# Patient Record
Sex: Male | Born: 1950
Health system: Southern US, Community
[De-identification: ages and names within clinical notes are randomized; demographics above are authoritative.]

## PROBLEM LIST (undated history)

## (undated) DIAGNOSIS — I482 Chronic atrial fibrillation, unspecified: Secondary | ICD-10-CM

## (undated) DIAGNOSIS — E663 Overweight: Secondary | ICD-10-CM

## (undated) DIAGNOSIS — M479 Spondylosis, unspecified: Secondary | ICD-10-CM

## (undated) DIAGNOSIS — M109 Gout, unspecified: Secondary | ICD-10-CM

## (undated) DIAGNOSIS — Z7901 Long term (current) use of anticoagulants: Secondary | ICD-10-CM

## (undated) DIAGNOSIS — E785 Hyperlipidemia, unspecified: Secondary | ICD-10-CM

## (undated) DIAGNOSIS — M1120 Other chondrocalcinosis, unspecified site: Secondary | ICD-10-CM

## (undated) HISTORY — DX: Overweight: E66.3

## (undated) HISTORY — DX: Spondylosis, unspecified: M47.9

## (undated) HISTORY — DX: Chronic atrial fibrillation, unspecified: I48.20

## (undated) HISTORY — DX: Other chondrocalcinosis, unspecified site: M11.20

## (undated) HISTORY — DX: Hyperlipidemia, unspecified: E78.5

## (undated) HISTORY — DX: Long term (current) use of anticoagulants: Z79.01

---

## 1999-12-11 DIAGNOSIS — I482 Chronic atrial fibrillation, unspecified: Secondary | ICD-10-CM

## 1999-12-11 HISTORY — DX: Chronic atrial fibrillation, unspecified: I48.20

## 2004-12-10 HISTORY — PX: COLONOSCOPY W/ POLYPECTOMY: SHX1380

## 2006-07-01 ENCOUNTER — Emergency Department (HOSPITAL_COMMUNITY): Admission: EM | Admit: 2006-07-01 | Discharge: 2006-07-01 | Payer: Self-pay | Admitting: Emergency Medicine

## 2006-07-05 ENCOUNTER — Ambulatory Visit (HOSPITAL_COMMUNITY): Admission: RE | Admit: 2006-07-05 | Discharge: 2006-07-05 | Payer: Self-pay | Admitting: Family Medicine

## 2007-07-15 ENCOUNTER — Ambulatory Visit: Payer: Self-pay | Admitting: Cardiology

## 2007-08-01 ENCOUNTER — Ambulatory Visit: Payer: Self-pay | Admitting: Cardiology

## 2007-08-01 ENCOUNTER — Ambulatory Visit (HOSPITAL_COMMUNITY): Admission: RE | Admit: 2007-08-01 | Discharge: 2007-08-01 | Payer: Self-pay | Admitting: Cardiology

## 2007-08-22 ENCOUNTER — Ambulatory Visit: Payer: Self-pay | Admitting: Cardiology

## 2008-04-21 ENCOUNTER — Ambulatory Visit: Payer: Self-pay | Admitting: Cardiology

## 2008-04-21 ENCOUNTER — Ambulatory Visit (HOSPITAL_COMMUNITY): Admission: RE | Admit: 2008-04-21 | Discharge: 2008-04-21 | Payer: Self-pay | Admitting: Cardiology

## 2008-04-26 ENCOUNTER — Ambulatory Visit: Payer: Self-pay | Admitting: Internal Medicine

## 2009-04-14 ENCOUNTER — Ambulatory Visit: Payer: Self-pay | Admitting: Cardiology

## 2009-04-18 ENCOUNTER — Ambulatory Visit (HOSPITAL_COMMUNITY): Admission: RE | Admit: 2009-04-18 | Discharge: 2009-04-18 | Payer: Self-pay | Admitting: Cardiology

## 2009-04-22 ENCOUNTER — Ambulatory Visit: Payer: Self-pay | Admitting: Cardiology

## 2009-05-05 ENCOUNTER — Ambulatory Visit: Payer: Self-pay | Admitting: Cardiology

## 2009-06-09 ENCOUNTER — Encounter: Payer: Self-pay | Admitting: Cardiology

## 2009-07-04 ENCOUNTER — Encounter: Payer: Self-pay | Admitting: Cardiology

## 2009-07-04 LAB — CONVERTED CEMR LAB
INR: 2.2 — ABNORMAL HIGH (ref 0.0–1.5)
Prothrombin Time: 25.5 s — ABNORMAL HIGH (ref 11.6–15.2)

## 2009-07-25 ENCOUNTER — Encounter: Payer: Self-pay | Admitting: *Deleted

## 2009-08-01 ENCOUNTER — Encounter (INDEPENDENT_AMBULATORY_CARE_PROVIDER_SITE_OTHER): Payer: Self-pay | Admitting: *Deleted

## 2009-08-01 LAB — CONVERTED CEMR LAB
OCCULT 1: NEGATIVE
OCCULT 2: NEGATIVE
OCCULT 3: NEGATIVE

## 2009-08-22 ENCOUNTER — Encounter: Payer: Self-pay | Admitting: Cardiology

## 2009-08-22 LAB — CONVERTED CEMR LAB: INR: 2.9 — ABNORMAL HIGH (ref 0.0–1.5)

## 2009-08-23 ENCOUNTER — Encounter: Payer: Self-pay | Admitting: Cardiology

## 2009-08-23 LAB — CONVERTED CEMR LAB: POC INR: 2.9

## 2009-08-24 ENCOUNTER — Encounter: Payer: Self-pay | Admitting: Cardiology

## 2009-09-06 ENCOUNTER — Encounter: Payer: Self-pay | Admitting: Cardiology

## 2009-09-12 ENCOUNTER — Encounter: Payer: Self-pay | Admitting: Cardiology

## 2009-09-14 ENCOUNTER — Encounter (INDEPENDENT_AMBULATORY_CARE_PROVIDER_SITE_OTHER): Payer: Self-pay | Admitting: *Deleted

## 2009-09-14 ENCOUNTER — Encounter: Payer: Self-pay | Admitting: Cardiology

## 2009-09-14 ENCOUNTER — Telehealth: Payer: Self-pay | Admitting: Cardiology

## 2009-09-14 LAB — CONVERTED CEMR LAB
ALT: 19 units/L
AST: 25 units/L
Albumin: 4.4 g/dL (ref 3.5–5.2)
Alkaline Phosphatase: 56 units/L
Alkaline Phosphatase: 56 units/L (ref 39–117)
Bilirubin, Direct: 0.2 mg/dL
Bilirubin, Direct: 0.2 mg/dL (ref 0.0–0.3)
Cholesterol: 175 mg/dL (ref 0–200)
HDL: 44 mg/dL
HDL: 44 mg/dL (ref >39)
INR: 2.7 — ABNORMAL HIGH (ref 0.0–1.5)
INR: 2.7
LDL Cholesterol: 109 mg/dL
LDL Cholesterol: 109 mg/dL — ABNORMAL HIGH (ref 0–99)
Prothrombin Time: 28.9 s
Total CHOL/HDL Ratio: 4
Triglycerides: 112 mg/dL (ref <150)
VLDL: 22 mg/dL (ref 0–40)

## 2009-10-25 ENCOUNTER — Encounter (INDEPENDENT_AMBULATORY_CARE_PROVIDER_SITE_OTHER): Payer: Self-pay | Admitting: *Deleted

## 2009-10-25 ENCOUNTER — Encounter: Payer: Self-pay | Admitting: Cardiology

## 2009-10-25 ENCOUNTER — Telehealth: Payer: Self-pay | Admitting: Cardiology

## 2009-10-25 LAB — CONVERTED CEMR LAB: Prothrombin Time: 27.8 s — ABNORMAL HIGH (ref 11.6–15.2)

## 2009-12-16 ENCOUNTER — Encounter: Payer: Self-pay | Admitting: Cardiology

## 2009-12-16 LAB — CONVERTED CEMR LAB: INR: 2.67 — ABNORMAL HIGH (ref <1.50)

## 2009-12-19 ENCOUNTER — Telehealth: Payer: Self-pay | Admitting: Cardiology

## 2009-12-19 ENCOUNTER — Encounter: Payer: Self-pay | Admitting: Cardiology

## 2010-01-19 LAB — CONVERTED CEMR LAB: INR: 2.68 — ABNORMAL HIGH (ref <1.50)

## 2010-01-20 ENCOUNTER — Encounter: Payer: Self-pay | Admitting: Cardiology

## 2010-01-20 ENCOUNTER — Telehealth: Payer: Self-pay | Admitting: Cardiology

## 2010-02-05 ENCOUNTER — Emergency Department (HOSPITAL_COMMUNITY): Admission: EM | Admit: 2010-02-05 | Discharge: 2010-02-05 | Payer: Self-pay | Admitting: Emergency Medicine

## 2010-02-07 ENCOUNTER — Ambulatory Visit (HOSPITAL_COMMUNITY)
Admission: RE | Admit: 2010-02-07 | Discharge: 2010-02-07 | Payer: Self-pay | Source: Home / Self Care | Admitting: Internal Medicine

## 2010-02-16 ENCOUNTER — Encounter (INDEPENDENT_AMBULATORY_CARE_PROVIDER_SITE_OTHER): Payer: Self-pay | Admitting: *Deleted

## 2010-02-17 ENCOUNTER — Encounter: Payer: Self-pay | Admitting: Adult Health

## 2010-02-17 ENCOUNTER — Ambulatory Visit: Payer: Self-pay | Admitting: Cardiology

## 2010-02-17 DIAGNOSIS — F411 Generalized anxiety disorder: Secondary | ICD-10-CM | POA: Insufficient documentation

## 2010-04-06 ENCOUNTER — Encounter (INDEPENDENT_AMBULATORY_CARE_PROVIDER_SITE_OTHER): Payer: Self-pay | Admitting: Pharmacist

## 2010-04-21 ENCOUNTER — Encounter: Payer: Self-pay | Admitting: *Deleted

## 2010-04-21 ENCOUNTER — Encounter: Payer: Self-pay | Admitting: Cardiology

## 2010-04-21 ENCOUNTER — Telehealth (INDEPENDENT_AMBULATORY_CARE_PROVIDER_SITE_OTHER): Payer: Self-pay | Admitting: *Deleted

## 2010-04-21 LAB — CONVERTED CEMR LAB
INR: 3.42 — ABNORMAL HIGH (ref <1.50)
INR: 3.42

## 2010-06-08 ENCOUNTER — Encounter (INDEPENDENT_AMBULATORY_CARE_PROVIDER_SITE_OTHER): Payer: Self-pay | Admitting: Pharmacist

## 2010-06-19 ENCOUNTER — Encounter: Payer: Self-pay | Admitting: Cardiology

## 2010-06-19 ENCOUNTER — Telehealth: Payer: Self-pay | Admitting: Cardiology

## 2010-06-19 LAB — CONVERTED CEMR LAB
INR: 2.87 — ABNORMAL HIGH (ref <1.50)
INR: 2.87
Prothrombin Time: 30.5 s — ABNORMAL HIGH (ref 11.6–15.2)

## 2010-08-17 ENCOUNTER — Encounter: Payer: Self-pay | Admitting: Cardiology

## 2010-08-17 ENCOUNTER — Telehealth: Payer: Self-pay | Admitting: Cardiology

## 2010-08-17 LAB — CONVERTED CEMR LAB: Prothrombin Time: 23.2 s

## 2010-08-31 ENCOUNTER — Encounter: Payer: Self-pay | Admitting: Adult Health

## 2010-08-31 ENCOUNTER — Ambulatory Visit: Payer: Self-pay | Admitting: Cardiology

## 2010-09-18 ENCOUNTER — Telehealth (INDEPENDENT_AMBULATORY_CARE_PROVIDER_SITE_OTHER): Payer: Self-pay

## 2010-10-31 LAB — CONVERTED CEMR LAB: INR: 3.05 — ABNORMAL HIGH (ref <1.50)

## 2010-11-03 ENCOUNTER — Encounter: Payer: Self-pay | Admitting: Cardiology

## 2010-11-03 LAB — CONVERTED CEMR LAB: POC INR: 3.05

## 2010-12-28 LAB — CONVERTED CEMR LAB
INR: 2.59 — ABNORMAL HIGH (ref <1.50)
Prothrombin Time: 27.9 s — ABNORMAL HIGH (ref 11.6–15.2)

## 2010-12-29 ENCOUNTER — Encounter: Payer: Self-pay | Admitting: Cardiology

## 2010-12-29 ENCOUNTER — Telehealth: Payer: Self-pay | Admitting: Cardiology

## 2011-01-01 ENCOUNTER — Ambulatory Visit (HOSPITAL_COMMUNITY)
Admission: RE | Admit: 2011-01-01 | Discharge: 2011-01-01 | Payer: Self-pay | Source: Home / Self Care | Attending: Cardiology | Admitting: Cardiology

## 2011-01-01 ENCOUNTER — Ambulatory Visit
Admission: RE | Admit: 2011-01-01 | Discharge: 2011-01-01 | Payer: Self-pay | Source: Home / Self Care | Attending: Adult Health | Admitting: Adult Health

## 2011-01-02 ENCOUNTER — Encounter: Payer: Self-pay | Admitting: Adult Health

## 2011-01-05 ENCOUNTER — Encounter (INDEPENDENT_AMBULATORY_CARE_PROVIDER_SITE_OTHER): Payer: Self-pay | Admitting: *Deleted

## 2011-01-05 LAB — CONVERTED CEMR LAB
ALT: 19 units/L
Albumin: 4.3 g/dL
Calcium: 9.3 mg/dL
Chloride: 105 meq/L
Cholesterol: 163 mg/dL
Potassium: 4.3 meq/L
Triglycerides: 141 mg/dL

## 2011-01-08 LAB — CONVERTED CEMR LAB
AST: 20 units/L (ref 0–37)
Albumin: 4.3 g/dL (ref 3.5–5.2)
Alkaline Phosphatase: 67 units/L (ref 39–117)
CO2: 26 meq/L (ref 19–32)
Chloride: 105 meq/L (ref 96–112)
Creatinine, Ser: 0.87 mg/dL (ref 0.40–1.50)
Glucose, Bld: 99 mg/dL (ref 70–99)
LDL Cholesterol: 102 mg/dL — ABNORMAL HIGH (ref 0–99)
Magnesium: 1.8 mg/dL (ref 1.5–2.5)
Potassium: 4.3 meq/L (ref 3.5–5.3)
Total Bilirubin: 0.9 mg/dL (ref 0.3–1.2)
Total CHOL/HDL Ratio: 4.9
Total Protein: 7 g/dL (ref 6.0–8.3)

## 2011-01-09 NOTE — Progress Notes (Signed)
Summary: coumadin management  Phone Note Outgoing Call   Call placed by: Vashti Hey RN Call placed to: Patient Reason for Call: Discuss lab or test results Summary of Call: Received fax from [facility] with results of PT/INR obtained on pt today.  PT 34.2  INR 3.42  Pt states he was on prednisone x 7 days for gout and percocet x 7 days after that for his back.  Is off both meds now.  Pt has already taken coumadin today.  Pt will hold coumadin tomorrow then resume 2.5mg  once daily except 5mg  on M,W,F.  He will recheck INR at [facility] week of 05/15/10. Initial call taken by: Vashti Hey RN,  Apr 21, 2010 3:47 PM     Anticoagulant Therapy  Managed by: Vashti Hey, RN PCP: Osborne Casco Supervising MD: Myrtis Ser MD, Tinnie Gens Indication 1: Atrial Fibrillation (ICD-427.31) Lab Used: [facility] Site: [place] PT 34.2  Dietary changes: no    Health status changes: no    Bleeding/hemorrhagic complications: no    Recent/future hospitalizations: no    Any changes in medication regimen? yes       Details: been on prednisone x 7 days for gout and percocet x 7 days for back  Recent/future dental: no  Any missed doses?: no       Is patient compliant with meds? yes         Anticoagulation Management History:      The patient is taking warfarin and comes in today for a routine follow up visit.  Negative risk factors for bleeding include an age less than 39 years old.  The bleeding index is 'low risk'.  Negative CHADS2 values include Age > 75 years old.  The start date was 12/11/1999.  His last INR was 2.68 and today's INR is 3.42.  Prothrombin time is 34.2.  Anticoagulation responsible provider: Myrtis Ser MD, Tinnie Gens.  Cuvette Lot#: [id number].    Anticoagulation Management Assessment/Plan:      The patient's current anticoagulation dose is Warfarin sodium 5 mg tabs: Use as directed by Anticoagulation Clinic.1 tablet by mouth daily.  The target INR is 2 - 3.  The next INR is due 05/15/2010.   Anticoagulation instructions were given to patient.  Results were reviewed/authorized by Vashti Hey, RN.  He was notified by Vashti Hey RN.         Prior Anticoagulation Instructions: Received fax from [facility] with results of PT/INR obtained on pt 01/19/10.  PT 28.3  INR 2.68  Pt told to continue coumadin 2.5mg  once daily except 5mg  on M,W,F and have INR rechecked at lab week of 02/20/10.  Pt verbalized understanding.  Current Anticoagulation Instructions: Received fax from [facility] with results of PT/INR obtained on pt today.  PT 34.2  INR 3.42  Pt states he was on prednisone x 7 days for gout and percocet x 7 days after that for his back.  Is off both meds now.  Pt has already taken coumadin today.  Pt will hold coumadin tomorrow then resume 2.5mg  once daily except 5mg  on M,W,F.  He will recheck INR at [facility] week of 05/15/10.

## 2011-01-09 NOTE — Progress Notes (Signed)
Summary: coumadin management  Phone Note Outgoing Call   Call placed by: Vashti Hey RN,  August 17, 2010 4:33 PM Call placed to: Patient Reason for Call: Discuss lab or test results Summary of Call: Received fax from [facility] with results of PT/INR obtained on pt today.  PT 23.2  INR 2.04  Order gievn for pt to take coumadin 5mg  tonight then resume 2.5mg  once daily except 5mg  on M,W,F.  Recheck INR week of 09/18/10 at [facility].  Pt verbalized understanding.     Anticoagulant Therapy  Managed by: Vashti Hey, RN PCP: Osborne Casco Supervising MD: Dietrich Pates MD, Molly Maduro Indication 1: Atrial Fibrillation (ICD-427.31) Lab Used: [facility] Site: [place] PT 23.2  Dietary changes: no    Health status changes: no    Bleeding/hemorrhagic complications: no    Recent/future hospitalizations: no    Any changes in medication regimen? no    Recent/future dental: no  Any missed doses?: no       Is patient compliant with meds? yes         Anticoagulation Management History:      His anticoagulation is being managed by telephone today.  Negative risk factors for bleeding include an age less than 21 years old.  The bleeding index is 'low risk'.  Negative CHADS2 values include Age > 71 years old.  The start date was 12/11/1999.  His last INR was 2.87 and today's INR is 2.04.  Prothrombin time is 23.2.  Anticoagulation responsible provider: Dietrich Pates MD, Molly Maduro.  Cuvette Lot#: [id number].    Anticoagulation Management Assessment/Plan:      The patient's current anticoagulation dose is Warfarin sodium 5 mg tabs: Use as directed by Anticoagulation Clinic.1 tablet by mouth daily.  The target INR is 2 - 3.  The next INR is due 09/18/2010.  Anticoagulation instructions were given to patient.  Results were reviewed/authorized by Vashti Hey, RN.  He was notified by Vashti Hey RN.         Prior Anticoagulation Instructions: Received fax from [place] with results of PT/INR obtained on pt today.  PT  30.5  INR 2.87  Pt told to continue coumadin 2.5mg  once daily except 5mg  on M,W,F and recheck INR week of 07/17/10 at [facility].  Current Anticoagulation Instructions: Received fax from [facility] with results of PT/INR obtained on pt today.  PT 23.2  INR 2.04  Order gievn for pt to take coumadin 5mg  tonight then resume 2.5mg  once daily except 5mg  on M,W,F.  Recheck INR week of 09/18/10 at [facility].  Pt verbalized understanding.

## 2011-01-09 NOTE — Miscellaneous (Signed)
Summary: labs lipids,liver,09/14/2009  Clinical Lists Changes  Observations: Added new observation of ALBUMIN: 4.4 g/dL (84/69/6295 28:41) Added new observation of PROTEIN, TOT: 7.2 g/dL (32/44/0102 72:53) Added new observation of SGPT (ALT): 19 units/L (09/14/2009 16:00) Added new observation of SGOT (AST): 25 units/L (09/14/2009 16:00) Added new observation of ALK PHOS: 56 units/L (09/14/2009 16:00) Added new observation of BILI DIRECT: 0.2 mg/dL (66/44/0347 42:59) Added new observation of LDL: 109 mg/dL (56/38/7564 33:29) Added new observation of HDL: 44 mg/dL (51/88/4166 06:30) Added new observation of TRIGLYC TOT: 112 mg/dL (16/12/930 35:57) Added new observation of CHOLESTEROL: 175 mg/dL (32/20/2542 70:62)

## 2011-01-09 NOTE — Medication Information (Signed)
Summary: Coumadin Clinic   Anticoagulant Therapy  Managed by: Weston Brass, PharmD PCP: Osborne Casco Supervising MD: Myrtis Ser MD, Tinnie Gens Indication 1: Atrial Fibrillation (ICD-427.31) Lab Used: [facility] [place] Site: [place] INR POC 3.05 INR RANGE 2.0-3.0  Dietary changes: yes       Details: had extra wine night before INR drawn.  Health status changes: no    Bleeding/hemorrhagic complications: no    Recent/future hospitalizations: no    Any changes in medication regimen? yes       Details: taking extra IBU this week  Recent/future dental: no  Any missed doses?: no       Is patient compliant with meds? yes      Comments: INR drawn on 11/22.  Results received in CC on 11/25.   Allergies: No Known Drug Allergies  Anticoagulation Management History:      His anticoagulation is being managed by telephone today.  Negative risk factors for bleeding include an age less than 62 years old.  The bleeding index is 'low risk'.  Negative CHADS2 values include Age > 66 years old.  The start date was 12/11/1999.  His last INR was 3.05.  Anticoagulation responsible provider: Myrtis Ser MD, Tinnie Gens.  INR POC: 3.05.    Anticoagulation Management Assessment/Plan:      The patient's current anticoagulation dose is Warfarin sodium 5 mg tabs: Use as directed by Anticoagulation Clinic.1 tablet by mouth daily.  The target INR is 2 - 3.  The next INR is due 11/27/2010.  Anticoagulation instructions were given to patient.  Results were reviewed/authorized by Weston Brass, PharmD.  He was notified by Weston Brass PharmD.         Prior Anticoagulation Instructions: Received fax from [place] with results of PT/INR obtained on pt today.  PT 23.2  INR 2.04  Order gievn for pt to take coumadin 5mg  tonight then resume 2.5mg  once daily except 5mg  on M,W,F.  Recheck INR week of 09/18/10 at [facility].  Pt verbalized understanding.  Current Anticoagulation Instructions: INR 3.05  Spoke with pt.  Continue same dose of  alternating 2.5mg  and 5mg  every other day.  Recheck INR week of 11/27/10.

## 2011-01-09 NOTE — Assessment & Plan Note (Signed)
Summary: PER PT REQUEST/STATES DR.FAGAN WANTS HIM SEEN/TG  Medications Added PERCOCET 5-325 MG TABS (OXYCODONE-ACETAMINOPHEN) take as needed for pain ALPRAZOLAM 0.25 MG TABS (ALPRAZOLAM) take one tablet by mouth twice daily as needed for anxiety      Allergies Added: NKDA  Visit Type:  Follow-up Primary Provider:  Osborne Casco  CC:  no complaints today.  History of Present Illness: [name] is a friendly, talkative CM we are following for longstanding history of Atrial fibrillaion.  He was being seen on a yearly basis for CBL assessment and clearance, but has now retired.  He continues to see [place] yearly secondary to his arrythmia.  Takes comadin and medications as directed and has [facility] [place].  He states that he recently injured his back by over exertion and had MRI showing bulging discs not requiring surgery at this time.  He continues some soreness and pain down his L leg on occasion.  He has been less active as a result of this.  He cares for an invalid wife and is often very stressed by this as she is full-time care for progressive Parkasins disease.  He states he is not sleeping well at night and request a low dose of antianxiety.  He tried a "friend's" xanax .50mg  and he states that it was helpful to him. He is requesting this for short term. He denies bleeding, SOB or dizziness.    Preventive Screening-Counseling & Management  Alcohol-Tobacco     Alcohol drinks/day: 0  Current Medications (verified): 1)  Warfarin Sodium 5 Mg Tabs (Warfarin Sodium) .... Use As Directed By Anticoagulation Clinic.1 Tablet By Mouth Daily 2)  Simvastatin 40 Mg Tabs (Simvastatin) .... Take One Tablet By Mouth Daily At Bedtime 3)  Atenolol 50 Mg Tabs (Atenolol) .... Take One Tablet By Mouth Daily 4)  Percocet 5-325 Mg Tabs (Oxycodone-Acetaminophen) .... Take As Needed For Pain 5)  Alprazolam 0.25 Mg Tabs (Alprazolam) .... Take One Tablet By Mouth Twice Daily  As Needed For Anxiety  Allergies (verified): No Known Drug Allergies PMH-FH-SH reviewed-no changes except otherwise noted  Social History: Alcohol drinks/day:  0  Review of Systems       Sleepleness and anxiety. Chronic back pain. All other systems have been reviewed and are negative unless stated above.   Vital Signs:  Patient profile:   60 year old male Height:      66 inches Weight:      244 pounds BMI:     39.52 Pulse rate:   93 / minute BP sitting:   110 / 77  (right arm)  Vitals Entered By: Dreama Saa, CNA (February 17, 2010 2:03 PM)  Physical Exam  General:  normal appearance and obese.   Head:  normocephalic and atraumatic Eyes:  Wears glasses Ears:  TM's intact and clear with normal canals and hearing Mouth:  Teeth, gums and palate normal. Oral mucosa normal. Lungs:  CTA Heart:  Irregular without MRG Abdomen:  Obese, NT 2+ BS Msk:  Back normal, normal gait. Muscle strength and tone normal. Extremities:  Some radiculopathy with over exertion from back pain. Neurologic:  Alert and oriented x 3. Psych:  anxious and hyperactive.     EKG  Procedure date:  02/17/2010  Findings:      Atrial fibrillation with a controlled ventricular response rate of: 91bpm  Impression & Recommendations:  Problem # 1:  ATRIAL FIBRILLATION (ICD-427.31) He continues on atenolol as directed.  His HR is a little  fast today, but he denies palpatations or racing heart.  He says he is out of shape and wants to exercise more, as his back heals.  He continues on coumadin with monthly checks of INR.  He will continue same.  I will see him in 6 months for follow-up His updated medication list for this problem includes:    Warfarin Sodium 5 Mg Tabs (Warfarin sodium) ..... Use as directed by anticoagulation clinic.1 tablet by mouth daily    Atenolol 50 Mg Tabs (Atenolol) .Marland Kitchen... Take one tablet by mouth daily  Problem # 2:  DYSLIPIDEMIA (ICD-272.4) Fasting lipids and LFT's will be completed  with next blood draw.  His updated medication list for this problem includes:    Simvastatin 40 Mg Tabs (Simvastatin) .Marland Kitchen... Take one tablet by mouth daily at bedtime  Orders: T-Lipid Profile [phone]) T-Renal Function Panel [phone])  Problem # 3:  ANXIETY (ICD-300.00) I have given him a low dose of xanax 0.25mg  two times a day and is advised to follow-up with primary care provider for refills.  TSH will be checked with next blood draw.  He is encouraged to exercise daily which will assist with relaxation and decrease anxiety.  Patient Instructions: 1)  Your physician recommends that you schedule a follow-up appointment in: 6 months 2)  Your physician recommends that you return for lab work in: with next scheduled lab draw for pt/inr 3)  Your physician has recommended you make the following change in your medication: xanax Take 1 tablet by mouth two times a day 4)  as needed for anxiety Prescriptions: ALPRAZOLAM 0.25 MG TABS (ALPRAZOLAM) take one tablet by mouth twice daily as needed for anxiety  #45 x 2   Entered by:   Teressa Lower RN   Authorized by:   Joni Reining, NP   Signed by:   Teressa Lower RN on 02/17/2010   Method used:   Print then Give to Patient   RxID:   [id number]

## 2011-01-09 NOTE — Assessment & Plan Note (Signed)
Summary: 6 mth f/u per checkout on 02/17/10/tg  Medications Added RA FISH OIL 1000 MG CAPS (OMEGA-3 FATTY ACIDS) take 1 tab daily VITAMIN B-12 500 MCG TABS (CYANOCOBALAMIN) take 1 tab daily CHROMIUM 200 MCG TABS (CHROMIUM) take 1 tab daily      Allergies Added: NKDA  Visit Type:  Follow-up Primary Dillon Strickland:  Dillon Strickland  CC:  no cardiology complaints.  History of Present Illness: [name] is a very talkative 60 y/o obese CM with known history of Afib, Hypercholesterolemia, and chronic back pain.  He is without complaint today from a cardiac standpoint.  He talks mostly about caring for his wife with Parkinsons.  He denies shortness of breath or palapations. He does get tired out at night from caring for her but otherwise is concerned about his weight gain. He admits to not eating well and not exercising.  He plans to join the [facility]. He is followed by our coumadin clinic for PT-INR.  Current Medications (verified): 1)  Warfarin Sodium 5 Mg Tabs (Warfarin Sodium) .... Use As Directed By Anticoagulation Clinic.1 Tablet By Mouth Daily 2)  Simvastatin 40 Mg Tabs (Simvastatin) .... Take One Tablet By Mouth Daily At Bedtime 3)  Atenolol 50 Mg Tabs (Atenolol) .... Take One Tablet By Mouth Daily 4)  Alprazolam 0.25 Mg Tabs (Alprazolam) .... Take One Tablet By Mouth Twice Daily As Needed For Anxiety 5)  Ra Fish Oil 1000 Mg Caps (Omega-3 Fatty Acids) .... Take 1 Tab Daily 6)  Vitamin B-12 500 Mcg Tabs (Cyanocobalamin) .... Take 1 Tab Daily 7)  Chromium 200 Mcg Tabs (Chromium) .... Take 1 Tab Daily  Allergies (verified): No Known Drug Allergies  Review of Systems       All other systems have been reviewed and are negative unless stated above.   Vital Signs:  Patient profile:   60 year old male Weight:      250 pounds Pulse rate:   75 / minute BP sitting:   117 / 75  (right arm)  Vitals Entered By: Dreama Saa, CNA (August 31, 2010 2:15 PM)  Physical Exam  General:  Well  developed, well nourished, in no acute distress.obese.   Lungs:  Clear bilaterally to auscultation and percussion. Heart:  Irregular without MRG Abdomen:  Obese, nontender 2+ BS Msk:  Back normal, normal gait. Muscle strength and tone normal. Extremities:  No clubbing or cyanosis. Neurologic:  Alert and oriented x 3. Psych:  anxious and hyperactive.     Impression & Recommendations:  Problem # 1:  ANXIETY (ICD-300.00) He requests more Ativan at a higher dose to assist him in sleeping better and anxiety concerning his caregiver status in taking care of his wife with Parkinsons.  I have advised him to see his primary care physician for this as he may want to prescribe something different instead of ativan.  He verbalizes understanding.  Problem # 2:  ATRIAL FIBRILLATION (ICD-427.31) Heart rate is well controlled.  He will continue meds as directed.  Coumadin per clinic's evaluation of INR.  Follow-up in 6 months His updated medication list for this problem includes:    Warfarin Sodium 5 Mg Tabs (Warfarin sodium) ..... Use as directed by anticoagulation clinic.1 tablet by mouth daily    Atenolol 50 Mg Tabs (Atenolol) .Marland Kitchen... Take one tablet by mouth daily  Patient Instructions: 1)  Your physician recommends that you schedule a follow-up appointment in: 6 MONTHS  Appended Document: 6 mth f/u per checkout on 02/17/10/tg Patient of Dr. Dietrich Pates,  seen independently by Ms. Lawrence today.

## 2011-01-09 NOTE — Progress Notes (Signed)
Summary: Refills   Phone Note Call from Patient   Caller: Patient Reason for Call: Refill Medication Summary of Call: patient states that he needs refills on Simvastatin, Atenolol, and Coumadin called to [facility] / states that he had Atenolol filled but it had 0 refills on it /tg Initial call taken by: Raechel Ache [facility],  September 18, 2010 8:33 AM    Prescriptions: ATENOLOL 50 MG TABS (ATENOLOL) Take one tablet by mouth daily  #30 x 5   Entered by:   Larita Fife Via LPN   Authorized by:   Kathlen Brunswick, MD, [facility]   Signed by:   Larita Fife Via LPN on 16/09/9603   Method used:   Electronically to        [facility] (retail)       [address] [place], [place]  [address]       Ph: [phone]       Fax: [phone]   RxID:   [id number] SIMVASTATIN 40 MG TABS (SIMVASTATIN) Take one tablet by mouth daily at bedtime  #30 x 5   Entered by:   Larita Fife Via LPN   Authorized by:   Kathlen Brunswick, MD, [facility]   Signed by:   Larita Fife Via LPN on 10/06/2535   Method used:   Electronically to        [facility]* (retail)       [address] [place], [place]  [address]       Ph: [phone]       Fax: [phone]   RxID:   [id number] WARFARIN SODIUM 5 MG TABS (WARFARIN SODIUM) Use as directed by Anticoagulation Clinic.1 tablet by mouth daily  #60 x 2   Entered by:   Larita Fife Via LPN   Authorized by:   Kathlen Brunswick, MD, [facility]   Signed by:   Larita Fife Via LPN on 32/35/5732   Method used:   Electronically to        [facility]* (retail)       [address]       [place], [place]  [address]       Ph: [phone]       Fax: [phone]   RxID:   [id number]

## 2011-01-09 NOTE — Letter (Signed)
Summary: Custom - Delinquent Coumadin 1  [facility]  [address], [place] [address]   Phone: [phone]  Fax: [phone]     June 08, 2010 MRN: [id number]   Dillon Strickland [address] [place], [place]  [address]   Dear [name],  This letter is being sent to you as a reminder that it is necessary for you to get your INR/PT checked regularly so that we can optimize your care.  Our records indicate that you were scheduled to have a test done recently.  As of today, we have not received the results of this test.  It is very important that you have your INR checked.  Please call our office at the number listed above to schedule an appointment at your earliest convenience.    If you have recently had your protime checked or have discontinued this medication, please contact our office at the above phone number to clarify this issue.  Thank you for this prompt attention to this important health care matter.  Sincerely, Vashti Hey RN  [facility] Cardiovascular Risk Reduction [facility] Team

## 2011-01-09 NOTE — Progress Notes (Signed)
Summary: coumadin management  Phone Note Outgoing Call   Call placed by: Vashti Hey RN Call placed to: Patient Reason for Call: Discuss lab or test results Summary of Call: Received fax from [facility] with results of PT/INR obtained on pt 01/19/10.  PT 28.3  INR 2.68  Pt told to continue coumadin 2.5mg  once daily except 5mg  on M,W,F and have INR rechecked at lab week of 02/20/10.  Pt verbalized understanding.      Anticoagulation Management History:      His anticoagulation is being managed by telephone today.  Negative risk factors for bleeding include an age less than 65 years old.  The bleeding index is 'low risk'.  Negative CHADS2 values include Age > 70 years old.  The start date was 12/11/1999.  His last INR was 2.67 and today's INR is 2.68.  Prothrombin time is 28.3.  Anticoagulation responsible provider: Wall MD,Thomas.    Anticoagulation Management Assessment/Plan:      The patient's current anticoagulation dose is Warfarin sodium 5 mg tabs: Use as directed by Anticoagulation Clinic.1 tablet by mouth daily.  The target INR is 2 - 3.  The next INR is due 02/20/2010.  Anticoagulation instructions were given to patient.  Results were reviewed/authorized by Vashti Hey, RN.  He was notified by Vashti Hey RN.         Prior Anticoagulation Instructions: Received results of lab work drawn at [facility] 12/16/09.  PT 28.2  INR 2.67  Order given for pt to continue coumadin 2.5mg  once daily except 5mg  on M,W,F and repeat lab work at [facility] in 1 month.  Current Anticoagulation Instructions: Received fax from [facility] with results of PT/INR obtained on pt 01/19/10.  PT 28.3  INR 2.68  Pt told to continue coumadin 2.5mg  once daily except 5mg  on M,W,F and have INR rechecked at lab week of 02/20/10.  Pt verbalized understanding.

## 2011-01-09 NOTE — Progress Notes (Signed)
Summary: coumadin management  Phone Note Outgoing Call   Call placed by: Vashti Hey RN Call placed to: Patient Reason for Call: Discuss lab or test results Summary of Call: Received fax from [facility] with results of PT/INR obtained on pt today.  PT 30.5  INR 2.87  Pt told to continue coumadin 2.5mg  once daily except 5mg  on M,W,F and recheck INR week of 07/17/10 at [facility]. Initial call taken by: Vashti Hey RN,  June 19, 2010 4:16 PM      Anticoagulation Management History:      His anticoagulation is being managed by telephone today.  Negative risk factors for bleeding include an age less than 30 years old.  The bleeding index is 'low risk'.  Negative CHADS2 values include Age > 34 years old.  The start date was 12/11/1999.  His last INR was 3.42 and today's INR is 2.87.  Prothrombin time is 30.5.  Anticoagulation responsible provider: Dietrich Pates MD, Molly Maduro.    Anticoagulation Management Assessment/Plan:      The patient's current anticoagulation dose is Warfarin sodium 5 mg tabs: Use as directed by Anticoagulation Clinic.1 tablet by mouth daily.  The target INR is 2 - 3.  The next INR is due 07/17/2010.  Anticoagulation instructions were given to patient.  Results were reviewed/authorized by Vashti Hey, RN.  He was notified by Vashti Hey RN.         Prior Anticoagulation Instructions: Received fax from [place] with results of PT/INR obtained on pt today.  PT 34.2  INR 3.42  Pt states he was on prednisone x 7 days for gout and percocet x 7 days after that for his back.  Is off both meds now.  Pt has already taken coumadin today.  Pt will hold coumadin tomorrow then resume 2.5mg  once daily except 5mg  on M,W,F.  He will recheck INR at [facility] week of 05/15/10.  Current Anticoagulation Instructions: Received fax from [facility] with results of PT/INR obtained on pt today.  PT 30.5  INR 2.87  Pt told to continue coumadin 2.5mg  once daily except 5mg  on M,W,F and recheck INR week of 07/17/10 at  [facility].   Anticoagulant Therapy  Managed by: Vashti Hey, RN PCP: Osborne Casco Supervising MD: Dietrich Pates MD, Molly Maduro Indication 1: Atrial Fibrillation (ICD-427.31) Lab Used: [facility] Site: [place] PT 30.5  Dietary changes: no    Health status changes: no    Bleeding/hemorrhagic complications: no    Recent/future hospitalizations: no    Any changes in medication regimen? no    Recent/future dental: no  Any missed doses?: no       Is patient compliant with meds? yes

## 2011-01-09 NOTE — Letter (Signed)
Summary: Custom - Delinquent Coumadin 1  [facility]  [address] [place] [address]   Phone: [phone]  Fax: [phone]     April 06, 2010 MRN: [id number]   QUSAI KEM [address] [place], [place]  [address]   Dear Mr. Ellerbe,  This letter is being sent to you as a reminder that it is necessary for you to get your INR/PT checked regularly so that we can optimize your care.  Our records indicate that you were scheduled to have a test done recently.  As of today, we have not received the results of this test.  It is very important that you have your INR checked.  Please call our office at the number listed above to schedule an appointment at your earliest convenience.    If you have recently had your protime checked or have discontinued this medication, please contact our office at the above phone number to clarify this issue.  Thank you for this prompt attention to this important health care matter.  Sincerely, Vashti Hey, RN   [facility]

## 2011-01-09 NOTE — Progress Notes (Signed)
Summary: coumadin management  Phone Note Outgoing Call   Call placed by: Vashti Hey RN Call placed to: Patient Reason for Call: Discuss lab or test results Summary of Call: Received results of lab work drawn at [facility] 12/16/09.  PT 28.2  INR 2.67  Order given for pt to continue coumadin 2.5mg  once daily except 5mg  on M,W,F and repeat lab work at [facility] in 1 month. Initial call taken by: Vashti Hey RN,  December 19, 2009 2:37 PM      Anticoagulation Management History:      The patient is taking warfarin and comes in today for a routine follow up visit.  Negative risk factors for bleeding include an age less than 9 years old.  The bleeding index is 'low risk'.  Negative CHADS2 values include Age > 19 years old.  The start date was 12/11/1999.  His last INR was 2.54.  Prothrombin time is 28.2.  Anticoagulation responsible provider: Sofie Schendel MD,Yechiel Erny.  INR POC: 2.67.  Cuvette Lot#: [id number].    Anticoagulation Management Assessment/Plan:      The patient's current anticoagulation dose is Warfarin sodium 5 mg tabs: Use as directed by Anticoagulation Clinic.1 tablet by mouth daily.  The target INR is 2 - 3.  The next INR is due 01/23/2010.  Anticoagulation instructions were given to patient.  Results were reviewed/authorized by Vashti Hey, RN.  He was notified by Vashti Hey RN.         Prior Anticoagulation Instructions: INR 2.54 Continue coumadin 2.5mg  once daily except 5mg  on M,W,F  Current Anticoagulation Instructions: Received results of lab work drawn at [facility] 12/16/09.  PT 28.2  INR 2.67  Order given for pt to continue coumadin 2.5mg  once daily except 5mg  on M,W,F and repeat lab work at [facility] in 1 month.

## 2011-01-11 NOTE — Assessment & Plan Note (Signed)
Summary: ROV THINK HE MAY BE IN A FIB  Medications Added VITAMIN C 500 MG CHEW (ASCORBIC ACID) take 1 tab daily VITAMIN D 400 UNIT TABS (CHOLECALCIFEROL) take 1 tab daily      Allergies Added: NKDA  Visit Type:  Follow-up Primary Provider:  Osborne Casco   History of Present Illness: Dillon Strickland is a talkative 60 y/o obese CM who is here today with complaints of tingling in his lower extremities and arms with restlessness and mild discomfort in the legs and arms. He denies chest pain, or shortness of breath. We are treating him for atrial fib and he is followed by [place] with outside lab draws for PT and INR.  He complains of lack of sleep and anxiety for which he takes alprazolam as needed that is provided by Dr. Ouida Sills.  He recently was able to get some sleep when he took aprazolam and nyquil.  Once he slept he no longer had complaints of the arm and leg tingling.  His wife insisted that he be seen for cardiac evaluation to ascertain if this was related to his atrial fib.  Current Medications (verified): 1)  Warfarin Sodium 5 Mg Tabs (Warfarin Sodium) .... Use As Directed By Anticoagulation Clinic.1 Tablet By Mouth Daily 2)  Simvastatin 40 Mg Tabs (Simvastatin) .... Take One Tablet By Mouth Daily At Bedtime 3)  Atenolol 50 Mg Tabs (Atenolol) .... Take One Tablet By Mouth Daily 4)  Alprazolam 0.25 Mg Tabs (Alprazolam) .... Take One Tablet By Mouth Twice Daily As Needed For Anxiety 5)  Ra Fish Oil 1000 Mg Caps (Omega-3 Fatty Acids) .... Take 1 Tab Daily 6)  Vitamin B-12 500 Mcg Tabs (Cyanocobalamin) .... Take 1 Tab Daily 7)  Chromium 200 Mcg Tabs (Chromium) .... Take 1 Tab Daily 8)  Vitamin C 500 Mg Chew (Ascorbic Acid) .... Take 1 Tab Daily 9)  Vitamin D 400 Unit Tabs (Cholecalciferol) .... Take 1 Tab Daily  Allergies (verified): No Known Drug Allergies  Comments:  Nurse/Medical Assistant: patient brought meds he uses [place] [facility]   Review of Systems       All other systems have  been reviewed and are negative unless stated above.   Vital Signs:  Patient profile:   60 year old male Weight:      248 pounds BMI:     40.17 Pulse rate:   109 / minute BP sitting:   125 / 80  (left arm)  Vitals Entered By: Dreama Saa, CNA (January 01, 2011 1:59 PM)  Physical Exam  General:  normal appearance and obese.   Head:  normocephalic and atraumatic Eyes:  PERRLA/EOM intact; conjunctiva and lids normal. Lungs:  Clear bilaterally to auscultation and percussion. Heart:  Irregular without MRG. Pulses are palpable, no carotid bruits.   Abdomen:  Obese with 2+BS Msk:  I have checked resistance of legs while lying flat and on his side for weakness. He appears to have some weakness on the right, compared to the left, but not significant. Pulses:  pulses normal in all 4 extremities Extremities:  No clubbing or cyanosis. Neurologic:  Alert and oriented x 3. Psych:  anxious and hyperactive.     EKG  Procedure date:  01/01/2011  Findings:      Atrial fibrillation with a controlled ventricular response rate of: 87 bpm  Impression & Recommendations:  Problem # 1:  MUSCLE PAIN (ICD-729.1) I have ordered spinal series as this may be related to DJD or arthritis.  Will defer further treatment  and follow-up to Dr. Ouida Sills for more testing or diagnostic testing at his discretion. Will check labs for electrolyte imbalance in the setting of tingling and restlessness in his extremities. Orders: T-Magnesium ([id number]) T-Cervical Spine Comp 4 Views (72050TC) T-Lumbar Spine Complete, 5 Views (71110TC)  Problem # 2:  ATRIAL FIBRILLATION (ICD-427.31) Assessment: Unchanged  His updated medication list for this problem includes:    Warfarin Sodium 5 Mg Tabs (Warfarin sodium) ..... Use as directed by anticoagulation clinic.1 tablet by mouth daily    Atenolol 50 Mg Tabs (Atenolol) .Marland Kitchen... Take one tablet by mouth daily  Problem # 3:  DYSLIPIDEMIA (ICD-272.4) Follow-up labs for  continued evaluation. His updated medication list for this problem includes:    Simvastatin 40 Mg Tabs (Simvastatin) .Marland Kitchen... Take one tablet by mouth daily at bedtime  Orders: T-Comprehensive Metabolic Panel [id number]) T-Lipid Profile ([id number])  Problem # 4:  ANXIETY (ICD-300.00) This to to be followed and treated by Dr. Ouida Sills with medication adjustments and addtions at his discretion.  Other Orders: T-TSH [id number])  Patient Instructions: 1)  Your physician recommends that you schedule a follow-up appointment in: 1 month 2)  Your physician recommends that you return for lab work in: THIS WEEK 3)  Your physician recommends that you continue on your current medications as directed. Please refer to the Current Medication list given to you today. 4)  Your physician recommenmds that you have a Cervicle and Lumbar X-ray due to muscle pain

## 2011-01-11 NOTE — Progress Notes (Signed)
Summary: coumadin management  Phone Note Outgoing Call   Call placed by: Vashti Hey RN,  December 29, 2010 8:46 AM Call placed to: Patient Summary of Call: Received fax from [facility] with resullts of PT/INR obtained on pt 12/28/10.  PT 27.9  INR 2.59  Pt told to continue coumadin 2.5mg  alternating 5mg  and recheck INR wk of 01/29/11. Initial call taken by: Vashti Hey RN,  December 29, 2010 8:47 AM     Anticoagulant Therapy  Managed by: Vashti Hey RN PCP: Osborne Casco Supervising MD: Andee Lineman MD, Michelle Piper Indication 1: Atrial Fibrillation (ICD-427.31) Lab Used: [facility] [place] Site: [place] PT 27.9 INR RANGE 2.0-3.0  Dietary changes: no    Health status changes: no    Bleeding/hemorrhagic complications: no    Recent/future hospitalizations: no    Any changes in medication regimen? no    Recent/future dental: no  Any missed doses?: no       Is patient compliant with meds? yes         Anticoagulation Management History:      His anticoagulation is being managed by telephone today.  Negative risk factors for bleeding include an age less than 33 years old.  The bleeding index is 'low risk'.  Negative CHADS2 values include Age > 4 years old.  The start date was 12/11/1999.  His last INR was 3.05 and today's INR is 2.59.  Prothrombin time is 27.9.  Anticoagulation responsible provider: Andee Lineman MD, Michelle Piper.    Anticoagulation Management Assessment/Plan:      The patient's current anticoagulation dose is Warfarin sodium 5 mg tabs: Use as directed by Anticoagulation Clinic.1 tablet by mouth daily.  The target INR is 2 - 3.  The next INR is due 01/29/2011.  Anticoagulation instructions were given to patient.  Results were reviewed/authorized by Vashti Hey RN.  He was notified by Vashti Hey RN.         Prior Anticoagulation Instructions: INR 3.05  Spoke with pt.  Continue same dose of alternating 2.5mg  and 5mg  every other day.  Recheck INR week of 11/27/10.   Current Anticoagulation  Instructions: INR 2.59 Continue coumadin 2.5mg  alternating with 5mg  once daily  Recheck INR wk of 01/29/11

## 2011-01-12 ENCOUNTER — Encounter: Payer: Self-pay | Admitting: Cardiology

## 2011-01-12 ENCOUNTER — Telehealth (INDEPENDENT_AMBULATORY_CARE_PROVIDER_SITE_OTHER): Payer: Self-pay | Admitting: *Deleted

## 2011-01-17 NOTE — Letter (Signed)
Summary: STANDING ORDER  STANDING ORDER   Imported By: Faythe Ghee 01/12/2011 11:00:18  _____________________________________________________________________  External Attachment:    Type:   Image     Comment:   External Document

## 2011-01-17 NOTE — Progress Notes (Signed)
Summary: labs results   Phone Note Call from Patient Call back at Home Phone [phone]   Caller: pt Reason for Call: Lab or Test Results Summary of Call: please call fter noon today Initial call taken by: Faythe Ghee,  January 12, 2011 9:58 AM  Follow-up for Phone Call        pt will calll after he get out of a bath Follow-up by: Teressa Lower RN,  January 12, 2011 1:55 PM  Additional Follow-up for Phone Call Additional follow up Details #1::        lab results given, with instructions to call Dr.Fagan for an appt Additional Follow-up by: Teressa Lower RN,  January 12, 2011 2:01 PM

## 2011-01-30 ENCOUNTER — Encounter (INDEPENDENT_AMBULATORY_CARE_PROVIDER_SITE_OTHER): Payer: Self-pay | Admitting: *Deleted

## 2011-02-01 ENCOUNTER — Ambulatory Visit (INDEPENDENT_AMBULATORY_CARE_PROVIDER_SITE_OTHER): Payer: PRIVATE HEALTH INSURANCE | Admitting: Cardiology

## 2011-02-01 ENCOUNTER — Encounter: Payer: Self-pay | Admitting: Cardiology

## 2011-02-01 DIAGNOSIS — I4891 Unspecified atrial fibrillation: Secondary | ICD-10-CM

## 2011-02-06 NOTE — Miscellaneous (Signed)
Summary: labs cmp,lipids,magnesium,tsh,01/05/2011  Clinical Lists Changes  Observations: Added new observation of MAGNESIUM: 1.8 mg/dL (16/09/9603 54:09) Added new observation of CALCIUM: 9.3 mg/dL (81/19/1478 29:56) Added new observation of ALBUMIN: 4.3 g/dL (21/30/8657 84:69) Added new observation of PROTEIN, TOT: 7.0 g/dL (62/95/2841 32:44) Added new observation of SGPT (ALT): 19 units/L (01/05/2011 15:46) Added new observation of SGOT (AST): 20 units/L (01/05/2011 15:46) Added new observation of ALK PHOS: 67 units/L (01/05/2011 15:46) Added new observation of CREATININE: 0.87 mg/dL (12/12/7251 66:44) Added new observation of BUN: 13 mg/dL (03/47/4259 56:38) Added new observation of BG RANDOM: 99 mg/dL (75/64/3329 51:88) Added new observation of CO2 PLSM/SER: 26 meq/L (01/05/2011 15:46) Added new observation of CL SERUM: 105 meq/L (01/05/2011 15:46) Added new observation of K SERUM: 4.3 meq/L (01/05/2011 15:46) Added new observation of NA: 140 meq/L (01/05/2011 15:46) Added new observation of LDL: 102 mg/dL (41/66/0630 16:01) Added new observation of HDL: 33 mg/dL (09/32/3557 32:20) Added new observation of TRIGLYC TOT: 141 mg/dL (25/42/7062 37:62) Added new observation of CHOLESTEROL: 163 mg/dL (83/15/1761 60:73) Added new observation of TSH: 7.211 microintl units/mL (01/05/2011 15:46)

## 2011-02-15 NOTE — Assessment & Plan Note (Signed)
Summary:  Cardiology  Medications Added PREDNISONE 10 MG TABS (PREDNISONE) titrating down 2 days each today is last day at 4 tablets daily      Allergies Added: NKDA  Visit Type:  1 month follow up Primary Provider:  Osborne Strickland   History of Present Illness: [name] returns to the office as scheduled for continued assessment and treatment of atrial fibrillation and paresthesias of the upper extremities.  Since his last visit, his symptoms have resolved.  He continues to diet and lose weight.  He notes no palpitations, lightheadedness or syncope, but does experience intermittent feelings of warmth without frank diaphoresis.  Current Medications (verified): 1)  Warfarin Sodium 5 Mg Tabs (Warfarin Sodium) .... Use As Directed By Anticoagulation Clinic.1 Tablet By Mouth Daily 2)  Simvastatin 40 Mg Tabs (Simvastatin) .... Take One Tablet By Mouth Daily At Bedtime 3)  Atenolol 50 Mg Tabs (Atenolol) .... Take One Tablet By Mouth Daily 4)  Alprazolam 0.25 Mg Tabs (Alprazolam) .... Take One Tablet By Mouth Twice Daily As Needed For Anxiety 5)  Ra Fish Oil 1000 Mg Caps (Omega-3 Fatty Acids) .... Take 1 Tab Daily 6)  Vitamin B-12 500 Mcg Tabs (Cyanocobalamin) .... Take 1 Tab Daily 7)  Vitamin D 400 Unit Tabs (Cholecalciferol) .... Take 1 Tab Daily 8)  Prednisone 10 Mg Tabs (Prednisone) .... Titrating Down 2 Days Each Today Is Last Day At 4 Tablets Daily  Allergies (verified): No Known Drug Allergies  Past History:  PMH, FH, and Social History reviewed and updated.  Past Medical History: Atrial fibrillation-chronic: 2001 onset; spontaneous contrast on TEE Anticoagulation Hyperlipidemia Pseudogout  Past Surgical History: Colonoscopy-snare polypectomy approximately 2006.  Holter Monitor  Procedure date:  04/26/2008  Findings:       TEST INDICATION:  The patient is a 60 year old with atrial fibrillation.      HOLTER MONITOR:  The patient wore the monitor for 24  hours.  The rhythm   was atrial fibrillation, rates ranged from 56-140 beats per minute with   the average heart rate of 91 beats per minute.  There was a rare PVC   noted, one ventricular couplet, no pauses.  No symptoms were recorded.               Dillon Riffle, MD, [facility]    Review of Systems       See history of present illness.  Vital Signs:  Patient profile:   60 year old male Height:      66 inches Weight:      239 pounds O2 Sat:      95 % on Room air Pulse rate:   103 / minute BP sitting:   120 / 79  (left arm)  Vitals Entered By: Dillon Lower RN (February 01, 2011 2:55 PM)  O2 Flow:  Room air  Physical Exam  General:  Moderately overweight; well developed; no acute distress:   Neck-No JVD; no carotid bruits: Lungs-No tachypnea, no rales; no rhonchi; no wheezes: Cardiovascular-normal PMI; normal S1 and S2; irregular rhythm with apical rate of 80 bpm Abdomen-BS normal; soft and non-tender without masses or organomegaly:  Musculoskeletal-No deformities, no cyanosis or clubbing: Neurologic-Normal cranial nerves; symmetric strength and tone:  Skin-Warm, no significant lesions: Extremities-Nl distal pulses; no edema:     Impression & Recommendations:  Problem # 1:  ANTICOAGULATION (ICD-V58.61) Stable and therapeutic; CBC and stool for Hemoccult testing will be obtained.  Patient underwent colonoscopy with polypectomy approximately 5 years ago and  will contact his gastroenterologist for a repeat screening study.  Problem # 2:  ATRIAL FIBRILLATION (ICD-427.31) Heart rate is adequately controlled.  Symptoms evaluated at his last visit were unrelated to atrial fibrillation and of uncertain etiology.  C-Spine and lumbosacral spine films showed only mild degenerative disease.  Other Orders: Future Orders: T-CBC w/Diff ([id number]) ... 02/05/2011  Patient Instructions: 1)  Your physician recommends that you schedule a follow-up appointment in: 1 year 2)  Your  physician recommends that you return for lab work GN:FAOZ week 3)  Your physician has asked that you test your stool for blood. It is necessary to test 3 different stool specimens for accuracy. You will be given 3 hemoccult cards for specimen collection. For each stool specimen, place a small portion of stool sample (from 2 different areas of the stool) into the 2 squares on the card. Close card. Repeat with 2 more stool specimens. Bring the cards back to the office for testing.

## 2011-02-20 ENCOUNTER — Encounter: Payer: Self-pay | Admitting: Cardiology

## 2011-02-20 LAB — CONVERTED CEMR LAB: INR: 3.56 — ABNORMAL HIGH (ref <1.50)

## 2011-02-21 ENCOUNTER — Encounter: Payer: Self-pay | Admitting: Cardiology

## 2011-02-21 ENCOUNTER — Telehealth: Payer: Self-pay | Admitting: Cardiology

## 2011-02-21 LAB — CONVERTED CEMR LAB
INR: 3.56
Prothrombin Time: 35.6 s

## 2011-02-27 NOTE — Progress Notes (Signed)
Summary: coumadin management  Phone Note Outgoing Call   Call placed by: Vashti Hey RN,  February 21, 2011 1:22 PM Call placed to: Patient Reason for Call: Discuss lab or test results Summary of Call: Received fax from [facility] with results of PT/INR obtained on pt 02/20/11.  PT 35.6  INR 3.56  Pt is on prednisone taper and Tramadol fro gout.  Pt told to hold coumadin tonight.  He will decrease coumadin dose to 2.5mg  once daily except 5mg  on M,W,F until finished with prednisone then resume 5mg  once daily except 2.5mg  on M,W,F.  Recheck INR week on 03/26/11.  Pt verbalized understanding.     Anticoagulant Therapy  Managed by: Vashti Hey RN PCP: Osborne Casco Supervising MD: Daleen Squibb MD, Maisie Fus Indication 1: Atrial Fibrillation (ICD-427.31) Lab Used: [facility] [place] Site: [place] PT 35.6 INR RANGE 2.0-3.0  Dietary changes: no    Health status changes: yes       Details: gout  Bleeding/hemorrhagic complications: no    Recent/future hospitalizations: no    Any changes in medication regimen? yes       Details: on prednisone taper and tramadol  Recent/future dental: no  Any missed doses?: no       Is patient compliant with meds? yes         Anticoagulation Management History:      His anticoagulation is being managed by telephone today.  Negative risk factors for bleeding include an age less than 78 years old.  The bleeding index is 'low risk'.  Negative CHADS2 values include Age > 64 years old.  The start date was 12/11/1999.  His last INR was 3.56 and today's INR is 3.56.  Prothrombin time is 35.6.  Anticoagulation responsible provider: Daleen Squibb MD, Maisie Fus.    Anticoagulation Management Assessment/Plan:      The patient's current anticoagulation dose is Warfarin sodium 5 mg tabs: Use as directed by Anticoagulation Clinic.1 tablet by mouth daily.  The target INR is 2 - 3.  The next INR is due 03/26/2011.  Anticoagulation instructions were given to patient.  Results were reviewed/authorized  by Vashti Hey RN.  He was notified by Vashti Hey RN.         Prior Anticoagulation Instructions: INR 2.59 Continue coumadin 2.5mg  alternating with 5mg  once daily  Recheck INR wk of 01/29/11  Current Anticoagulation Instructions: INR 3.56 Received fax from [place] with results of PT/INR obtained on pt 02/20/11.  PT 35.6  INR 3.56  Pt is on prednisone taper and Tramadol fro gout.  Pt told to hold coumadin tonight.  He will decrease coumadin dose to 2.5mg  once daily except 5mg  on M,W,F until finished with prednisone then resume 5mg  once daily except 2.5mg  on M,W,F.  Recheck INR week on 03/26/11.  Pt verbalized understanding.

## 2011-02-28 LAB — URINALYSIS, ROUTINE W REFLEX MICROSCOPIC
Bilirubin Urine: NEGATIVE
Nitrite: NEGATIVE
Protein, ur: NEGATIVE mg/dL
Specific Gravity, Urine: 1.025 (ref 1.005–1.030)
Urobilinogen, UA: 0.2 mg/dL (ref 0.0–1.0)

## 2011-02-28 LAB — PROTIME-INR
INR: 2.72 — ABNORMAL HIGH (ref 0.00–1.49)
Prothrombin Time: 28.6 seconds — ABNORMAL HIGH (ref 11.6–15.2)

## 2011-02-28 LAB — CBC
Platelets: 232 10*3/uL (ref 150–400)
RDW: 13.8 % (ref 11.5–15.5)
WBC: 10.9 10*3/uL — ABNORMAL HIGH (ref 4.0–10.5)

## 2011-02-28 LAB — DIFFERENTIAL
Basophils Absolute: 0.2 10*3/uL — ABNORMAL HIGH (ref 0.0–0.1)
Lymphocytes Relative: 15 % (ref 12–46)
Lymphs Abs: 1.6 10*3/uL (ref 0.7–4.0)
Neutrophils Relative %: 74 % (ref 43–77)

## 2011-02-28 LAB — BASIC METABOLIC PANEL
BUN: 15 mg/dL (ref 6–23)
Calcium: 8.8 mg/dL (ref 8.4–10.5)
Creatinine, Ser: 0.91 mg/dL (ref 0.4–1.5)
GFR calc non Af Amer: >60 mL/min (ref >60)
Glucose, Bld: 113 mg/dL — ABNORMAL HIGH (ref 70–99)

## 2011-02-28 LAB — URINE CULTURE: Colony Count: NO GROWTH

## 2011-02-28 LAB — POCT CARDIAC MARKERS
CKMB, poc: 1.6 ng/mL (ref 1.0–8.0)
Myoglobin, poc: 123 ng/mL (ref 12–200)
Troponin i, poc: <0.05 ng/mL (ref 0.00–0.09)

## 2011-03-07 ENCOUNTER — Other Ambulatory Visit: Payer: Self-pay | Admitting: Cardiology

## 2011-03-07 DIAGNOSIS — O169 Unspecified maternal hypertension, unspecified trimester: Secondary | ICD-10-CM

## 2011-03-08 NOTE — Telephone Encounter (Signed)
[place] °

## 2011-03-17 ENCOUNTER — Other Ambulatory Visit: Payer: Self-pay | Admitting: Cardiology

## 2011-03-20 ENCOUNTER — Other Ambulatory Visit: Payer: Self-pay

## 2011-03-20 MED ORDER — SIMVASTATIN 40 MG PO TABS: 40.0000 mg | ORAL_TABLET | Freq: Every day | ORAL | Status: DC

## 2011-03-22 ENCOUNTER — Encounter: Payer: Self-pay | Admitting: Cardiology

## 2011-03-22 DIAGNOSIS — Z7901 Long term (current) use of anticoagulants: Secondary | ICD-10-CM

## 2011-03-22 DIAGNOSIS — I4891 Unspecified atrial fibrillation: Secondary | ICD-10-CM

## 2011-04-24 NOTE — Assessment & Plan Note (Signed)
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  He requires clearance from his cardiologist, as well  as the results of a 24-hour continuous EKG.  He has done well  symptomatically with no dyspnea, chest discomfort, lightheadedness nor  syncope.  He has not returned for a lipid profile since his lipid  lowering therapy was changed to simvastatin 40 mg daily.  His only other  medications are atenolol 50 mg daily and warfarin as directed.  Anticoagulation has been stable and therapeutic.  He does require  Celebrex intermittently for pseudogout.   PHYSICAL EXAMINATION:  GENERAL:  A pleasant gentleman in no acute  distress.  VITAL SIGNS:  The weight is to 229, 4 pounds more than last year.  Blood  pressure 110/80, heart rate 80 and irregular, respirations 16.  NECK:  No jugulovenous distention; normal carotid upstrokes without  bruits.  LUNGS:  Clear.  CARDIAC:  Normal first and second heart sounds.  ABDOMEN:  Soft and nontender; no masses; no organomegaly.  EXTREMITIES:  No edema; normal distal pulses.   IMPRESSION:  Mr. Radde is doing fine with respect to atrial  fibrillation.  We will check a CBC and stool for hemoccult in  conjunction with chronic anticoagulation.  A lipid profile will be  obtained with respect to hyperlipidemia.  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  I will see this nice gentleman again in 1 year.     Gerrit Friends. Dietrich Pates, MD, [facility]  Electronically Signed    RMR/MedQ  DD: 04/21/2008  DT: 04/21/2008  Job #: [id number]

## 2011-04-24 NOTE — Procedures (Signed)
NAMEJONATHA, GAGEN             ACCOUNT NO.:  [id number]   MEDICAL RECORD NO.:  [id number]          PATIENT TYPE:  AMB   LOCATION:  DAY                           FACILITY:  [facility]   PHYSICIAN:  Gerrit Friends. Dietrich Pates, MD, FACCDATE OF BIRTH:  [birth year]   DATE OF PROCEDURE:  DATE OF DISCHARGE:  08/01/2007                                ECHOCARDIOGRAM   CLINICAL DATA:  60 year old gentleman with atrial fibrillation.   1. Technically adequate transesophageal study.  2. Moderate-to-marked left atrial enlargement with moderate      spontaneous echocardiographic contrast in the body of the left      atrium.  Mild-to-moderate right atrial enlargement with micro      bubbles attributable to the presence of an IV catheter.  The left      atrial appendage is at the upper limit of normal to mildly      enlarged; there is moderately dense echocardiographic contrast and      extremely low-flow velocity in the appendage.  3. Normal right ventricular size and function; no RVH.  4. Normal mitral valve; very mild regurgitation.  5. Normal tricuspid valve; normal pulmonic valve and proximal      pulmonary artery.  6. Very mild thickening of a trileaflet aortic valve; very mild      insufficiency.  7. Normal proximal ascending aorta as well as descending aorta.  8. Normal internal dimension, wall thickness and regional and global      function of the left ventricle.      Gerrit Friends. Dietrich Pates, MD, [facility]  Electronically Signed     RMR/MEDQ  D:  08/01/2007  T:  08/03/2007  Job:  [id number]

## 2011-04-24 NOTE — Assessment & Plan Note (Signed)
[facility]                       [place] CARDIOLOGY OFFICE NOTE   Dillon Strickland                    MRN:          [id number]  DATE:07/15/2007                            DOB:          01/19/1951    Dillon Strickland was seen in the office at his request for chronic atrial  fibrillation.  He previously resided in the [place] area and has now  moved to [facility].  He has received care from [facility] (Dr. Tania Ade), since 2001, when he was found to have atrial  fibrillation after an episode of weakness.  DC cardioversion was  attempted, but did not produce durable sinus rhythm.  He has been  anticoagulated and treated with rate control medication.  He  subsequently was started on lipid-lowering agents, but the indication is  not entirely clear.  Otherwise he has been exceedingly healthy.  He has  never been hospitalized.  He underwent a screening colonoscopy 1-1/2  years ago.   ALLERGIES:  No known drug allergies.   CURRENT MEDICATIONS:  1. Warfarin as directed.  2. Atenolol 50 mg daily.  3. Ezetimibe 10 mg daily.  4. Lovastatin 20 mg daily.  5. Celebrex on a p.r.n. basis, on the average of once every three      days.   SOCIAL HISTORY:  Works as a Naval architect.  Walks and is quite active  around the house.  Married, with two children.  No use of tobacco  products or alcohol.   FAMILY HISTORY:  Father died at age 62, due to a myocardial infarction.  Mother is alive, but has suffered a CVA.  He has five siblings, one of  whom has atrial fibrillation.   REVIEW OF SYSTEMS/PAST MEDICAL HISTORY:  Is notable for need for  corrective lenses.  Occasional palpitations.  Gastroesophageal reflux  disease symptoms.  Arthritic pain in his knees, attributed to pseudo-  gout.  He has suffered a fracture of the tibia in the past.   PHYSICAL EXAMINATION:  GENERAL:  A pleasant, somewhat overweight  gentleman, in no acute distress.  HEENT:  Hair tied back in a pony tail.  EOMs full.  Mild bilateral  arcus.  Normal oral mucosa.  NECK:  No jugular venous distention.  Normal carotid upstrokes without  bruits.  ENDOCRINE:  No thyromegaly.  HEMATOPOIETIC:  No adenopathy.  LUNGS:  Clear.  HEART:  Irregular rhythm, normal first and second heart sounds.  ABDOMEN:  Soft, nontender.  No organomegaly.  EXTREMITIES:  No edema, normal distal pulses.  SKIN:  No significant lesions.  PSYCHIATRIC:  Alert and oriented.  Normal affect.   Electrocardiogram:  Atrial fibrillation  with controlled ventricular  response.  Low voltage in the limb leads.   Prior records were reviewed  An echocardiogram performed eight months  ago showed left ventricular dysfunction with normal left ventricular  systolic function and no significant valvular abnormalities.   A Holter monitor performed in May 2008, showed atrial fibrillation with  rates ranging between 46 and 147 beats per minute.  The mean rate was  83.  There were no significant other arrhythmias.  The  patient has never  undergone a cardiac catheterization.   IMPRESSION/PLAN:  1. [name] has longstanding atrial fibrillation which is well-      tolerated.  He has been anticoagulated since the onset of his      arrhythmia, but it is not clear that this is required.  We will      proceed with a transesophageal echocardiogram, to investigate      whether he in fact has structural heart disease, and to assess      atrial appendage flow as well as possible spontaneous      echocardiographic contrast.  If his TEE is essentially normal, we      will substitute aspirin for Warfarin.  2. Treatment of hyperlipidemia:  Has provided uncertain benefit.  We      will stop Zetia and assess a lipid profile on Lovastatin alone.      Decisions will be made about further treatment at that point.  A      chemistry profile, CBC, TSH and stool for Hemoccult testing are      also pending.  3.  Celebrex:  The potential issues with Celebrex were discussed.  We      will think about substituting a different medication after other      issues are resolved.   FOLLOWUP:  I will see this nice gentleman again in one month.     Dillon Strickland. Dietrich Pates, MD, [facility]  Electronically Signed    RMR/MedQ  DD: 07/15/2007  DT: 07/15/2007  Job #: [id number]

## 2011-04-24 NOTE — Assessment & Plan Note (Signed)
[facility]                       [place] CARDIOLOGY OFFICE NOTE   Dillon Strickland, Dillon Strickland                    MRN:          [id number]  DATE:08/22/2007                            DOB:          1951-09-08    [name] returns to the office for continued assessment and  treatment of atrial fibrillation and hyperlipidemia.  Since his last  visit he has been fine.  A transesophageal echocardiogram revealed very  low flow in the atrial appendage with substantial spontaneous contrast,  both there and in the left atrium.  There was no aortic atherosclerosis.   PHYSICAL EXAMINATION:  A pleasant gentleman in no acute distress.  The weight is 225, 3 pounds less than 1 month ago.  Blood pressure  110/80, heart rate is 70 and irregular.  Respirations 16.  NECK:  No jugular venous distension.  LUNGS:  Clear.  CARDIAC:  Irregular rhythm.  Normal 1st and 2nd heart sounds.  EXTREMITIES:  No edema, 1+ pulses.   Lipid profile is suboptimal with total cholesterol of 200, triglycerides  of 106, HDL of 46 and LDL of 133.   IMPRESSION:  Dillon Strickland has adverse transesophageal echocardiographic  findings with respect to the likelihood of thromboembolism.  Accordingly, I believe he should continue full anticoagulation  indefinitely.  His hypertension is adequately controlled.  His  hyperlipidemia is suboptimally controlled, but he does not have clear  indications for pharmacologic therapy.  Nonetheless, if he is going to  take a drug, effective drug is warranted.  We will change lovastatin to  simvastatin 40 mg daily.  I will reassess this nice gentleman in 1 year.     Gerrit Friends. Dietrich Pates, MD, [facility]  Electronically Signed    RMR/MedQ  DD: 08/22/2007  DT: 08/23/2007  Job #: [id number]

## 2011-04-24 NOTE — Assessment & Plan Note (Signed)
[facility]ALTHC[facility]                       [place] CARDIOLOGY OFFICE NOTE   Dillon, Strickland                    MRN:          [id number]  DATE:04/14/2009                            DOB:          16-Jan-1951    CARDIOLOGIST:  Gerrit Friends. Dietrich Pates, MD, [facility]   REASON FOR VISIT:  Annual followup.   HISTORY OF PRESENT ILLNESS:  [name] is a 60 year old male patient  with history of permanent atrial fibrillation on chronic Coumadin  therapy, who returns to the office today for followup.  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  He has been doing well since we last saw him  1 year ago.  He denies any chest pain, shortness of breath, significant  dyspnea with exertion, orthopnea, PND, pedal edema, syncope, or near-  syncope.  He is taking his medications well.  Unfortunately, he has been  lost to follow up in our Coumadin Clinic.  He tells me that he has been  coming to [facility] and having his blood drawn, but has not heard  back from our clinic in quite some time.  He has not had any bleeding  complications.   CURRENT MEDICATIONS:  1. Coumadin as directed.  2. Atenolol 50 mg daily.  3. Simvastatin 40 mg daily.  4. Voltaren topical p.r.n.   PHYSICAL EXAMINATION:  GENERAL:  He is a well-nourished, well-devoloped  male in no acute distress.  VITAL SIGNS:  Blood pressure is 120/90, pulse 84, and weight 238 pounds.  HEENT:  Normal.  NECK:  Without JVD.  CARDIAC:  Normal S1 and S2.  Irregularly irregular rhythm.  No  significant murmur.  LUNGS:  Clear to auscultation bilaterally.  No rales.  No wheezing.  ABDOMEN:  Soft, nontender.  No organomegaly.  EXTREMITIES:  Without clubbing, cyanosis, or edema.  SKIN:  Warm and dry.  NEUROLOGIC:  He is alert and oriented x3.  Cranial nerves II-XII are  grossly intact.  VASCULAR:  No carotid bruits noted bilaterally.   Electrocardiogram; atrial fibrillation with heart rate of 83, normal  axis, no  significant change since previous tracing.   ASSESSMENT AND PLAN:  1. Permanent atrial fibrillation.  The patient had an transesophageal      echocardiogram in August 2008 with moderate spontaneous      echocardiographic contrast in the body of left atrium.  Secondary      to this, he was maintained on chronic warfarin therapy.  His rate      seems to be well controlled on electrocardiogram today.  He is      essentially asymptomatic.  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  In      order to do this, he has to have a 24-hour Holter monitor.  We will      make sure that is arranged.  He will have CBC and stool cards      checked as part of his routine followup with his Coumadin therapy.      We will make sure that he is reestablished with our Coumadin Clinic      for routine followup.  He will have his INR checked by fingerstick      in the office today prior to leaving.  2. Dyslipidemia.  The patient will have a CMET and lipids checked to      follow up on his lipid control.   DISPOSITION:  The patient will be brought back in followup in 1 year or  sooner.      Tereso Newcomer, PA-C  Electronically Signed      Gerrit Friends. Dietrich Pates, MD, [facility]  Electronically Signed   SW/MedQ  DD: 04/14/2009  DT: 04/15/2009  Job #: [id number]

## 2011-05-21 ENCOUNTER — Other Ambulatory Visit: Payer: Self-pay | Admitting: Cardiology

## 2011-05-21 LAB — PROTIME-INR: INR: 4.14 — ABNORMAL HIGH (ref <1.50)

## 2011-05-25 ENCOUNTER — Telehealth: Payer: Self-pay | Admitting: Cardiology

## 2011-05-25 ENCOUNTER — Ambulatory Visit (INDEPENDENT_AMBULATORY_CARE_PROVIDER_SITE_OTHER): Payer: Self-pay | Admitting: *Deleted

## 2011-05-25 DIAGNOSIS — R0989 Other specified symptoms and signs involving the circulatory and respiratory systems: Secondary | ICD-10-CM

## 2011-05-25 DIAGNOSIS — I4891 Unspecified atrial fibrillation: Secondary | ICD-10-CM

## 2011-05-25 DIAGNOSIS — Z7901 Long term (current) use of anticoagulants: Secondary | ICD-10-CM

## 2011-05-25 LAB — POCT INR: INR: 4.1

## 2011-05-25 NOTE — Telephone Encounter (Signed)
Pt called for results of last lab draw completed. Pt can be reached at home phone.

## 2011-06-18 ENCOUNTER — Other Ambulatory Visit: Payer: Self-pay | Admitting: Cardiology

## 2011-06-19 ENCOUNTER — Ambulatory Visit (INDEPENDENT_AMBULATORY_CARE_PROVIDER_SITE_OTHER): Payer: Self-pay | Admitting: *Deleted

## 2011-06-19 DIAGNOSIS — R0989 Other specified symptoms and signs involving the circulatory and respiratory systems: Secondary | ICD-10-CM

## 2011-07-13 ENCOUNTER — Other Ambulatory Visit: Payer: Self-pay | Admitting: Cardiology

## 2011-10-11 ENCOUNTER — Other Ambulatory Visit: Payer: Self-pay | Admitting: Cardiology

## 2011-10-18 ENCOUNTER — Ambulatory Visit (INDEPENDENT_AMBULATORY_CARE_PROVIDER_SITE_OTHER): Payer: Self-pay | Admitting: *Deleted

## 2011-10-18 DIAGNOSIS — I4891 Unspecified atrial fibrillation: Secondary | ICD-10-CM

## 2011-10-18 DIAGNOSIS — Z7901 Long term (current) use of anticoagulants: Secondary | ICD-10-CM

## 2011-10-18 DIAGNOSIS — R0989 Other specified symptoms and signs involving the circulatory and respiratory systems: Secondary | ICD-10-CM

## 2011-11-06 ENCOUNTER — Encounter: Payer: Self-pay | Admitting: *Deleted

## 2011-11-06 DIAGNOSIS — Z7901 Long term (current) use of anticoagulants: Secondary | ICD-10-CM

## 2011-11-06 DIAGNOSIS — I4891 Unspecified atrial fibrillation: Secondary | ICD-10-CM

## 2011-11-06 NOTE — Progress Notes (Signed)
This encounter was created in error - please disregard.

## 2011-11-09 ENCOUNTER — Other Ambulatory Visit: Payer: Self-pay | Admitting: Cardiology

## 2011-11-09 LAB — PROTIME-INR
INR: 3.4 — AB (ref <=1.1)
Prothrombin Time: 21.3 seconds — ABNORMAL HIGH (ref 11.6–15.2)

## 2011-11-12 ENCOUNTER — Ambulatory Visit (INDEPENDENT_AMBULATORY_CARE_PROVIDER_SITE_OTHER): Payer: PRIVATE HEALTH INSURANCE | Admitting: *Deleted

## 2011-11-12 ENCOUNTER — Telehealth: Payer: Self-pay | Admitting: Cardiology

## 2011-11-12 DIAGNOSIS — I4891 Unspecified atrial fibrillation: Secondary | ICD-10-CM

## 2011-11-12 DIAGNOSIS — Z7901 Long term (current) use of anticoagulants: Secondary | ICD-10-CM

## 2011-11-12 NOTE — Telephone Encounter (Signed)
PT JUST CALLING TO LET YOU KNOW HE HAD INR CHECKED ON Friday AT LAB AND HAS NOT HEARD BACK YET/TMJ

## 2011-11-12 NOTE — Telephone Encounter (Signed)
Pt called.  See coumadin note. 

## 2011-11-22 ENCOUNTER — Encounter: Payer: Self-pay | Admitting: Cardiology

## 2012-01-25 ENCOUNTER — Other Ambulatory Visit: Payer: Self-pay | Admitting: Cardiology

## 2012-01-25 LAB — PROTIME-INR
INR: 2.23 — ABNORMAL HIGH (ref <1.50)
Prothrombin Time: 25.1 seconds — ABNORMAL HIGH (ref 11.6–15.2)

## 2012-01-28 ENCOUNTER — Ambulatory Visit: Payer: Self-pay | Admitting: *Deleted

## 2012-01-28 DIAGNOSIS — Z7901 Long term (current) use of anticoagulants: Secondary | ICD-10-CM

## 2012-01-28 DIAGNOSIS — I4891 Unspecified atrial fibrillation: Secondary | ICD-10-CM

## 2012-02-05 ENCOUNTER — Ambulatory Visit (INDEPENDENT_AMBULATORY_CARE_PROVIDER_SITE_OTHER): Payer: PRIVATE HEALTH INSURANCE | Admitting: Cardiology

## 2012-02-05 ENCOUNTER — Encounter: Payer: Self-pay | Admitting: Cardiology

## 2012-02-05 VITALS — BP 125/80 | HR 108 | Resp 18 | Ht 68.0 in | Wt 244.0 lb

## 2012-02-05 DIAGNOSIS — E785 Hyperlipidemia, unspecified: Secondary | ICD-10-CM | POA: Insufficient documentation

## 2012-02-05 DIAGNOSIS — I4891 Unspecified atrial fibrillation: Secondary | ICD-10-CM

## 2012-02-05 DIAGNOSIS — Z7901 Long term (current) use of anticoagulants: Secondary | ICD-10-CM | POA: Insufficient documentation

## 2012-02-05 DIAGNOSIS — Z0189 Encounter for other specified special examinations: Secondary | ICD-10-CM | POA: Insufficient documentation

## 2012-02-05 DIAGNOSIS — I482 Chronic atrial fibrillation, unspecified: Secondary | ICD-10-CM | POA: Insufficient documentation

## 2012-02-05 DIAGNOSIS — M109 Gout, unspecified: Secondary | ICD-10-CM | POA: Insufficient documentation

## 2012-02-05 NOTE — Assessment & Plan Note (Signed)
Patient has done well with stable and therapeutic anticoagulation and no notable complications.  He is overdue for repeat colonoscopy and will discuss that issue with Dr. Ouida Sills.  We will continue to monitor CBC and stool Hemoccults to exclude occult GI blood loss.

## 2012-02-05 NOTE — Assessment & Plan Note (Signed)
Lipid profile probably one year ago was good, especially in the absence of known vascular disease.  A repeat study will be obtained.

## 2012-02-05 NOTE — Patient Instructions (Signed)
Your physician recommends that you schedule a follow-up appointment in: 12 months  Your physician recommends that you return for lab work in: Today  Stools x 3 and return to office

## 2012-02-05 NOTE — Progress Notes (Signed)
Patient ID: Dillon Strickland, male   DOB: Dec 08, 1951, 61 y.o.   MRN: [id number] HPI: Scheduled return visit for this very nice gentleman with chronic atrial fibrillation.  Although his risk score for thromboembolism is 0, fairly dense spontaneous contrast was seen on transesophageal echocardiography, and I thought it prudent to maintain full anticoagulation.  He was doing well symptomatically until the past few days when he experienced substantial stress at home.  He noted left upper chest soreness without muscle tenderness and without associated symptoms.  That has now resolved.  In recent months, he has suffered numerous episodes of gout, generally in the right lower extremity, and promptly relieved with a short course of steroids.  Prior to Admission medications   Medication Sig Start Date End Date Taking? Authorizing Provider  ALPRAZolam (XANAX) 0.25 MG tablet Take 0.25 mg by mouth 2 (two) times daily as needed.     Yes Historical Provider, MD  COUMADIN 5 MG tablet TAKE ONE TABLET BY MOUTH EVERY DAY OR AS DIRECTED 07/13/11  Yes Gerrit Friends. Orell Hurtado, MD  Nutritional Supplements (MELATONIN PO) Take by mouth.   Yes Historical Provider, MD  predniSONE (DELTASONE) 10 MG tablet Take 10 mg by mouth as directed.     Yes Historical Provider, MD  simvastatin (ZOCOR) 40 MG tablet Take 1 tablet (40 mg total) by mouth at bedtime. 03/20/11  Yes Gerrit Friends. Dietrich Pates, MD  TENORMIN 50 MG tablet TAKE ONE TABLET BY MOUTH EVERY DAY 10/11/11  Yes Gerrit Friends. Jalena Vanderlinden, MD  traMADol (ULTRAM) 50 MG tablet Take 50 mg by mouth every 6 (six) hours as needed.   Yes Historical Provider, MD  No Known Allergies    Past medical history, social history, and family history reviewed and updated.  ROS: See history of present illness.  PHYSICAL EXAM: BP 125/80  Pulse 108  Resp 18  Ht 5\' 8"  (1.727 m)  Wt 110.678 kg (244 lb)  BMI 37.10 kg/m2.  Weight increased 5 pounds since last visit   General-Well developed; no acute distress Body  habitus-proportionate weight and height Neck-No JVD; no carotid bruits Lungs-clear lung fields; resonant to percussion Cardiovascular-normal PMI; normal S1 and S2; irregular rhythm Abdomen-normal bowel sounds; soft and non-tender without masses or organomegaly Musculoskeletal-No deformities, no cyanosis or clubbing Neurologic-Normal cranial nerves; symmetric strength and tone Skin-Warm, no significant lesions Extremities-distal pulses intact; 1/2+ edema  EKG: Atrial fibrillation with a controlled ventricular response; ventricular rate of 75 bpm; otherwise normal.  ASSESSMENT AND PLAN:  Cresco Bing, MD 02/05/2012 2:09 PM

## 2012-02-05 NOTE — Assessment & Plan Note (Signed)
Patient has done well with no symptoms referable to atrial fibrillation.  Our strategy of control of heart rate and chronic anticoagulation will be continued.

## 2012-02-06 ENCOUNTER — Encounter: Payer: Self-pay | Admitting: *Deleted

## 2012-02-06 LAB — COMPREHENSIVE METABOLIC PANEL
Alkaline Phosphatase: 60 U/L (ref 39–117)
Creat: 0.92 mg/dL (ref 0.50–1.35)
Glucose, Bld: 90 mg/dL (ref 70–99)
Sodium: 139 mEq/L (ref 135–145)
Total Bilirubin: 0.9 mg/dL (ref 0.3–1.2)
Total Protein: 7.2 g/dL (ref 6.0–8.3)

## 2012-02-06 LAB — CBC
MCH: 29.1 pg (ref 26.0–34.0)
MCHC: 32.6 g/dL (ref 30.0–36.0)
RDW: 14 % (ref 11.5–15.5)

## 2012-02-06 LAB — URIC ACID: Uric Acid, Serum: 6.9 mg/dL (ref 4.0–7.8)

## 2012-02-13 ENCOUNTER — Other Ambulatory Visit: Payer: Self-pay | Admitting: Cardiology

## 2012-03-13 ENCOUNTER — Other Ambulatory Visit: Payer: Self-pay | Admitting: Cardiology

## 2012-05-12 ENCOUNTER — Other Ambulatory Visit: Payer: Self-pay | Admitting: Cardiology

## 2012-05-26 ENCOUNTER — Ambulatory Visit (INDEPENDENT_AMBULATORY_CARE_PROVIDER_SITE_OTHER): Payer: PRIVATE HEALTH INSURANCE | Admitting: *Deleted

## 2012-05-26 ENCOUNTER — Other Ambulatory Visit: Payer: Self-pay | Admitting: Cardiology

## 2012-05-26 DIAGNOSIS — I482 Chronic atrial fibrillation, unspecified: Secondary | ICD-10-CM

## 2012-05-26 DIAGNOSIS — I4891 Unspecified atrial fibrillation: Secondary | ICD-10-CM

## 2012-05-26 DIAGNOSIS — Z7901 Long term (current) use of anticoagulants: Secondary | ICD-10-CM

## 2012-05-26 LAB — PROTIME-INR: INR: 2.11 — ABNORMAL HIGH (ref <1.50)

## 2012-07-16 ENCOUNTER — Ambulatory Visit (INDEPENDENT_AMBULATORY_CARE_PROVIDER_SITE_OTHER): Payer: PRIVATE HEALTH INSURANCE | Admitting: *Deleted

## 2012-07-16 ENCOUNTER — Other Ambulatory Visit: Payer: Self-pay | Admitting: Cardiology

## 2012-07-16 DIAGNOSIS — I4891 Unspecified atrial fibrillation: Secondary | ICD-10-CM

## 2012-07-16 DIAGNOSIS — Z7901 Long term (current) use of anticoagulants: Secondary | ICD-10-CM

## 2012-07-16 DIAGNOSIS — I482 Chronic atrial fibrillation, unspecified: Secondary | ICD-10-CM

## 2012-07-16 LAB — PROTIME-INR: INR: 2.87 — ABNORMAL HIGH (ref <1.50)

## 2012-09-04 ENCOUNTER — Other Ambulatory Visit: Payer: Self-pay | Admitting: Cardiology

## 2012-09-09 ENCOUNTER — Other Ambulatory Visit: Payer: Self-pay | Admitting: Cardiology

## 2012-09-09 ENCOUNTER — Ambulatory Visit (INDEPENDENT_AMBULATORY_CARE_PROVIDER_SITE_OTHER): Payer: PRIVATE HEALTH INSURANCE | Admitting: *Deleted

## 2012-09-09 DIAGNOSIS — Z7901 Long term (current) use of anticoagulants: Secondary | ICD-10-CM

## 2012-09-09 DIAGNOSIS — I4891 Unspecified atrial fibrillation: Secondary | ICD-10-CM

## 2012-09-09 DIAGNOSIS — I482 Chronic atrial fibrillation, unspecified: Secondary | ICD-10-CM

## 2012-09-09 LAB — PROTIME-INR
INR: 2.72 — ABNORMAL HIGH (ref <1.50)
Prothrombin Time: 27.5 seconds — ABNORMAL HIGH (ref 11.6–15.2)

## 2012-10-01 ENCOUNTER — Other Ambulatory Visit: Payer: Self-pay | Admitting: Cardiology

## 2012-10-23 ENCOUNTER — Other Ambulatory Visit: Payer: Self-pay | Admitting: Cardiology

## 2012-12-01 ENCOUNTER — Other Ambulatory Visit: Payer: Self-pay | Admitting: Cardiology

## 2012-12-01 NOTE — Telephone Encounter (Signed)
rx sent to pharmacy by e-script Per pt recall noted in chart for upcoming apt for 01-2013

## 2013-01-01 ENCOUNTER — Other Ambulatory Visit: Payer: Self-pay | Admitting: Cardiology

## 2013-01-14 ENCOUNTER — Other Ambulatory Visit: Payer: Self-pay | Admitting: Cardiology

## 2013-01-16 ENCOUNTER — Ambulatory Visit (INDEPENDENT_AMBULATORY_CARE_PROVIDER_SITE_OTHER): Payer: PRIVATE HEALTH INSURANCE | Admitting: *Deleted

## 2013-01-16 ENCOUNTER — Telehealth: Payer: Self-pay | Admitting: Cardiology

## 2013-01-16 DIAGNOSIS — I482 Chronic atrial fibrillation, unspecified: Secondary | ICD-10-CM

## 2013-01-16 DIAGNOSIS — Z7901 Long term (current) use of anticoagulants: Secondary | ICD-10-CM

## 2013-01-16 DIAGNOSIS — I4891 Unspecified atrial fibrillation: Secondary | ICD-10-CM

## 2013-01-16 NOTE — Telephone Encounter (Signed)
Patient had coumadin draw at [facility], asking about results.  Please call patient.

## 2013-01-16 NOTE — Telephone Encounter (Signed)
Has been addressed by Vashti Hey and confirmed with family that they did understand instructions.

## 2013-02-05 ENCOUNTER — Encounter: Payer: Self-pay | Admitting: Cardiology

## 2013-02-05 ENCOUNTER — Ambulatory Visit (INDEPENDENT_AMBULATORY_CARE_PROVIDER_SITE_OTHER): Payer: PRIVATE HEALTH INSURANCE | Admitting: Cardiology

## 2013-02-05 VITALS — BP 120/70 | HR 99 | Ht 68.0 in | Wt 248.0 lb

## 2013-02-05 DIAGNOSIS — M109 Gout, unspecified: Secondary | ICD-10-CM

## 2013-02-05 DIAGNOSIS — Z7901 Long term (current) use of anticoagulants: Secondary | ICD-10-CM

## 2013-02-05 DIAGNOSIS — E785 Hyperlipidemia, unspecified: Secondary | ICD-10-CM

## 2013-02-05 DIAGNOSIS — I4891 Unspecified atrial fibrillation: Secondary | ICD-10-CM

## 2013-02-05 NOTE — Patient Instructions (Addendum)
Your physician recommends that you schedule a follow-up appointment in: 1 year  Stools x 3

## 2013-02-05 NOTE — Assessment & Plan Note (Addendum)
Patient is asymptomatic with respect to his arrhythmia. Stool specimens were requested last year but never returned. We have once again provided him with FOBT cards with instructions to return him here for development or to his PCP.  He agrees to do so. He also plans to undergo screening colonoscopy, for which he is overdue by 2-3 years. Recent CBC was normal. No evidence for occult GI blood loss. Benefits and drawbacks of use of a novel oral anticoagulant discussed. With stable warfarin treatment over the past decade, young age and a low bleeding risk, I do not believe that switch to a new agent would represent a significant therapeutic gain.

## 2013-02-05 NOTE — Progress Notes (Signed)
Patient ID: Dillon Strickland, male   DOB: 1951/01/28, 62 y.o.   MRN: [id number]  HPI: Scheduled return visit for this loquacious and pleasant gentleman with atrial fibrillation requiring chronic anticoagulation. Since his last visit, he reports that his wife has been evaluated and treated by a neurologist for Parkinson's disease with marked improvement in her symptomatology. This has decreased patient's stress and increase his ability to pursue independent activities including exercise.  Current Outpatient Prescriptions  Medication Sig Dispense Refill  . allopurinol (ZYLOPRIM) 100 MG tablet Take 100 mg by mouth daily.      Marland Kitchen ALPRAZolam (XANAX) 0.25 MG tablet Take 0.25 mg by mouth 2 (two) times daily as needed.        Marland Kitchen COUMADIN 5 MG tablet TAKE 1 TABLET BY MOUTH DAILY OR AS DIRECTED.  60 tablet  0  . Nutritional Supplements (MELATONIN PO) Take by mouth.      . predniSONE (DELTASONE) 10 MG tablet Take 10 mg by mouth as directed.        . simvastatin (ZOCOR) 40 MG tablet TAKE ONE TABLET DAILY AT BEDTIME FOR CHOLESTEROL.  30 tablet  11  . TENORMIN 50 MG tablet TAKE ONE TABLET BY MOUTH EVERY DAY  30 tablet  2  . traMADol (ULTRAM) 50 MG tablet Take 50 mg by mouth every 6 (six) hours as needed.       No current facility-administered medications for this visit.   No Known Allergies   Past medical history, social history, and family history reviewed and updated.  ROS: Denies chest pain, palpitations, lightheadedness, syncope, dyspnea, orthopnea or PND. No significant ankle edema. He has what sounds like mild intermittent insomnia for which he takes tramadol.  All other systems reviewed and are negative.  PHYSICAL EXAM: BP 120/70  Pulse 99  Ht 5\' 8"  (1.727 m)  Wt 112.492 kg (248 lb)  BMI 37.72 kg/m2;  Body mass index is 37.72 kg/(m^2). General-Well developed; no acute distress Body habitus-moderately overweight Neck-No JVD; no carotid bruits Lungs-clear lung fields; resonant to  percussion Cardiovascular-normal PMI; normal S1 and S2; irregular rhythm Abdomen-normal bowel sounds; soft and non-tender without masses or organomegaly Musculoskeletal-No deformities, no cyanosis or clubbing Neurologic-Normal cranial nerves; symmetric strength and tone Skin-Warm, no significant lesions Extremities-distal pulses intact; no edema  EKG: Atrial fibrillation with controlled ventricular response; ventricular rate = 88 bpm; rightward axis; prior inferior myocardial infarction; low-voltage; nonspecific anterolateral T wave abnormality.  Cartwright Bing, MD 02/05/2013  3:43 PM  ASSESSMENT AND PLAN

## 2013-02-05 NOTE — Progress Notes (Deleted)
Name: Dillon Strickland    DOB: Nov 10, 1951  Age: 62 y.o.  MR#: [id number]       PCP:  Carylon Perches, MD      Insurance: Payor: GENERIC COMMERCIAL  Plan: GENERIC COMMERCIAL  Product Type: *No Product type*    CC:   No chief complaint on file.  MEDICATION LIST STOOLS NOT RETURNED   VS Filed Vitals:   02/05/13 1430  BP: 120/70  Pulse: 99  Height: 5\' 8"  (1.727 m)  Weight: 248 lb (112.492 kg)    Weights Current Weight  02/05/13 248 lb (112.492 kg)  02/05/12 244 lb (110.678 kg)  02/01/11 239 lb (108.41 kg)    Blood Pressure  BP Readings from Last 3 Encounters:  02/05/13 120/70  02/05/12 125/80  02/01/11 120/79     Admit date:  (Not on file) Last encounter with RMR:  01/16/2013   Allergy Review of patient's allergies indicates no known allergies.  Current Outpatient Prescriptions  Medication Sig Dispense Refill  . allopurinol (ZYLOPRIM) 100 MG tablet Take 100 mg by mouth daily.      Marland Kitchen ALPRAZolam (XANAX) 0.25 MG tablet Take 0.25 mg by mouth 2 (two) times daily as needed.        Marland Kitchen COUMADIN 5 MG tablet TAKE 1 TABLET BY MOUTH DAILY OR AS DIRECTED.  60 tablet  0  . Nutritional Supplements (MELATONIN PO) Take by mouth.      . predniSONE (DELTASONE) 10 MG tablet Take 10 mg by mouth as directed.        . simvastatin (ZOCOR) 40 MG tablet TAKE ONE TABLET DAILY AT BEDTIME FOR CHOLESTEROL.  30 tablet  11  . TENORMIN 50 MG tablet TAKE ONE TABLET BY MOUTH EVERY DAY  30 tablet  2  . traMADol (ULTRAM) 50 MG tablet Take 50 mg by mouth every 6 (six) hours as needed.       No current facility-administered medications for this visit.    Discontinued Meds:   There are no discontinued medications.  Patient Active Problem List  Diagnosis  . Chronic atrial fibrillation  . Chronic anticoagulation  . Hyperlipidemia  . Laboratory test  . Gout    LABS    Component Value Date/Time   NA 139 02/05/2012 1419   NA 140 01/05/2011 1942   NA 140 01/05/2011   K 4.2 02/05/2012 1419   K 4.3 01/05/2011  1942   K 4.3 01/05/2011   CL 102 02/05/2012 1419   CL 105 01/05/2011 1942   CL 105 01/05/2011   CO2 26 02/05/2012 1419   CO2 26 01/05/2011 1942   CO2 26 01/05/2011   GLUCOSE 90 02/05/2012 1419   GLUCOSE 99 01/05/2011 1942   GLUCOSE 99 01/05/2011   BUN 18 02/05/2012 1419   BUN 13 01/05/2011 1942   BUN 13 01/05/2011   CREATININE 0.92 02/05/2012 1419   CREATININE 0.87 01/05/2011 1942   CREATININE 0.87 01/05/2011   CREATININE 0.91 02/05/2010 1145   CALCIUM 9.3 02/05/2012 1419   CALCIUM 9.3 01/05/2011 1942   CALCIUM 9.3 01/05/2011   GFRNONAA >60 02/05/2010 1145   GFRAA  Value: >60        The eGFR has been calculated using the MDRD equation. This calculation has not been validated in all clinical situations. eGFR's persistently <60 mL/min signify possible Chronic Kidney Disease. 02/05/2010 1145   CMP     Component Value Date/Time   NA 139 02/05/2012 1419   K 4.2 02/05/2012 1419   CL 102 02/05/2012  1419   CO2 26 02/05/2012 1419   GLUCOSE 90 02/05/2012 1419   BUN 18 02/05/2012 1419   CREATININE 0.92 02/05/2012 1419   CREATININE 0.87 01/05/2011 1942   CALCIUM 9.3 02/05/2012 1419   PROT 7.2 02/05/2012 1419   ALBUMIN 4.3 02/05/2012 1419   AST 25 02/05/2012 1419   ALT 20 02/05/2012 1419   ALKPHOS 60 02/05/2012 1419   BILITOT 0.9 02/05/2012 1419   GFRNONAA >60 02/05/2010 1145   GFRAA  Value: >60        The eGFR has been calculated using the MDRD equation. This calculation has not been validated in all clinical situations. eGFR's persistently <60 mL/min signify possible Chronic Kidney Disease. 02/05/2010 1145       Component Value Date/Time   WBC 10.1 02/05/2012 1419   WBC 10.9* 02/05/2010 1145   HGB 15.0 02/05/2012 1419   HGB 14.4 02/05/2010 1145   HCT 46.0 02/05/2012 1419   HCT 42.8 02/05/2010 1145   MCV 89.3 02/05/2012 1419   MCV 88.7 02/05/2010 1145    Lipid Panel     Component Value Date/Time   CHOL 163 01/05/2011 1942   TRIG 141 01/05/2011 1942   HDL 33* 01/05/2011 1942   CHOLHDL 4.9 Ratio 01/05/2011 1942    VLDL 28 01/05/2011 1942   LDLCALC 102* 01/05/2011 1942    ABG No results found for this basename: phart, pco2, pco2art, po2, po2art, hco3, tco2, acidbasedef, o2sat     Lab Results  Component Value Date   TSH 7.211* 01/05/2011   BNP (last 3 results) No results found for this basename: PROBNP,  in the last 8760 hours Cardiac Panel (last 3 results) No results found for this basename: CKTOTAL, CKMB, TROPONINI, RELINDX,  in the last 72 hours  Iron/TIBC/Ferritin No results found for this basename: iron, tibc, ferritin     EKG Orders placed in visit on 02/05/13  . EKG 12-LEAD     Prior Assessment and Plan Problem List as of 02/05/2013     ICD-9-CM     Cardiology Problems   Chronic atrial fibrillation   Last Assessment & Plan   02/05/2012 Office Visit Written 02/05/2012  5:19 PM by Kathlen Brunswick, MD     Patient has done well with no symptoms referable to atrial fibrillation.  Our strategy of control of heart rate and chronic anticoagulation will be continued.    Hyperlipidemia   Last Assessment & Plan   02/05/2012 Office Visit Written 02/05/2012  5:19 PM by Kathlen Brunswick, MD     Lipid profile probably one year ago was good, especially in the absence of known vascular disease.  A repeat study will be obtained.      Other   Chronic anticoagulation   Last Assessment & Plan   02/05/2012 Office Visit Written 02/05/2012  5:18 PM by Kathlen Brunswick, MD     Patient has done well with stable and therapeutic anticoagulation and no notable complications.  He is overdue for repeat colonoscopy and will discuss that issue with Dr. Ouida Sills.  We will continue to monitor CBC and stool Hemoccults to exclude occult GI blood loss.    Laboratory test   Gout       Imaging: No results found.

## 2013-02-07 NOTE — Assessment & Plan Note (Signed)
Lipid profile indicates adequate total and LDL cholesterol levels as of 12/2010: A repeat assessment will be obtained.

## 2013-02-12 ENCOUNTER — Encounter (INDEPENDENT_AMBULATORY_CARE_PROVIDER_SITE_OTHER): Payer: Self-pay | Admitting: *Deleted

## 2013-02-26 ENCOUNTER — Telehealth (INDEPENDENT_AMBULATORY_CARE_PROVIDER_SITE_OTHER): Payer: Self-pay | Admitting: *Deleted

## 2013-02-26 ENCOUNTER — Encounter (INDEPENDENT_AMBULATORY_CARE_PROVIDER_SITE_OTHER): Payer: Self-pay | Admitting: *Deleted

## 2013-02-26 ENCOUNTER — Telehealth (INDEPENDENT_AMBULATORY_CARE_PROVIDER_SITE_OTHER): Payer: Self-pay | Admitting: Internal Medicine

## 2013-02-26 ENCOUNTER — Other Ambulatory Visit (INDEPENDENT_AMBULATORY_CARE_PROVIDER_SITE_OTHER): Payer: Self-pay | Admitting: *Deleted

## 2013-02-26 ENCOUNTER — Telehealth: Payer: Self-pay | Admitting: Cardiology

## 2013-02-26 DIAGNOSIS — Z1211 Encounter for screening for malignant neoplasm of colon: Secondary | ICD-10-CM

## 2013-02-26 MED ORDER — PEG-KCL-NACL-NASULF-NA ASC-C 100 G PO SOLR: 1.0000 | Freq: Once | ORAL | Status: DC

## 2013-02-26 NOTE — Telephone Encounter (Signed)
Patient scheduled for colonoscopy on 04/09/13 (mod anesthisia).  They are requesting the patient hold coumadin for 5 days prior.  Please contact Ann at Dr. Karilyn Cota office x [phone].

## 2013-02-26 NOTE — Telephone Encounter (Signed)
Patient needs movi prep 

## 2013-02-26 NOTE — Telephone Encounter (Signed)
Opened in error

## 2013-02-27 MED ORDER — PEG-KCL-NACL-NASULF-NA ASC-C 100 G PO SOLR: 1.0000 | Freq: Once | ORAL | Status: DC

## 2013-02-27 NOTE — Telephone Encounter (Signed)
Please advise 

## 2013-02-28 NOTE — Telephone Encounter (Signed)
OK to hold warfarin for 5 days prior to procedures.

## 2013-03-02 NOTE — Telephone Encounter (Signed)
Patient aware to hold coumadin 5 days

## 2013-03-02 NOTE — Telephone Encounter (Signed)
Please see recommendations below.

## 2013-03-02 NOTE — Telephone Encounter (Signed)
Dr. Dietrich Pates recommendations regarding warfarin noted.

## 2013-03-04 ENCOUNTER — Encounter (INDEPENDENT_AMBULATORY_CARE_PROVIDER_SITE_OTHER): Payer: Self-pay | Admitting: *Deleted

## 2013-03-04 ENCOUNTER — Other Ambulatory Visit: Payer: Self-pay | Admitting: Cardiology

## 2013-03-10 ENCOUNTER — Ambulatory Visit (INDEPENDENT_AMBULATORY_CARE_PROVIDER_SITE_OTHER): Payer: PRIVATE HEALTH INSURANCE | Admitting: *Deleted

## 2013-03-10 DIAGNOSIS — Z1211 Encounter for screening for malignant neoplasm of colon: Secondary | ICD-10-CM

## 2013-03-10 DIAGNOSIS — Z7901 Long term (current) use of anticoagulants: Secondary | ICD-10-CM

## 2013-03-10 LAB — POC HEMOCCULT BLD/STL (HOME/3-CARD/SCREEN)
Card #3 Fecal Occult Blood, POC: NEGATIVE
Fecal Occult Blood, POC: NEGATIVE

## 2013-03-23 ENCOUNTER — Other Ambulatory Visit: Payer: Self-pay | Admitting: Cardiology

## 2013-03-24 ENCOUNTER — Ambulatory Visit (INDEPENDENT_AMBULATORY_CARE_PROVIDER_SITE_OTHER): Payer: PRIVATE HEALTH INSURANCE | Admitting: *Deleted

## 2013-03-24 DIAGNOSIS — I482 Chronic atrial fibrillation, unspecified: Secondary | ICD-10-CM

## 2013-03-24 DIAGNOSIS — Z7901 Long term (current) use of anticoagulants: Secondary | ICD-10-CM

## 2013-03-24 DIAGNOSIS — I4891 Unspecified atrial fibrillation: Secondary | ICD-10-CM

## 2013-03-24 LAB — PROTIME-INR
INR: 2.69 — ABNORMAL HIGH (ref <1.50)
Prothrombin Time: 27.5 seconds — ABNORMAL HIGH (ref 11.6–15.2)

## 2013-03-25 ENCOUNTER — Telehealth (INDEPENDENT_AMBULATORY_CARE_PROVIDER_SITE_OTHER): Payer: Self-pay | Admitting: *Deleted

## 2013-03-25 NOTE — Telephone Encounter (Signed)
  Procedure: tcs  Reason/Indication:  screening  Has patient had this procedure before?  Yes, 2005 ([place])  If so, when, by whom and where?    Is there a family history of colon cancer?  no  Who?  What age when diagnosed?    Is patient diabetic?   no      Does patient have prosthetic heart valve?  no  Do you have a pacemaker?  no  Has patient ever had endocarditis? no  Has patient had joint replacement within last 12 months?  no  Is patient on Coumadin, Plavix and/or Aspirin? yes  Medications: coumadin 5 mg twice a week & 2.5 mg 5 times a week, simvastatin 40 mg daily, allopurinol 300 mg daily, atenolol 50 mg daily  Allergies: nkda  Medication Adjustment: coumadin 5 days  Procedure date & time: 04/23/13 at 830

## 2013-03-25 NOTE — Telephone Encounter (Signed)
agree

## 2013-04-08 ENCOUNTER — Encounter (HOSPITAL_COMMUNITY): Payer: Self-pay | Admitting: Pharmacy Technician

## 2013-04-10 ENCOUNTER — Other Ambulatory Visit: Payer: Self-pay | Admitting: Cardiology

## 2013-04-23 ENCOUNTER — Ambulatory Visit (HOSPITAL_COMMUNITY)
Admission: RE | Admit: 2013-04-23 | Discharge: 2013-04-23 | Disposition: A | Discharge status: Home / Self Care | Payer: PRIVATE HEALTH INSURANCE | Source: Ambulatory Visit | Attending: Internal Medicine | Admitting: Internal Medicine

## 2013-04-23 ENCOUNTER — Encounter (HOSPITAL_COMMUNITY)
Admission: RE | Disposition: A | Discharge status: Home / Self Care | Payer: Self-pay | Source: Ambulatory Visit | Attending: Internal Medicine

## 2013-04-23 ENCOUNTER — Encounter (HOSPITAL_COMMUNITY): Payer: Self-pay | Admitting: *Deleted

## 2013-04-23 DIAGNOSIS — E663 Overweight: Secondary | ICD-10-CM | POA: Insufficient documentation

## 2013-04-23 DIAGNOSIS — Z1211 Encounter for screening for malignant neoplasm of colon: Secondary | ICD-10-CM | POA: Insufficient documentation

## 2013-04-23 DIAGNOSIS — Z860100 Personal history of colon polyps, unspecified: Secondary | ICD-10-CM | POA: Insufficient documentation

## 2013-04-23 DIAGNOSIS — K644 Residual hemorrhoidal skin tags: Secondary | ICD-10-CM | POA: Insufficient documentation

## 2013-04-23 DIAGNOSIS — D126 Benign neoplasm of colon, unspecified: Secondary | ICD-10-CM

## 2013-04-23 DIAGNOSIS — Z7901 Long term (current) use of anticoagulants: Secondary | ICD-10-CM | POA: Insufficient documentation

## 2013-04-23 DIAGNOSIS — M47812 Spondylosis without myelopathy or radiculopathy, cervical region: Secondary | ICD-10-CM | POA: Insufficient documentation

## 2013-04-23 DIAGNOSIS — E785 Hyperlipidemia, unspecified: Secondary | ICD-10-CM | POA: Insufficient documentation

## 2013-04-23 DIAGNOSIS — IMO0002 Reserved for concepts with insufficient information to code with codable children: Secondary | ICD-10-CM | POA: Insufficient documentation

## 2013-04-23 DIAGNOSIS — Z79899 Other long term (current) drug therapy: Secondary | ICD-10-CM | POA: Insufficient documentation

## 2013-04-23 DIAGNOSIS — M47817 Spondylosis without myelopathy or radiculopathy, lumbosacral region: Secondary | ICD-10-CM | POA: Insufficient documentation

## 2013-04-23 DIAGNOSIS — I4891 Unspecified atrial fibrillation: Secondary | ICD-10-CM | POA: Insufficient documentation

## 2013-04-23 HISTORY — PX: COLONOSCOPY: SHX5424

## 2013-04-23 SURGERY — COLONOSCOPY
Anesthesia: Moderate Sedation

## 2013-04-23 MED ORDER — STERILE WATER FOR IRRIGATION IR SOLN
Status: DC | PRN
  Administered 2013-04-23: 08:00:00

## 2013-04-23 MED ORDER — MEPERIDINE HCL 50 MG/ML IJ SOLN
INTRAMUSCULAR | Status: AC
  Filled 2013-04-23: qty 1

## 2013-04-23 MED ORDER — MIDAZOLAM HCL 5 MG/5ML IJ SOLN
INTRAMUSCULAR | Status: DC | PRN
  Administered 2013-04-23 (×4): 2 mg via INTRAVENOUS

## 2013-04-23 MED ORDER — MIDAZOLAM HCL 5 MG/5ML IJ SOLN
INTRAMUSCULAR | Status: AC
  Filled 2013-04-23: qty 10

## 2013-04-23 MED ORDER — MEPERIDINE HCL 50 MG/ML IJ SOLN
INTRAMUSCULAR | Status: DC | PRN
  Administered 2013-04-23 (×2): 25 mg via INTRAVENOUS

## 2013-04-23 MED ORDER — SODIUM CHLORIDE 0.9 % IV SOLN
INTRAVENOUS | Status: DC
  Administered 2013-04-23: 08:00:00 via INTRAVENOUS

## 2013-04-23 NOTE — H&P (Signed)
Dillon Strickland is an 62 y.o. male.   Chief Complaint: Patient's here for colonoscopy. HPI: Patient is 62 year old Caucasian male who is here for screening colonoscopy. Patient's last colonoscopy was in 2005. He apparently had 1 small polyp removed never followed up biopsy results. He denies abdominal pain change in bowel habits or rectal bleeding. Number history is negative for colorectal carcinoma.  Past Medical History  Diagnosis Date  . Chronic atrial fibrillation 2001    Spontaneous contrast on TEE  . Chronic anticoagulation     Colonoscopy with polypectomy in 2006; due for a repeat procedure as of 2012  . Hyperlipidemia   . Pseudogout   . Overweight   . Degenerative joint disease of spine     Mild; cervical and lumbosacral    Past Surgical History  Procedure Laterality Date  . Colonoscopy w/ polypectomy  2006    Family History  Problem Relation Age of Onset  . Stroke Mother   . Heart attack Father   . Arrhythmia Sister     Atrial fibrillation  . Colon cancer Neg Hx    Social History:  reports that he has never smoked. He does not have any smokeless tobacco history on file. He reports that  drinks alcohol. He reports that he does not use illicit drugs.  Allergies: No Known Allergies  Medications Prior to Admission  Medication Sig Dispense Refill  . allopurinol (ZYLOPRIM) 300 MG tablet Take 300 mg by mouth daily.      Marland Kitchen atenolol (TENORMIN) 50 MG tablet TAKE ONE TABLET BY MOUTH EVERY DAY  30 tablet  6  . Multiple Vitamins-Minerals (MULTIVITAMINS THER. W/MINERALS) TABS Take 1 tablet by mouth daily.      . Nutritional Supplements (MELATONIN PO) Take 1 tablet by mouth at bedtime as needed (sleep).       . simvastatin (ZOCOR) 40 MG tablet TAKE ONE TABLET DAILY AT BEDTIME FOR CHOLESTEROL.  30 tablet  11  . traMADol (ULTRAM) 50 MG tablet Take 50 mg by mouth every 6 (six) hours as needed.      . ALPRAZolam (XANAX) 0.25 MG tablet Take 0.25 mg by mouth 2 (two) times daily as  needed.        Marland Kitchen COUMADIN 5 MG tablet TAKE 1 TABLET BY MOUTH DAILY OR AS DIRECTED.  60 tablet  3  . predniSONE (DELTASONE) 10 MG tablet Take 10 mg by mouth as directed. For gout flare ups        No results found for this or any previous visit (from the past 48 hour(s)). No results found.  ROS  Pulse 93, temperature 98 F (36.7 C), temperature source Oral, resp. rate 18, height 5\' 8"  (1.727 m), weight 247 lb (112.038 kg), SpO2 98.00%. Physical Exam  Constitutional: He appears well-developed and well-nourished.  HENT:  Mouth/Throat: Oropharynx is clear and moist.  Eyes: Conjunctivae are normal. No scleral icterus.  Neck: No thyromegaly present.  Cardiovascular: Normal rate, regular rhythm and normal heart sounds.   No murmur heard. Respiratory: Effort normal and breath sounds normal.  GI: Soft. He exhibits no distension and no mass. There is no tenderness.  Musculoskeletal: He exhibits no edema.  Lymphadenopathy:    He has no cervical adenopathy.  Neurological: He is alert.  Skin: Skin is warm and dry.     Assessment/Plan Average risk screening colonoscopy.  REHMAN,NAJEEB U 04/23/2013, 8:32 AM

## 2013-04-23 NOTE — Op Note (Signed)
COLONOSCOPY PROCEDURE REPORT  PATIENT:  Dillon Strickland  MR#:  [id number] Birthdate:  30-Jun-1951, 62 y.o., male Endoscopist:  Dr. Malissa Hippo, MD Referred By:  Dr. Carylon Perches, MD Procedure Date: 04/23/2013  Procedure:   Colonoscopy.  Indications: Patient is 62 year old Caucasian male who is undergoing average risk screening colonoscopy.  Informed Consent:  The procedure and risks were reviewed with the patient and informed consent was obtained.  Medications:  Demerol 50 mg IV Versed 8 mg IV  Description of procedure:  After a digital rectal exam was performed, that colonoscope was advanced from the anus through the rectum and colon to the area of the cecum, ileocecal valve and appendiceal orifice. The cecum was deeply intubated. These structures were well-seen and photographed for the record. From the level of the cecum and ileocecal valve, the scope was slowly and cautiously withdrawn. The mucosal surfaces were carefully surveyed utilizing scope tip to flexion to facilitate fold flattening as needed. The scope was pulled down into the rectum where a thorough exam including retroflexion was performed.  Findings:   Prep excellent. Three small polyps were submitted together. 2 of these polyps ablated via cold biopsy(ileocecal valve and transverse colon) and a third polyp was cold snared from transverse colon. Mucosa of the rest of the colon and rectum was normal. Small hemorrhoids below the dentate line.    Therapeutic/Diagnostic Maneuvers Performed:  See above  Complications:  None  Cecal Withdrawal Time:  14  minutes  Impression:  Examination performed to cecum. Three small polyps removed and submitted together(1 and ileocecal valve and 2 at transverse colon).  Small external hemorrhoids.  Recommendations:  Standard instructions given. Patient will resume warfarin at usual schedule. I will contact patient with biopsy results and further recommendations.  REHMAN,NAJEEB U   04/23/2013 9:11 AM  CC: Dr. Carylon Perches, MD & Dr. Bonnetta Barry ref. provider found

## 2013-04-27 ENCOUNTER — Encounter (HOSPITAL_COMMUNITY): Payer: Self-pay | Admitting: Internal Medicine

## 2013-05-07 ENCOUNTER — Encounter (INDEPENDENT_AMBULATORY_CARE_PROVIDER_SITE_OTHER): Payer: Self-pay | Admitting: *Deleted

## 2013-06-04 ENCOUNTER — Other Ambulatory Visit: Payer: Self-pay | Admitting: Cardiology

## 2013-06-05 ENCOUNTER — Ambulatory Visit (INDEPENDENT_AMBULATORY_CARE_PROVIDER_SITE_OTHER): Payer: PRIVATE HEALTH INSURANCE | Admitting: *Deleted

## 2013-06-05 DIAGNOSIS — I4891 Unspecified atrial fibrillation: Secondary | ICD-10-CM

## 2013-06-05 DIAGNOSIS — I482 Chronic atrial fibrillation, unspecified: Secondary | ICD-10-CM

## 2013-06-05 DIAGNOSIS — Z7901 Long term (current) use of anticoagulants: Secondary | ICD-10-CM

## 2013-08-05 ENCOUNTER — Other Ambulatory Visit: Payer: Self-pay | Admitting: Cardiology

## 2013-08-05 LAB — PROTIME-INR: INR: 2.43 — ABNORMAL HIGH (ref <1.50)

## 2013-08-11 ENCOUNTER — Ambulatory Visit (INDEPENDENT_AMBULATORY_CARE_PROVIDER_SITE_OTHER): Payer: PRIVATE HEALTH INSURANCE | Admitting: *Deleted

## 2013-08-11 DIAGNOSIS — I482 Chronic atrial fibrillation, unspecified: Secondary | ICD-10-CM

## 2013-08-11 DIAGNOSIS — I4891 Unspecified atrial fibrillation: Secondary | ICD-10-CM

## 2013-08-11 DIAGNOSIS — Z7901 Long term (current) use of anticoagulants: Secondary | ICD-10-CM

## 2013-09-02 ENCOUNTER — Other Ambulatory Visit: Payer: Self-pay | Admitting: Cardiology

## 2013-09-02 LAB — PROTIME-INR
INR: 2.87 — ABNORMAL HIGH (ref <1.50)
Prothrombin Time: 28.8 seconds — ABNORMAL HIGH (ref 11.6–15.2)

## 2013-09-04 ENCOUNTER — Ambulatory Visit (INDEPENDENT_AMBULATORY_CARE_PROVIDER_SITE_OTHER): Payer: PRIVATE HEALTH INSURANCE | Admitting: *Deleted

## 2013-09-04 DIAGNOSIS — I4891 Unspecified atrial fibrillation: Secondary | ICD-10-CM

## 2013-09-04 DIAGNOSIS — I482 Chronic atrial fibrillation, unspecified: Secondary | ICD-10-CM

## 2013-09-04 DIAGNOSIS — Z7901 Long term (current) use of anticoagulants: Secondary | ICD-10-CM

## 2013-10-08 ENCOUNTER — Other Ambulatory Visit: Payer: Self-pay | Admitting: Cardiology

## 2013-12-29 ENCOUNTER — Other Ambulatory Visit: Payer: Self-pay | Admitting: Cardiology

## 2013-12-30 LAB — PROTIME-INR
INR: 2.26 — ABNORMAL HIGH (ref <1.50)
Prothrombin Time: 24.4 seconds — ABNORMAL HIGH (ref 11.6–15.2)

## 2013-12-31 ENCOUNTER — Ambulatory Visit (INDEPENDENT_AMBULATORY_CARE_PROVIDER_SITE_OTHER): Payer: PRIVATE HEALTH INSURANCE | Admitting: *Deleted

## 2013-12-31 ENCOUNTER — Telehealth: Payer: Self-pay | Admitting: *Deleted

## 2013-12-31 DIAGNOSIS — I4891 Unspecified atrial fibrillation: Secondary | ICD-10-CM

## 2013-12-31 DIAGNOSIS — Z7901 Long term (current) use of anticoagulants: Secondary | ICD-10-CM

## 2013-12-31 DIAGNOSIS — I482 Chronic atrial fibrillation, unspecified: Secondary | ICD-10-CM

## 2013-12-31 NOTE — Telephone Encounter (Signed)
Pt has INR  checked at lab two days ago. Calling for instructions

## 2014-02-16 ENCOUNTER — Telehealth: Payer: Self-pay | Admitting: Cardiology

## 2014-02-16 MED ORDER — SIMVASTATIN 40 MG PO TABS: 40.0000 mg | ORAL_TABLET | Freq: Every day | ORAL | Status: DC

## 2014-02-16 NOTE — Telephone Encounter (Signed)
Received fax refill request  Rx # [id number] Medication:  Simvastatin 40 mg tablet  Qty 30 Sig:  Take one tablet daily at bedtime for cholesterol Physician:  Branch Received fax refill request  Rx # [id number] Medication:  Atenolol 50 mg tab Qty 30 Sig:  Take one tablet by mouth every day Physician:  Harl Bowie

## 2014-02-16 NOTE — Telephone Encounter (Signed)
Rx zocor refilled,need yearly fu with [facility] or JB former [place] pt

## 2014-02-17 ENCOUNTER — Other Ambulatory Visit: Payer: Self-pay | Admitting: Cardiology

## 2014-02-17 NOTE — Telephone Encounter (Signed)
Pt is calling to ask if we could call Rx in again because the pharmacy is not getting it. Atenolol

## 2014-02-17 NOTE — Telephone Encounter (Signed)
Medication sent via escribe.  

## 2014-03-17 ENCOUNTER — Other Ambulatory Visit: Payer: Self-pay | Admitting: Cardiology

## 2014-03-18 ENCOUNTER — Ambulatory Visit (INDEPENDENT_AMBULATORY_CARE_PROVIDER_SITE_OTHER): Payer: PRIVATE HEALTH INSURANCE | Admitting: *Deleted

## 2014-03-18 DIAGNOSIS — I482 Chronic atrial fibrillation, unspecified: Secondary | ICD-10-CM

## 2014-03-18 DIAGNOSIS — I4891 Unspecified atrial fibrillation: Secondary | ICD-10-CM

## 2014-03-18 DIAGNOSIS — Z7901 Long term (current) use of anticoagulants: Secondary | ICD-10-CM

## 2014-03-18 LAB — PROTIME-INR
INR: 2.07 — AB (ref <1.50)
PROTHROMBIN TIME: 22.8 s — AB (ref 11.6–15.2)

## 2014-03-19 ENCOUNTER — Ambulatory Visit (INDEPENDENT_AMBULATORY_CARE_PROVIDER_SITE_OTHER): Payer: PRIVATE HEALTH INSURANCE | Admitting: Cardiology

## 2014-03-19 ENCOUNTER — Encounter: Payer: Self-pay | Admitting: Cardiology

## 2014-03-19 VITALS — BP 127/84 | HR 90 | Ht 68.0 in | Wt 243.0 lb

## 2014-03-19 DIAGNOSIS — E785 Hyperlipidemia, unspecified: Secondary | ICD-10-CM

## 2014-03-19 DIAGNOSIS — I4891 Unspecified atrial fibrillation: Secondary | ICD-10-CM

## 2014-03-19 MED ORDER — APIXABAN 5 MG PO TABS: 5.0000 mg | ORAL_TABLET | Freq: Two times a day (BID) | ORAL | Status: DC

## 2014-03-19 NOTE — Progress Notes (Signed)
Clinical Summary [name] is a 63 y.o.male former patient of Dr Lattie Haw, this is our first visit together. He is seen for the following medical problems.  1. Chronic afib - denies any recent palpitations - this week episode vertigo after recent URI, denies any palpitations, lightheadedness, or dizziness  - compliant with meds including coumadin, followed in coumadin clinic. No bleeding issues  2. Hyperlipidemia - compliant with statin - no recent panel in our system    Past Medical History  Diagnosis Date  . Chronic atrial fibrillation 2001    Spontaneous contrast on TEE  . Chronic anticoagulation     Colonoscopy with polypectomy in 2006; due for a repeat procedure as of 2012  . Hyperlipidemia   . Pseudogout   . Overweight   . Degenerative joint disease of spine     Mild; cervical and lumbosacral     No Known Allergies   Current Outpatient Prescriptions  Medication Sig Dispense Refill  . allopurinol (ZYLOPRIM) 300 MG tablet Take 300 mg by mouth daily.      Marland Kitchen ALPRAZolam (XANAX) 0.25 MG tablet Take 0.25 mg by mouth 2 (two) times daily as needed.        Marland Kitchen atenolol (TENORMIN) 50 MG tablet TAKE ONE TABLET BY MOUTH EVERY DAY  30 tablet  6  . COUMADIN 5 MG tablet TAKE 1 TABLET BY MOUTH DAILY OR AS DIRECTED.  60 tablet  3  . Multiple Vitamins-Minerals (MULTIVITAMINS THER. W/MINERALS) TABS Take 1 tablet by mouth daily.      . Nutritional Supplements (MELATONIN PO) Take 1 tablet by mouth at bedtime as needed (sleep).       . predniSONE (DELTASONE) 10 MG tablet Take 10 mg by mouth as directed. For gout flare ups      . simvastatin (ZOCOR) 40 MG tablet Take 1 tablet (40 mg total) by mouth daily at 6 PM.  60 tablet  0  . traMADol (ULTRAM) 50 MG tablet Take 50 mg by mouth every 6 (six) hours as needed.       No current facility-administered medications for this visit.     Past Surgical History  Procedure Laterality Date  . Colonoscopy w/ polypectomy  2006  .  Colonoscopy N/A 04/23/2013    Procedure: COLONOSCOPY;  Surgeon: Rogene Houston, MD;  Location: [facility];  Service: Endoscopy;  Laterality: N/A;  1030-rescheduled to 15 Ann notified pt     No Known Allergies    Family History  Problem Relation Age of Onset  . Stroke Mother   . Heart attack Father   . Arrhythmia Sister     Atrial fibrillation  . Colon cancer Neg Hx      Social History Mr. Heupel reports that he has never smoked. He does not have any smokeless tobacco history on file. Mr. Bunton reports that he drinks alcohol.   Review of Systems CONSTITUTIONAL: No weight loss, fever, chills, weakness or fatigue.  HEENT: Eyes: No visual loss, blurred vision, double vision or yellow sclerae.No hearing loss, sneezing, congestion, runny nose or sore throat.  SKIN: No rash or itching.  CARDIOVASCULAR: per HPI RESPIRATORY: No shortness of breath, cough or sputum.  GASTROINTESTINAL: No anorexia, nausea, vomiting or diarrhea. No abdominal pain or blood.  GENITOURINARY: No burning on urination, no polyuria NEUROLOGICAL: vertigo MUSCULOSKELETAL: No muscle, back pain, joint pain or stiffness.  LYMPHATICS: No enlarged nodes. No history of splenectomy.  PSYCHIATRIC: No history of depression or anxiety.  ENDOCRINOLOGIC: No  reports of sweating, cold or heat intolerance. No polyuria or polydipsia.  Marland Kitchen   Physical Examination p 90 bp 127/84 Wt 243 lbs BMI 37 Gen: resting comfortably, no acute distress HEENT: no scleral icterus, pupils equal round and reactive, no palptable cervical adenopathy,  CV: irreg, rate 90, no m/r/g,no JVD, no carotid bruits Resp: Clear to auscultation bilaterally GI: abdomen is soft, non-tender, non-distended, normal bowel sounds, no hepatosplenomegaly MSK: extremities are warm, no edema.  Skin: warm, no rash Neuro:  no focal deficits Psych: appropriate affect   Diagnostic Studies  07/2007 TEE + echo contrast, appendage with low velocities, normal  LV function  03/19/14 EKG Afib rate 91  Assessment and Plan   1. Afib - no current symptoms - continue rate control, will change coumadin to eliquis  2. Hyperlipidemia - request recent panel from pcp - continue current staitn   F/u 6 months  Arnoldo Lenis, M.D., F.A.C.C.

## 2014-03-19 NOTE — Patient Instructions (Signed)
Your physician wants you to follow-up in: 6 months You will receive a reminder letter in the mail two months in advance. If you don't receive a letter, please call our office to schedule the follow-up appointment.    Your physician has recommended you make the following change in your medication:    STOP Coumadin   START Eliquis 5 mg twice a day    Thank you for choosing [facility] !

## 2014-04-21 ENCOUNTER — Telehealth: Payer: Self-pay | Admitting: Adult Health

## 2014-04-21 MED ORDER — SIMVASTATIN 40 MG PO TABS: 40.0000 mg | ORAL_TABLET | Freq: Every day | ORAL | Status: DC

## 2014-04-21 NOTE — Telephone Encounter (Signed)
Received fax refill request  Rx # [id number] Medication:  Simvastatin 40 mg tablet Qty 30 Sig:  Take one tablet daily at 6 pm for cholestrol Physician:  Purcell Nails

## 2014-04-21 NOTE — Telephone Encounter (Signed)
Medication sent via escribe.  

## 2014-04-27 ENCOUNTER — Other Ambulatory Visit: Payer: Self-pay | Admitting: Internal Medicine

## 2014-04-27 ENCOUNTER — Inpatient Hospital Stay (HOSPITAL_COMMUNITY)
Admission: AD | Admit: 2014-04-27 | Discharge: 2014-05-02 | DRG: 683 | Disposition: A | Discharge status: Home / Self Care | Payer: PRIVATE HEALTH INSURANCE | Source: Ambulatory Visit | Attending: Internal Medicine | Admitting: Internal Medicine

## 2014-04-27 ENCOUNTER — Observation Stay (HOSPITAL_COMMUNITY): Payer: PRIVATE HEALTH INSURANCE

## 2014-04-27 ENCOUNTER — Encounter (HOSPITAL_COMMUNITY): Payer: Self-pay | Admitting: *Deleted

## 2014-04-27 DIAGNOSIS — Z823 Family history of stroke: Secondary | ICD-10-CM

## 2014-04-27 DIAGNOSIS — Z8249 Family history of ischemic heart disease and other diseases of the circulatory system: Secondary | ICD-10-CM

## 2014-04-27 DIAGNOSIS — I4891 Unspecified atrial fibrillation: Secondary | ICD-10-CM | POA: Diagnosis present

## 2014-04-27 DIAGNOSIS — R809 Proteinuria, unspecified: Secondary | ICD-10-CM | POA: Diagnosis present

## 2014-04-27 DIAGNOSIS — E860 Dehydration: Secondary | ICD-10-CM | POA: Diagnosis present

## 2014-04-27 DIAGNOSIS — I10 Essential (primary) hypertension: Secondary | ICD-10-CM | POA: Diagnosis present

## 2014-04-27 DIAGNOSIS — Z833 Family history of diabetes mellitus: Secondary | ICD-10-CM

## 2014-04-27 DIAGNOSIS — E785 Hyperlipidemia, unspecified: Secondary | ICD-10-CM | POA: Diagnosis present

## 2014-04-27 DIAGNOSIS — Z7901 Long term (current) use of anticoagulants: Secondary | ICD-10-CM

## 2014-04-27 DIAGNOSIS — N179 Acute kidney failure, unspecified: Principal | ICD-10-CM | POA: Diagnosis present

## 2014-04-27 DIAGNOSIS — N390 Urinary tract infection, site not specified: Secondary | ICD-10-CM | POA: Diagnosis present

## 2014-04-27 DIAGNOSIS — M109 Gout, unspecified: Secondary | ICD-10-CM | POA: Diagnosis present

## 2014-04-27 DIAGNOSIS — D649 Anemia, unspecified: Secondary | ICD-10-CM | POA: Diagnosis present

## 2014-04-27 LAB — URINALYSIS, ROUTINE W REFLEX MICROSCOPIC
Bilirubin Urine: NEGATIVE
Glucose, UA: NEGATIVE mg/dL
Ketones, ur: NEGATIVE mg/dL
LEUKOCYTES UA: NEGATIVE
Nitrite: NEGATIVE
PH: 5.5 (ref 5.0–8.0)
Protein, ur: 100 mg/dL — AB
Specific Gravity, Urine: 1.025 (ref 1.005–1.030)
Urobilinogen, UA: 0.2 mg/dL (ref 0.0–1.0)

## 2014-04-27 LAB — URINE MICROSCOPIC-ADD ON

## 2014-04-27 LAB — COMPREHENSIVE METABOLIC PANEL
ALK PHOS: 108 U/L (ref 39–117)
ALT: 23 U/L (ref 0–53)
AST: 29 U/L (ref 0–37)
Albumin: 3.1 g/dL — ABNORMAL LOW (ref 3.5–5.2)
BILIRUBIN TOTAL: 0.4 mg/dL (ref 0.3–1.2)
BUN: 48 mg/dL — AB (ref 6–23)
CHLORIDE: 96 meq/L (ref 96–112)
CO2: 26 mEq/L (ref 19–32)
CREATININE: 3.18 mg/dL — AB (ref 0.50–1.35)
Calcium: 9.1 mg/dL (ref 8.4–10.5)
GFR calc Af Amer: 23 mL/min — ABNORMAL LOW (ref >90)
GFR calc non Af Amer: 19 mL/min — ABNORMAL LOW (ref >90)
Glucose, Bld: 113 mg/dL — ABNORMAL HIGH (ref 70–99)
POTASSIUM: 4.1 meq/L (ref 3.7–5.3)
Sodium: 136 mEq/L — ABNORMAL LOW (ref 137–147)
TOTAL PROTEIN: 7.5 g/dL (ref 6.0–8.3)

## 2014-04-27 LAB — CBC WITH DIFFERENTIAL/PLATELET
BASOS ABS: 0 10*3/uL (ref 0.0–0.1)
Basophils Relative: 0 % (ref 0–1)
EOS ABS: 0.1 10*3/uL (ref 0.0–0.7)
Eosinophils Relative: 1 % (ref 0–5)
HCT: 34.2 % — ABNORMAL LOW (ref 39.0–52.0)
Hemoglobin: 11.6 g/dL — ABNORMAL LOW (ref 13.0–17.0)
Lymphocytes Relative: 19 % (ref 12–46)
Lymphs Abs: 1.8 10*3/uL (ref 0.7–4.0)
MCH: 29.1 pg (ref 26.0–34.0)
MCHC: 33.9 g/dL (ref 30.0–36.0)
MCV: 85.7 fL (ref 78.0–100.0)
MONOS PCT: 8 % (ref 3–12)
Monocytes Absolute: 0.7 10*3/uL (ref 0.1–1.0)
NEUTROS ABS: 6.7 10*3/uL (ref 1.7–7.7)
NEUTROS PCT: 72 % (ref 43–77)
Platelets: 333 10*3/uL (ref 150–400)
RBC: 3.99 MIL/uL — ABNORMAL LOW (ref 4.22–5.81)
RDW: 13.6 % (ref 11.5–15.5)
WBC: 9.3 10*3/uL (ref 4.0–10.5)

## 2014-04-27 MED ORDER — ZOLPIDEM TARTRATE 5 MG PO TABS
10.0000 mg | ORAL_TABLET | Freq: Every evening | ORAL | Status: DC | PRN
  Administered 2014-04-27 – 2014-05-01 (×5): 10 mg via ORAL
  Filled 2014-04-27 (×5): qty 2

## 2014-04-27 MED ORDER — ALUM & MAG HYDROXIDE-SIMETH 200-200-20 MG/5ML PO SUSP: 30.0000 mL | Freq: Four times a day (QID) | ORAL | Status: DC | PRN

## 2014-04-27 MED ORDER — APIXABAN 5 MG PO TABS: 5.0000 mg | ORAL_TABLET | Freq: Two times a day (BID) | ORAL | Status: DC

## 2014-04-27 MED ORDER — ONDANSETRON HCL 4 MG PO TABS
4.0000 mg | ORAL_TABLET | Freq: Four times a day (QID) | ORAL | Status: DC | PRN
Start: 2014-04-27 — End: 2014-05-02
  Administered 2014-04-27 – 2014-04-29 (×3): 4 mg via ORAL
  Filled 2014-04-27 (×3): qty 1

## 2014-04-27 MED ORDER — APIXABAN 5 MG PO TABS
5.0000 mg | ORAL_TABLET | Freq: Two times a day (BID) | ORAL | Status: DC
  Administered 2014-04-27 – 2014-05-02 (×10): 5 mg via ORAL
  Filled 2014-04-27 (×10): qty 1

## 2014-04-27 MED ORDER — ACETAMINOPHEN 325 MG PO TABS
650.0000 mg | ORAL_TABLET | Freq: Four times a day (QID) | ORAL | Status: DC | PRN
  Administered 2014-04-28: 650 mg via ORAL
  Filled 2014-04-27: qty 2

## 2014-04-27 MED ORDER — SODIUM CHLORIDE 0.9 % IV SOLN
INTRAVENOUS | Status: DC
  Administered 2014-04-27 – 2014-05-01 (×11): via INTRAVENOUS

## 2014-04-27 MED ORDER — ONDANSETRON HCL 4 MG/2ML IJ SOLN
4.0000 mg | Freq: Four times a day (QID) | INTRAMUSCULAR | Status: DC | PRN
Start: 2014-04-27 — End: 2014-05-02

## 2014-04-27 MED ORDER — ACETAMINOPHEN 650 MG RE SUPP: 650.0000 mg | Freq: Four times a day (QID) | RECTAL | Status: DC | PRN

## 2014-04-27 MED ORDER — ATENOLOL 25 MG PO TABS
50.0000 mg | ORAL_TABLET | Freq: Every day | ORAL | Status: DC
  Administered 2014-04-27 – 2014-05-02 (×6): 50 mg via ORAL
  Filled 2014-04-27 (×6): qty 2

## 2014-04-27 MED ORDER — ALPRAZOLAM 0.25 MG PO TABS
0.2500 mg | ORAL_TABLET | Freq: Two times a day (BID) | ORAL | Status: DC | PRN
  Administered 2014-04-28: 0.25 mg via ORAL
  Filled 2014-04-27: qty 1

## 2014-04-27 MED ORDER — ALLOPURINOL 300 MG PO TABS
300.0000 mg | ORAL_TABLET | Freq: Every day | ORAL | Status: DC
  Administered 2014-04-27 – 2014-05-02 (×6): 300 mg via ORAL
  Filled 2014-04-27 (×6): qty 1

## 2014-04-28 DIAGNOSIS — Z7901 Long term (current) use of anticoagulants: Secondary | ICD-10-CM | POA: Diagnosis not present

## 2014-04-28 DIAGNOSIS — R809 Proteinuria, unspecified: Secondary | ICD-10-CM | POA: Diagnosis present

## 2014-04-28 DIAGNOSIS — D649 Anemia, unspecified: Secondary | ICD-10-CM | POA: Diagnosis present

## 2014-04-28 DIAGNOSIS — M109 Gout, unspecified: Secondary | ICD-10-CM | POA: Diagnosis present

## 2014-04-28 DIAGNOSIS — N390 Urinary tract infection, site not specified: Secondary | ICD-10-CM | POA: Diagnosis present

## 2014-04-28 DIAGNOSIS — E860 Dehydration: Secondary | ICD-10-CM | POA: Diagnosis present

## 2014-04-28 DIAGNOSIS — Z823 Family history of stroke: Secondary | ICD-10-CM | POA: Diagnosis not present

## 2014-04-28 DIAGNOSIS — N179 Acute kidney failure, unspecified: Secondary | ICD-10-CM | POA: Diagnosis present

## 2014-04-28 DIAGNOSIS — Z8249 Family history of ischemic heart disease and other diseases of the circulatory system: Secondary | ICD-10-CM | POA: Diagnosis not present

## 2014-04-28 DIAGNOSIS — I4891 Unspecified atrial fibrillation: Secondary | ICD-10-CM | POA: Diagnosis present

## 2014-04-28 DIAGNOSIS — Z833 Family history of diabetes mellitus: Secondary | ICD-10-CM | POA: Diagnosis not present

## 2014-04-28 DIAGNOSIS — E785 Hyperlipidemia, unspecified: Secondary | ICD-10-CM | POA: Diagnosis present

## 2014-04-28 DIAGNOSIS — I10 Essential (primary) hypertension: Secondary | ICD-10-CM | POA: Diagnosis present

## 2014-04-28 LAB — BASIC METABOLIC PANEL
BUN: 42 mg/dL — ABNORMAL HIGH (ref 6–23)
CHLORIDE: 104 meq/L (ref 96–112)
CO2: 23 meq/L (ref 19–32)
Calcium: 8.5 mg/dL (ref 8.4–10.5)
Creatinine, Ser: 2.98 mg/dL — ABNORMAL HIGH (ref 0.50–1.35)
GFR calc Af Amer: 24 mL/min — ABNORMAL LOW (ref >90)
GFR calc non Af Amer: 21 mL/min — ABNORMAL LOW (ref >90)
GLUCOSE: 114 mg/dL — AB (ref 70–99)
Potassium: 4.6 mEq/L (ref 3.7–5.3)
SODIUM: 141 meq/L (ref 137–147)

## 2014-04-28 MED ORDER — CIPROFLOXACIN HCL 250 MG PO TABS
250.0000 mg | ORAL_TABLET | Freq: Two times a day (BID) | ORAL | Status: DC
  Administered 2014-04-28 – 2014-05-02 (×9): 250 mg via ORAL
  Filled 2014-04-28 (×9): qty 1

## 2014-04-28 MED ORDER — PREDNISONE 20 MG PO TABS
40.0000 mg | ORAL_TABLET | Freq: Every morning | ORAL | Status: DC
  Administered 2014-04-28 – 2014-05-02 (×5): 40 mg via ORAL
  Filled 2014-04-28 (×2): qty 2
  Filled 2014-04-28: qty 4
  Filled 2014-04-28 (×2): qty 2

## 2014-04-28 NOTE — Progress Notes (Signed)
Dillon Strickland, [place]             ACCOUNT NO.:  [id number]  MEDICAL RECORD NO.:  [id number]  LOCATION:                                 FACILITY:  PHYSICIAN:  Paula Compton. Willey Blade, MD       DATE OF BIRTH:  1951/02/04  DATE OF PROCEDURE:  04/28/2014 DATE OF DISCHARGE:                                PROGRESS NOTE   SUBJECTIVE:  [name] was admitted yesterday for evaluation of recurrent nausea and vomiting associated with acute renal failure.  BUN and creatinine were discovered to be 50 and 3.5 on labs performed as an outpatient.  He was hospitalized and started on IV fluids.  He was treated with Zofran.  He feels better now.  He denies any nausea.  He denies any abdominal pain.  OBJECTIVE:  VITAL SIGNS:  Temperature 98.8, pulse 100, blood pressure of 125/83. LUNGS:  Clear. HEART:  Regular with no murmurs. ABDOMEN:  Soft and nontender with no hepatosplenomegaly. EXTREMITIES:  No edema.  ASSESSMENT/PLAN: 1. Acute renal failure/dehydration.  BUN and creatinine were 48 and     3.18 on admission yesterday and are 42 and 2.98 today.  Urine     output was 1100 mL yesterday.  Urinalysis reveals 7-10 white cells,     21-50 red cells, many bacteria, and 100 mg/dL of protein.  Nitrite     and leukocyte esterase are negative.  A urine culture will be     obtained and he will be started on ciprofloxacin for an underlying     urinary tract infection.  He does not experience dysuria now but     believes he has had some mild symptoms in the past several days.     His white count yesterday was 9.3 but was 11 as an outpatient.  His     renal ultrasound reveals absence of the left kidney.  The right     kidney appears normal.  Family y history reveals that 1 sister has     only 1 kidney. 2. Chronic atrial fibrillation.  Continue Eliquis and atenolol.     Paula Compton. Willey Blade, MD     ROF/MEDQ  D:  04/28/2014  T:  04/28/2014  Job:  [id number]

## 2014-04-28 NOTE — Plan of Care (Signed)
Problem: Phase I Progression Outcomes Goal: Pain controlled with appropriate interventions Outcome: Progressing New onset gout

## 2014-04-28 NOTE — H&P (Signed)
NAMEANTHONYJAMES, Strickland             ACCOUNT NO.:  [id number]  MEDICAL RECORD NO.:  [id number]  LOCATION:                                 FACILITY:  PHYSICIAN:  Paula Compton. Willey Blade, MD       DATE OF BIRTH:  1951/06/01  DATE OF ADMISSION:  04/27/2014 DATE OF DISCHARGE:  LH                             HISTORY & PHYSICAL   CHIEF COMPLAINT:  Nausea.  HISTORY OF PRESENT ILLNESS:  This patient is a 63 year old, white male, who was admitted for evaluation and treatment of acute renal failure. He had been seen in the office 1 day prior to admission complaining of approximately 10 days of nausea, vomiting, and loss of appetite. Laboratory studies revealed a BUN and creatinine of 50 and 3.3.  He was subsequently hospitalized for IV fluids for treatment of dehydration and for further evaluation.  He denied abdominal pain, flank pain, or difficulty voiding.  PAST MEDICAL HISTORY: 1. Chronic atrial fibrillation. 2. Gout. 3. Hyperlipidemia. 4. Anxiety.  MEDICATIONS: 1. Eliquis 5 mg b.i.d., allopurinol 300 mg daily, Xanax 0.25 mg b.i.d.     p.r.n., atenolol 50 mg daily, multivitamin daily, simvastatin 40 mg     daily, tramadol 50 mg q.6 h p.r.n., zolpidem 10 mg at bedtime     p.r.n.  ALLERGIES:  None.  SOCIAL HISTORY:  He does not smoke, drink, or use recreational substances.  FAMILY HISTORY:  Remarkable for the fact that one sister has one kidney.  REVIEW OF SYSTEMS:  Noncontributory.  PHYSICAL EXAMINATION:  VITAL SIGNS:  Temperature 97.2, pulse 98, respirations 20, blood pressure 126/90. GENERAL:  Alert, in no distress. HEENT:  Eyes, nose, and pharynx are unremarkable.  Mucous membranes appear moist. NECK:  Supple with no JVD or thyromegaly. LUNGS:  Clear. HEART:  Irregularly irregular. ABDOMEN:  Soft and nontender with no hepatosplenomegaly.  No CVA tenderness. EXTREMITIES:  Normal pulses.  No clubbing or edema. NEURO:  Grossly intact. LYMPH NODES:  No cervical or supraclavicular  adenopathy.  IMPRESSION/PLAN: 1. Acute renal failure.  Previous creatinine was in the 1 range, his     creatinine is now 3.3 on his outpatient labs.  He is felt to be     significantly dehydrated from a possible recent episode of     gastroenteritis.  He is being started on IV fluids.  We will have     further assessment with renal ultrasound and urinalysis. 2. Chronic atrial fibrillation.  Continue Eliquis. 3. Gout.  Continue allopurinol. 4. Hyperlipidemia.     Paula Compton. Willey Blade, MD     ROF/MEDQ  D:  04/28/2014  T:  04/28/2014  Job:  [id number]

## 2014-04-28 NOTE — Care Management Note (Signed)
    Page 1 of 1   04/28/2014     11:38:04 AM CARE MANAGEMENT NOTE 04/28/2014  Patient:  Dillon Strickland, Dillon Strickland   Account Number:  [id number]  Date Initiated:  04/28/2014  Documentation initiated by:  Theophilus Kinds  Subjective/Objective Assessment:   Pt admitted from home with acute renal failure. Pt lives with his wife and will return home at discharge. Pt is independent with ADL"s.     Action/Plan:   No CM needs noted.   Anticipated DC Date:  04/30/2014   Anticipated DC Plan:  [place]  CM consult      Choice offered to / List presented to:             Status of service:  Completed, signed off Medicare Important Message given?   (If response is "NO", the following Medicare IM given date fields will be blank) Date Medicare IM given:   Date Additional Medicare IM given:    Discharge Disposition:  HOME/SELF CARE  Per UR Regulation:    If discussed at Long Length of Stay Meetings, dates discussed:    Comments:  04/28/14 [place], RN BSN CM

## 2014-04-28 NOTE — Progress Notes (Signed)
INITIAL NUTRITION ASSESSMENT  DOCUMENTATION CODES Per approved criteria  -Non-severe (moderate) malnutrition in the context of acute illness or injury  Pt meets criteria for moderate MALNUTRITION in the context of acute illness as evidenced by energy intake <75% for >7 days and 5% wt loss 1 month.   INTERVENTION: ProStat 30 ml BID (each 30 ml provides 100 kcal, 15 gr protein)   NUTRITION DIAGNOSIS: Inadequate oral intake related to altered GI function as evidenced by vomiting, reported poor food and beverage intake (10 days) and dehydration at admission .   Goal: Pt to meet >/= 90% of their estimated nutrition needs     Monitor: Po intake, labs and wt trends    Reason for Assessment: Malnutrition Screen Score =  3  63 y.o. male  ASSESSMENT: Pt reports nausea and vomiting 10 days prior to admission. Very little po intake and found with acute renal failure and dehydrated at admission. Home diet is regular. Feeds himself. Acute weight loss 12#, 5% with current illness. He is currently tolerating regular diet and good fluid intake.  Nutrition Focused Physical Exam:  Subcutaneous Fat:  Orbital Region: mild depletion Upper Arm Region: WDL Thoracic and Lumbar Region: not assessed  Muscle:  Temple Region: mild depletion Clavicle Bone Region: WDL Clavicle and Acromion Bone Region: WDl Scapular Bone Region: not assessed Dorsal Hand: WDL Patellar Region: WDL Anterior Thigh Region: WDL Posterior Calf Region: not assessed  Edema: none noted    Height: Ht Readings from Last 1 Encounters:  04/27/14 5\' 8"  (1.727 m)    Weight: Wt Readings from Last 1 Encounters:  04/28/14 233 lb 1.6 oz (105.733 kg)    Ideal Body Weight: 154# (70 kg)  % Ideal Body Weight: 151%  Wt Readings from Last 10 Encounters:  04/28/14 233 lb 1.6 oz (105.733 kg)  03/19/14 243 lb (110.224 kg)  04/23/13 247 lb (112.038 kg)  04/23/13 247 lb (112.038 kg)  02/05/13 248 lb (112.492 kg)  02/05/12 244 lb  (110.678 kg)  02/01/11 239 lb (108.41 kg)  08/31/10 250 lb (113.399 kg)  02/17/10 244 lb (110.678 kg)    Usual Body Weight: 245#  % Usual Body Weight: 95%  BMI:  Body mass index is 35.45 kg/(m^2).obesity class 2  Estimated Nutritional Needs: Kcal: 1800-2100  Protein: 105 gr  Fluid: >1800 ml daily (normal needs)  Skin: intact  Diet Order: General  EDUCATION NEEDS: -No education needs identified at this time   Intake/Output Summary (Last 24 hours) at 04/28/14 1636 Last data filed at 04/28/14 1342  Gross per 24 hour  Intake   3190 ml  Output   1350 ml  Net   1840 ml    Last BM: 04/27/14   Labs:   Recent Labs Lab 04/27/14 1303 04/28/14 0608  NA 136* 141  K 4.1 4.6  CL 96 104  CO2 26 23  BUN 48* 42*  CREATININE 3.18* 2.98*  CALCIUM 9.1 8.5  GLUCOSE 113* 114*    CBG (last 3)  No results found for this basename: GLUCAP,  in the last 72 hours  Scheduled Meds: . allopurinol  300 mg Oral Daily  . apixaban  5 mg Oral BID  . atenolol  50 mg Oral Daily  . ciprofloxacin  250 mg Oral BID    Continuous Infusions: . sodium chloride 150 mL/hr at 04/28/14 0414    Past Medical History  Diagnosis Date  . Chronic atrial fibrillation 2001    Spontaneous contrast on TEE  . Chronic  anticoagulation     Colonoscopy with polypectomy in 2006; due for a repeat procedure as of 2012  . Hyperlipidemia   . Pseudogout   . Overweight   . Degenerative joint disease of spine     Mild; cervical and lumbosacral    Past Surgical History  Procedure Laterality Date  . Colonoscopy w/ polypectomy  2006  . Colonoscopy N/A 04/23/2013    Procedure: COLONOSCOPY;  Surgeon: Rogene Houston, MD;  Location: [facility];  Service: Endoscopy;  Laterality: N/A;  1030-rescheduled to [place] notified pt    Colman Cater MS,RD,CSG,LDN Office: [phone] Pager: [phone]

## 2014-04-28 NOTE — Progress Notes (Signed)
UR completed. Patient changed to inpatient- requiring IVF @ 150cc/hr  

## 2014-04-29 LAB — URINE MICROSCOPIC-ADD ON

## 2014-04-29 LAB — URINALYSIS, ROUTINE W REFLEX MICROSCOPIC
Bilirubin Urine: NEGATIVE
GLUCOSE, UA: NEGATIVE mg/dL
Ketones, ur: NEGATIVE mg/dL
LEUKOCYTES UA: NEGATIVE
Nitrite: NEGATIVE
PROTEIN: NEGATIVE mg/dL
Specific Gravity, Urine: 1.01 (ref 1.005–1.030)
Urobilinogen, UA: 0.2 mg/dL (ref 0.0–1.0)
pH: 5 (ref 5.0–8.0)

## 2014-04-29 LAB — URINE CULTURE: Colony Count: 6000

## 2014-04-29 LAB — BASIC METABOLIC PANEL
BUN: 38 mg/dL — ABNORMAL HIGH (ref 6–23)
CO2: 21 meq/L (ref 19–32)
Calcium: 7.9 mg/dL — ABNORMAL LOW (ref 8.4–10.5)
Chloride: 105 mEq/L (ref 96–112)
Creatinine, Ser: 2.88 mg/dL — ABNORMAL HIGH (ref 0.50–1.35)
GFR calc Af Amer: 25 mL/min — ABNORMAL LOW (ref >90)
GFR calc non Af Amer: 22 mL/min — ABNORMAL LOW (ref >90)
Glucose, Bld: 154 mg/dL — ABNORMAL HIGH (ref 70–99)
POTASSIUM: 4.5 meq/L (ref 3.7–5.3)
SODIUM: 140 meq/L (ref 137–147)

## 2014-04-29 MED ORDER — FUROSEMIDE 10 MG/ML IJ SOLN
40.0000 mg | Freq: Two times a day (BID) | INTRAMUSCULAR | Status: DC
  Administered 2014-04-29 (×2): 40 mg via INTRAVENOUS
  Filled 2014-04-29 (×2): qty 4

## 2014-04-29 NOTE — Clinical Documentation Improvement (Signed)
Possible Clinical Conditions?  Severe Malnutrition   Protein Calorie Malnutrition Severe Protein Calorie Malnutrition Other Condition Cannot clinically determine  Supporting Information:NUTRITIONAL ASSESSMENT by Frederik Schmidt, RD at 04/28/2014  4:36 PM  DOCUMENTATION CODES  Per approved criteria   -Non-severe (moderate) malnutrition in the context of acute illness or injury   Pt meets criteria for moderate MALNUTRITION in the context of acute illness as evidenced by energy intake <75% for >7 days and 5% wt loss 1 month.   NUTRITION DIAGNOSIS: Inadequate oral intake related to altered GI function as evidenced by vomiting, reported poor food and beverage intake (10 days) and dehydration at admission .  Thank You, Alessandra Grout, RN, BSN, CCDS, Clinical Documentation Specialist:  [facility] [employer] [facility]

## 2014-04-29 NOTE — Progress Notes (Signed)
NAMEQUINLAN, VOLLMER             ACCOUNT NO.:  [id number]  MEDICAL RECORD NO.:  [id number]  LOCATION:  A302                          FACILITY:  [facility]  PHYSICIAN:  Paula Compton. Willey Blade, MD       DATE OF BIRTH:  08-Aug-1951  DATE OF PROCEDURE:  04/29/2014 DATE OF DISCHARGE:                                PROGRESS NOTE   SUBJECTIVE:  He developed gout in his left ankle yesterday and was treated with prednisone.  Symptoms are much improved today.  He denies any vomiting, abdominal pain, or dysuria.  OBJECTIVE:  VITAL SIGNS:  Normal.  Temperature 97.6, pulse 100, blood pressure 107/86. LUNGS:  Clear. HEART:  Regular with no murmurs. ABDOMEN:  Soft and nontender with no organomegaly. EXTREMITIES:  Mild swelling is present in the left ankle.  IMPRESSION/PLAN: 1. Acute renal failure.  BUN and creatinine are only slightly improved     to 38 and 2.88.  Consult Nephrology. 2. Possible urinary tract infection.  Culture pending.  Continue     Cipro. 3. Solitary kidney. 4. Gout.  Continue prednisone. 5. Urine output yesterday was 1650 with positive I and O of 4375.     Paula Compton. Willey Blade, MD     ROF/MEDQ  D:  04/29/2014  T:  04/29/2014  Job:  [id number]

## 2014-04-29 NOTE — Consult Note (Signed)
Reason for Consult: Acute kidney injury Referring Physician: Dr. Otila Kluver is an 63 y.o. male.  HPI: He patient was history of gout, history of definition presently can which is of nausea and vomiting of 2 weeks duration. When patient was evaluated and was found to have elevated BUN and creatinine in sentence squad. According to the patient about 2 weeks ago he ate Strawberry and started to any nausea and vomiting. Patient was not able to keep down. He has lost about 16 pounds. Patient denies any diarrhea he hasn't had any fever chills or sweating. Presently he seems to do better and offers no complaints. Patient denies any previous history of kidney stone or renal failure.  Past Medical History  Diagnosis Date  . Chronic atrial fibrillation 2001    Spontaneous contrast on TEE  . Chronic anticoagulation     Colonoscopy with polypectomy in 2006; due for a repeat procedure as of 2012  . Hyperlipidemia   . Pseudogout   . Overweight   . Degenerative joint disease of spine     Mild; cervical and lumbosacral    Past Surgical History  Procedure Laterality Date  . Colonoscopy w/ polypectomy  2006  . Colonoscopy N/A 04/23/2013    Procedure: COLONOSCOPY;  Surgeon: Rogene Houston, MD;  Location: [facility];  Service: Endoscopy;  Laterality: N/A;  1030-rescheduled to 81 Ann notified pt    Family History  Problem Relation Age of Onset  . Stroke Mother   . Cancer Mother   . Heart attack Father   . Arrhythmia Sister     Atrial fibrillation  . Colon cancer Neg Hx   . Arrhythmia Brother   . Arrhythmia Brother   . Diabetes Other   . Heart attack Other     Social History:  reports that he has never smoked. He does not have any smokeless tobacco history on file. He reports that he drinks alcohol. He reports that he does not use illicit drugs.  Allergies: No Known Allergies  Medications: I have reviewed the patient's current medications.  Results for orders placed during  the hospital encounter of 04/27/14 (from the past 48 hour(s))  CBC WITH DIFFERENTIAL     Status: Abnormal   Collection Time    04/27/14  1:03 PM      Result Value Ref Range   WBC 9.3  4.0 - 10.5 K/uL   RBC 3.99 (*) 4.22 - 5.81 MIL/uL   Hemoglobin 11.6 (*) 13.0 - 17.0 g/dL   HCT 34.2 (*) 39.0 - 52.0 %   MCV 85.7  78.0 - 100.0 fL   MCH 29.1  26.0 - 34.0 pg   MCHC 33.9  30.0 - 36.0 g/dL   RDW 13.6  11.5 - 15.5 %   Platelets 333  150 - 400 K/uL   Neutrophils Relative % 72  43 - 77 %   Neutro Abs 6.7  1.7 - 7.7 K/uL   Lymphocytes Relative 19  12 - 46 %   Lymphs Abs 1.8  0.7 - 4.0 K/uL   Monocytes Relative 8  3 - 12 %   Monocytes Absolute 0.7  0.1 - 1.0 K/uL   Eosinophils Relative 1  0 - 5 %   Eosinophils Absolute 0.1  0.0 - 0.7 K/uL   Basophils Relative 0  0 - 1 %   Basophils Absolute 0.0  0.0 - 0.1 K/uL  COMPREHENSIVE METABOLIC PANEL     Status: Abnormal   Collection Time  04/27/14  1:03 PM      Result Value Ref Range   Sodium 136 (*) 137 - 147 mEq/L   Potassium 4.1  3.7 - 5.3 mEq/L   Chloride 96  96 - 112 mEq/L   CO2 26  19 - 32 mEq/L   Glucose, Bld 113 (*) 70 - 99 mg/dL   BUN 48 (*) 6 - 23 mg/dL   Creatinine, Ser 3.18 (*) 0.50 - 1.35 mg/dL   Calcium 9.1  8.4 - 10.5 mg/dL   Total Protein 7.5  6.0 - 8.3 g/dL   Albumin 3.1 (*) 3.5 - 5.2 g/dL   AST 29  0 - 37 U/L   ALT 23  0 - 53 U/L   Alkaline Phosphatase 108  39 - 117 U/L   Total Bilirubin 0.4  0.3 - 1.2 mg/dL   GFR calc non Af Amer 19 (*) >90 mL/min   GFR calc Af Amer 23 (*) >90 mL/min   Comment: (NOTE)     The eGFR has been calculated using the CKD EPI equation.     This calculation has not been validated in all clinical situations.     eGFR's persistently <90 mL/min signify possible Chronic Kidney     Disease.  URINALYSIS, ROUTINE W REFLEX MICROSCOPIC     Status: Abnormal   Collection Time    04/27/14  4:00 PM      Result Value Ref Range   Color, Urine YELLOW  YELLOW   APPearance CLEAR  CLEAR   Specific  Gravity, Urine 1.025  1.005 - 1.030   pH 5.5  5.0 - 8.0   Glucose, UA NEGATIVE  NEGATIVE mg/dL   Hgb urine dipstick LARGE (*) NEGATIVE   Bilirubin Urine NEGATIVE  NEGATIVE   Ketones, ur NEGATIVE  NEGATIVE mg/dL   Protein, ur 100 (*) NEGATIVE mg/dL   Urobilinogen, UA 0.2  0.0 - 1.0 mg/dL   Nitrite NEGATIVE  NEGATIVE   Leukocytes, UA NEGATIVE  NEGATIVE  URINE MICROSCOPIC-ADD ON     Status: Abnormal   Collection Time    04/27/14  4:00 PM      Result Value Ref Range   Squamous Epithelial / LPF RARE  RARE   WBC, UA 7-10  <3 WBC/hpf   RBC / HPF 21-50  <3 RBC/hpf   Bacteria, UA MANY (*) RARE   Casts GRANULAR CAST (*) NEGATIVE   Urine-Other AMORPHOUS URATES/PHOSPHATES    BASIC METABOLIC PANEL     Status: Abnormal   Collection Time    04/28/14  6:08 AM      Result Value Ref Range   Sodium 141  137 - 147 mEq/L   Potassium 4.6  3.7 - 5.3 mEq/L   Chloride 104  96 - 112 mEq/L   Comment: DELTA CHECK NOTED   CO2 23  19 - 32 mEq/L   Glucose, Bld 114 (*) 70 - 99 mg/dL   BUN 42 (*) 6 - 23 mg/dL   Creatinine, Ser 2.98 (*) 0.50 - 1.35 mg/dL   Calcium 8.5  8.4 - 10.5 mg/dL   GFR calc non Af Amer 21 (*) >90 mL/min   GFR calc Af Amer 24 (*) >90 mL/min   Comment: (NOTE)     The eGFR has been calculated using the CKD EPI equation.     This calculation has not been validated in all clinical situations.     eGFR's persistently <90 mL/min signify possible Chronic Kidney     Disease.  BASIC METABOLIC  PANEL     Status: Abnormal   Collection Time    04/29/14  5:37 AM      Result Value Ref Range   Sodium 140  137 - 147 mEq/L   Potassium 4.5  3.7 - 5.3 mEq/L   Chloride 105  96 - 112 mEq/L   CO2 21  19 - 32 mEq/L   Glucose, Bld 154 (*) 70 - 99 mg/dL   BUN 38 (*) 6 - 23 mg/dL   Creatinine, Ser 2.88 (*) 0.50 - 1.35 mg/dL   Calcium 7.9 (*) 8.4 - 10.5 mg/dL   GFR calc non Af Amer 22 (*) >90 mL/min   GFR calc Af Amer 25 (*) >90 mL/min   Comment: (NOTE)     The eGFR has been calculated using the CKD  EPI equation.     This calculation has not been validated in all clinical situations.     eGFR's persistently <90 mL/min signify possible Chronic Kidney     Disease.    US Renal  04/27/2014   CLINICAL DATA:  Acute renal failure.  EXAM: RENAL/URINARY TRACT ULTRASOUND COMPLETE  COMPARISON:  None.  FINDINGS: Right Kidney:  Length: 13.8 cm. Echogenicity within normal limits. No mass or hydronephrosis visualized.  Left Kidney:  No left kidney visualized.  The abdomen and pelvis was scanned.  Bladder:  Appears normal for degree of bladder distention.  IMPRESSION: 1. Normal right kidney and bladder. 2. No left kidney visualized.   Electronically Signed   By: Marcello Moores  Register   On: 04/27/2014 15:09    Review of Systems  Constitutional: Negative for fever.  Respiratory: Negative for shortness of breath.   Cardiovascular: Negative for orthopnea and leg swelling.  Gastrointestinal: Positive for nausea and vomiting. Negative for abdominal pain and diarrhea.  Neurological: Negative for weakness.   Blood pressure 107/86, pulse 100, temperature 97.6 F (36.4 C), temperature source Oral, resp. rate 20, height '5\' 8"'  (1.727 m), weight 105.733 kg (233 lb 1.6 oz), SpO2 100.00%. Physical Exam  Constitutional: No distress.  Eyes: No scleral icterus.  Neck: No JVD present.  Cardiovascular: Normal rate and regular rhythm.   No murmur heard. Respiratory: He has no rales.  GI: There is no tenderness. There is no rebound.  Musculoskeletal: He exhibits no edema.    Assessment/Plan: Problem #1 acute kidney injury : Patient has history of nausea and vomiting. His urine specific gravity is 1.025 which is high. Hence the etiology for his acute kidney injury could be prerenal syndrome. However patient also has some granular cast in the urine raisising the posiblity of  ATN also need to be entertained. Allopurinol also can contribute to the problem. At this moment his renal function very slowly improving. Problem #2  trial fibrillation: His heart rate is controlled Problem #3 history of proteinuria: None nephrotic range. Problem #4 history of gout: Patient has been treated with steroid and did not take  any nonsteroidal. Problem #5 patient wheeze single kidney. His left kidney 13.8. Slightly large possibly compensatory. Problem #6 history of hyperlipidemia. Plan: Agree with hydration Will start patient on Lasix 40 mg IV twice a day We'll check his basic metabolic panel in the morning. We'll repeat his UA today. If patient has proteinuria we may do further workup.  Gevena Cotton Sharlisa Hollifield 04/29/2014, 9:16 AM

## 2014-04-30 LAB — BASIC METABOLIC PANEL
BUN: 44 mg/dL — ABNORMAL HIGH (ref 6–23)
CO2: 22 meq/L (ref 19–32)
CREATININE: 2.89 mg/dL — AB (ref 0.50–1.35)
Calcium: 7.9 mg/dL — ABNORMAL LOW (ref 8.4–10.5)
Chloride: 103 mEq/L (ref 96–112)
GFR calc Af Amer: 25 mL/min — ABNORMAL LOW (ref >90)
GFR, EST NON AFRICAN AMERICAN: 22 mL/min — AB (ref >90)
Glucose, Bld: 121 mg/dL — ABNORMAL HIGH (ref 70–99)
Potassium: 4 mEq/L (ref 3.7–5.3)
SODIUM: 140 meq/L (ref 137–147)

## 2014-04-30 NOTE — Progress Notes (Signed)
NAME:  Dillon Strickland, Dillon Strickland                  ACCOUNT NO.:  MEDICAL RECORD NO.:  [id number]  LOCATION:                                 FACILITY:  PHYSICIAN:  Paula Compton. Willey Blade, MD            DATE OF BIRTH:  DATE OF PROCEDURE:  04/30/2014 DATE OF DISCHARGE:                                PROGRESS NOTE   SUBJECTIVE:  He is feeling well this morning.  He denies any abdominal pain or nausea.  He has had 4500 mL of urine output.  He has been evaluated by Nephrology.  IV Lasix has been added to IV fluids.  OBJECTIVE:  VITAL SIGNS:  Temperature 98.2, pulse 76, blood pressure 101/62. LUNGS:  Clear. HEART:  Irregular. ABDOMEN:  Nontender with no organomegaly.  IMPRESSION/PLAN: 1. Acute renal failure.  BUN and creatinine are 44 and 2.89.  Continue     IV fluids and IV Lasix per Nephrology recommendations.     Electrolytes are normal.  Repeat urinalysis reveals no proteinuria.     His urine culture is negative, has 11 to 20 RBCs on his urinalysis     now. 2. Chronic atrial fibrillation.  Continue Eliquis for now.     Paula Compton. Willey Blade, MD     ROF/MEDQ  D:  04/30/2014  T:  04/30/2014  Job:  [id number]

## 2014-04-30 NOTE — Progress Notes (Signed)
UR chart review completed.  

## 2014-04-30 NOTE — Progress Notes (Signed)
Subjective: Interval History: Patient offer no complaints. He'll have any nausea or vomiting. Patient also denies any difficulty breathing.  Objective: Vital signs in last 24 hours: Temp:  [98 F (36.7 C)-98.2 F (36.8 C)] 98.2 F (36.8 C) (05/22 0445) Pulse Rate:  [76-100] 76 (05/22 0445) Resp:  [20] 20 (05/22 0445) BP: (101-133)/(62-77) 101/62 mmHg (05/22 0445) SpO2:  [94 %-100 %] 94 % (05/22 0445) Weight:  [106.595 kg (235 lb)] 106.595 kg (235 lb) (05/22 0445) Weight change:   Intake/Output from previous day: 05/21 0701 - 05/22 0700 In: 480 [P.O.:480] Out: 4500 [Urine:4500] Intake/Output this shift:    General appearance: alert, cooperative and no distress Resp: clear to auscultation bilaterally Cardio: regular rate and rhythm, S1, S2 normal, no murmur, click, rub or gallop GI: soft, non-tender; bowel sounds normal; no masses,  no organomegaly Extremities: extremities normal, atraumatic, no cyanosis or edema  Lab Results:  Recent Labs  04/27/14 1303  WBC 9.3  HGB 11.6*  HCT 34.2*  PLT 333   BMET:  Recent Labs  04/29/14 0537 04/30/14 0521  NA 140 140  K 4.5 4.0  CL 105 103  CO2 21 22  GLUCOSE 154* 121*  BUN 38* 44*  CREATININE 2.88* 2.89*  CALCIUM 7.9* 7.9*   No results found for this basename: PTH,  in the last 72 hours Iron Studies: No results found for this basename: IRON, TIBC, TRANSFERRIN, FERRITIN,  in the last 72 hours  Studies/Results: No results found.  I have reviewed the patient's current medications.  Assessment/Plan: Problem #1 her acute kidney injury: Presently his renal function is stable. The etiology is thought to be secondary to prerenal syndrome/ATN. Presently patient is none oliguric. His potassium is normal. Problem #2 anemia Problem #3 history of chronic a trial fibrillation: His heart rate is controlled. Problem #4 history of gout: Presently is a symptomatic Problem #5 hypertension his blood pressure is reasonably  controlled Problem #6 proteinuria: Most likely related to his hematuria. Repeat UA did show any proteinuria. Problem #7 patient with a single kidney Plan: DC Lasix Continue his hydration We'll check his basic metabolic panel/uric acid in the morning.    LOS: 3 days   Chilton Sallade S Niko Jakel 04/30/2014,8:25 AM

## 2014-05-01 LAB — BASIC METABOLIC PANEL
BUN: 44 mg/dL — ABNORMAL HIGH (ref 6–23)
CALCIUM: 7.6 mg/dL — AB (ref 8.4–10.5)
CO2: 22 mEq/L (ref 19–32)
Chloride: 107 mEq/L (ref 96–112)
Creatinine, Ser: 2.69 mg/dL — ABNORMAL HIGH (ref 0.50–1.35)
GFR calc non Af Amer: 24 mL/min — ABNORMAL LOW (ref >90)
GFR, EST AFRICAN AMERICAN: 28 mL/min — AB (ref >90)
GLUCOSE: 110 mg/dL — AB (ref 70–99)
Potassium: 4.3 mEq/L (ref 3.7–5.3)
SODIUM: 142 meq/L (ref 137–147)

## 2014-05-01 LAB — URIC ACID: Uric Acid, Serum: 3.7 mg/dL — ABNORMAL LOW (ref 4.0–7.8)

## 2014-05-01 NOTE — Progress Notes (Signed)
Subjective: Patient feels better. His renal function is gradually improving. He is on iv hydration. No no new complaint.  Objective: Vital signs in last 24 hours: Temp:  [97 F (36.1 C)-98 F (36.7 C)] 97.9 F (36.6 C) (05/23 0640) Pulse Rate:  [85-106] 85 (05/23 0640) Resp:  [20] 20 (05/23 0640) BP: (114-121)/(65-83) 121/83 mmHg (05/23 0640) SpO2:  [97 %-99 %] 99 % (05/23 0640) Weight:  [106.913 kg (235 lb 11.2 oz)] 106.913 kg (235 lb 11.2 oz) (05/23 0640) Weight change: 0.318 kg (11.2 oz) Last BM Date: 04/30/14  Intake/Output from previous day: 05/22 0701 - 05/23 0700 In: 720 [P.O.:720] Out: 4025 [Urine:4025]  PHYSICAL EXAM General appearance: alert and no distress Resp: clear to auscultation bilaterally Cardio: S1, S2 normal GI: soft, non-tender; bowel sounds normal; no masses,  no organomegaly Extremities: extremities normal, atraumatic, no cyanosis or edema  Lab Results:    @labtest @ [facility] No results found for this basename: PHART, PCO2, PO2ART, TCO2, HCO3,  in the last 72 hours CULTURES Recent Results (from the past 240 hour(s))  URINE CULTURE     Status: None   Collection Time    04/28/14  8:00 AM      Result Value Ref Range Status   Specimen Description URINE, CLEAN CATCH   Final   Special Requests NONE   Final   Culture  Setup Time     Final   Value: 04/28/2014 10:30     Performed at [facility]     Final   Value: 6,000 COLONIES/ML     Performed at [facility]   Culture     Final   Value: INSIGNIFICANT GROWTH     Performed at [facility]   Report Status 04/29/2014 FINAL   Final   Studies/Results: No results found.  Medications: I have reviewed the patient's current medications.  Assesment: Active Problems:   ARF (acute renal failure)   Acute renal failure    Plan: Medications reviwed Will continue IV hydration Will monitor bmp Continue regular treatment    LOS: 4 days   Dillon Strickland 05/01/2014, 9:20 AM

## 2014-05-01 NOTE — Progress Notes (Signed)
Subjective: Interval History: No new complaint. Appetite is good Objective: Vital signs in last 24 hours: Temp:  [97 F (36.1 C)-98 F (36.7 C)] 97.9 F (36.6 C) (05/23 0640) Pulse Rate:  [85-106] 85 (05/23 0640) Resp:  [20] 20 (05/23 0640) BP: (114-121)/(65-83) 121/83 mmHg (05/23 0640) SpO2:  [97 %-99 %] 99 % (05/23 0640) Weight:  [106.913 kg (235 lb 11.2 oz)] 106.913 kg (235 lb 11.2 oz) (05/23 0640) Weight change: 0.318 kg (11.2 oz)  Intake/Output from previous day: 05/22 0701 - 05/23 0700 In: 720 [P.O.:720] Out: 4025 [Urine:4025] Intake/Output this shift:    General appearance: alert, cooperative and no distress Resp: clear to auscultation bilaterally Cardio: regular rate and rhythm, S1, S2 normal, no murmur, click, rub or gallop GI: soft, non-tender; bowel sounds normal; no masses,  no organomegaly Extremities: extremities normal, atraumatic, no cyanosis or edema  Lab Results: No results found for this basename: WBC, HGB, HCT, PLT,  in the last 72 hours BMET:   Recent Labs  04/30/14 0521 05/01/14 0542  NA 140 142  K 4.0 4.3  CL 103 107  CO2 22 22  GLUCOSE 121* 110*  BUN 44* 44*  CREATININE 2.89* 2.69*  CALCIUM 7.9* 7.6*   No results found for this basename: PTH,  in the last 72 hours Iron Studies: No results found for this basename: IRON, TIBC, TRANSFERRIN, FERRITIN,  in the last 72 hours  Studies/Results: No results found.  I have reviewed the patient's current medications.  Assessment/Plan: Problem #1 her acute kidney injury: Renal function started to improve and patient with good urine out put Problem #2 anemia Problem #3 history of chronic a trial fibrillation: His heart rate is controlled. Problem #4 history of gout: On Alluprinol .No recent gout attack and uric acid level is well controlled Problem #5 hypertension his blood pressure is reasonably controlled Problem #6 proteinuria: Resovled Problem #7 patient with a single kidney Plan:Continue his  hydration We'll check his basic metabolic panel in the morning. Encourage po fluid intake    LOS: 4 days   Brunella Wileman S Sabree Nuon 05/01/2014,7:59 AM

## 2014-05-02 LAB — BASIC METABOLIC PANEL
BUN: 48 mg/dL — AB (ref 6–23)
CALCIUM: 7.4 mg/dL — AB (ref 8.4–10.5)
CO2: 22 mEq/L (ref 19–32)
Chloride: 108 mEq/L (ref 96–112)
Creatinine, Ser: 2.61 mg/dL — ABNORMAL HIGH (ref 0.50–1.35)
GFR, EST AFRICAN AMERICAN: 29 mL/min — AB (ref >90)
GFR, EST NON AFRICAN AMERICAN: 25 mL/min — AB (ref >90)
GLUCOSE: 102 mg/dL — AB (ref 70–99)
Potassium: 4.1 mEq/L (ref 3.7–5.3)
Sodium: 142 mEq/L (ref 137–147)

## 2014-05-02 NOTE — Discharge Summary (Signed)
Physician Discharge Summary  Patient ID: ROMA BIERLEIN MRN: [id number] DOB/AGE: 1951-09-11 63 y.o. Primary Care Physician:FAGAN,ROY, MD Admit date: 04/27/2014 Discharge date: 05/02/2014    Discharge Diagnoses:   Active Problems:   ARF (acute renal failure)   Acute renal failure atrial fibrillation    Medication List         allopurinol 300 MG tablet  Commonly known as:  ZYLOPRIM  Take 300 mg by mouth daily.     ALPRAZolam 0.25 MG tablet  Commonly known as:  XANAX  Take 0.25 mg by mouth 2 (two) times daily as needed.     apixaban 5 MG Tabs tablet  Commonly known as:  ELIQUIS  Take 1 tablet (5 mg total) by mouth 2 (two) times daily.     atenolol 50 MG tablet  Commonly known as:  TENORMIN  TAKE ONE TABLET BY MOUTH EVERY DAY     multivitamins ther. w/minerals Tabs tablet  Take 1 tablet by mouth daily.     predniSONE 10 MG tablet  Commonly known as:  DELTASONE  Take 10 mg by mouth as directed. For gout flare ups     simvastatin 40 MG tablet  Commonly known as:  ZOCOR  Take 40 mg by mouth daily.     traMADol 50 MG tablet  Commonly known as:  ULTRAM  Take 50 mg by mouth every 6 (six) hours as needed for moderate pain.     zolpidem 10 MG tablet  Commonly known as:  AMBIEN  Take 10 mg by mouth at bedtime as needed for sleep.        Discharged Condition: improved    Consults: nephrology  Significant Diagnostic Studies: US Renal  04/27/2014   CLINICAL DATA:  Acute renal failure.  EXAM: RENAL/URINARY TRACT ULTRASOUND COMPLETE  COMPARISON:  None.  FINDINGS: Right Kidney:  Length: 13.8 cm. Echogenicity within normal limits. No mass or hydronephrosis visualized.  Left Kidney:  No left kidney visualized.  The abdomen and pelvis was scanned.  Bladder:  Appears normal for degree of bladder distention.  IMPRESSION: 1. Normal right kidney and bladder. 2. No left kidney visualized.   Electronically Signed   By: Marcello Moores  Register   On: 04/27/2014 15:09    Lab  Results: Basic Metabolic Panel:  Recent Labs  05/01/14 0542 05/02/14 0534  NA 142 142  K 4.3 4.1  CL 107 108  CO2 22 22  GLUCOSE 110* 102*  BUN 44* 48*  CREATININE 2.69* 2.61*  CALCIUM 7.6* 7.4*   Liver Function Tests: No results found for this basename: AST, ALT, ALKPHOS, BILITOT, PROT, ALBUMIN,  in the last 72 hours   CBC: No results found for this basename: WBC, NEUTROABS, HGB, HCT, MCV, PLT,  in the last 72 hours  Recent Results (from the past 240 hour(s))  URINE CULTURE     Status: None   Collection Time    04/28/14  8:00 AM      Result Value Ref Range Status   Specimen Description URINE, CLEAN CATCH   Final   Special Requests NONE   Final   Culture  Setup Time     Final   Value: 04/28/2014 10:30     Performed at [facility]     Final   Value: 6,000 COLONIES/ML     Performed at [facility]   Culture     Final   Value: INSIGNIFICANT GROWTH     Performed at [facility]   Report Status 04/29/2014 FINAL   Final     Hospital Course:   This is a 63 years old male patient with history of atrial fibrillation was admitted due to acute renal injury. Patient was hydrated and duirsed according to nephrology recommendation. He improved and discharged to be followed in out patient.  Discharge Exam: Blood pressure 145/83, pulse 101, temperature 97.7 F (36.5 C), temperature source Oral, resp. rate 20, height 5\' 8"  (1.727 m), weight 105.1 kg (231 lb 11.3 oz), SpO2 98.00%.   Disposition:  home        Follow-up Information   Follow up with [facility] S, MD. Call in 3 days.   Specialty:  Nephrology   Contact information:   [address] [address] [phone]       Signed: Rosita Fire   05/02/2014, 8:51 AM

## 2014-05-02 NOTE — Progress Notes (Signed)
Subjective: Interval History: . Patient denies any difficulty breathing. His appetite is good. Overall he does not have any complaints.  Objective: Vital signs in last 24 hours: Temp:  [97.4 F (36.3 C)-98.1 F (36.7 C)] 97.7 F (36.5 C) (05/24 0247) Pulse Rate:  [79-101] 101 (05/24 0247) Resp:  [20] 20 (05/24 0247) BP: (120-145)/(83-90) 145/83 mmHg (05/24 0247) SpO2:  [97 %-98 %] 98 % (05/24 0247) Weight:  [105.1 kg (231 lb 11.3 oz)] 105.1 kg (231 lb 11.3 oz) (05/24 0500) Weight change: -1.813 kg (-3 lb 15.9 oz)  Intake/Output from previous day: 05/23 0701 - 05/24 0700 In: -  Out: 2550 [Urine:2550] Intake/Output this shift:    General appearance: alert, cooperative and no distress Resp: clear to auscultation bilaterally Cardio: regular rate and rhythm, S1, S2 normal, no murmur, click, rub or gallop GI: soft, non-tender; bowel sounds normal; no masses,  no organomegaly Extremities: extremities normal, atraumatic, no cyanosis or edema  Lab Results: No results found for this basename: WBC, HGB, HCT, PLT,  in the last 72 hours BMET:   Recent Labs  05/01/14 0542 05/02/14 0534  NA 142 142  K 4.3 4.1  CL 107 108  CO2 22 22  GLUCOSE 110* 102*  BUN 44* 48*  CREATININE 2.69* 2.61*  CALCIUM 7.6* 7.4*   No results found for this basename: PTH,  in the last 72 hours Iron Studies: No results found for this basename: IRON, TIBC, TRANSFERRIN, FERRITIN,  in the last 72 hours  Studies/Results: No results found.  I have reviewed the patient's current medications.  Assessment/Plan: Problem #1 her acute kidney injury:. The etiology is thought to be secondary to prerenal syndrome/ATN. presently his renal function is improving very slowly. He is none oliguric and is asymptomatic. Problem #2 anemia: H&H is stable Problem #3 history of chronic a trial fibrillation: His heart rate is controlled. Problem #4 history of gout: Presently is a symptomatic Problem #5 hypertension his blood  pressure is reasonably controlled Problem #6 proteinuria: Has resolved Problem #7 patient with [place] single kidney Plan: Patient advised to increase his fluid intake. Since his renal function is stable patient come to discharged to be followed as an outpatient. We'll check his basic metabolic panel on 3/64 /6803 on Wednesday. We'll continue his hydration for now.    LOS: 5 days   Amoy Steeves S Khadeja Abt 05/02/2014,8:20 AM

## 2014-05-02 NOTE — Progress Notes (Signed)
Patient discharged home.  IV removed - WNL.  Instructed to call Nephrologist office Tuesday AM to set up time for lab work to check BMET. Encouraged to drink plenty of fluids at home and to stay out of sun for long periods of time and to avoid over heating.  Verbalizes understanding.  No changes made to meds.  Patient stable to Dc home at this time.  Left floor via WC with NT.

## 2014-05-04 ENCOUNTER — Telehealth: Payer: Self-pay | Admitting: Cardiology

## 2014-05-04 NOTE — Telephone Encounter (Signed)
Pt wants to stop Eliquis and restart Coumadin.  Trying to find out if Eliquis is making him sick on him stomach.  Told pt to continue Eliquis x 3 days (T,W,Th) and take coumadin 5mg  x 3 days (T,W,Th) then stop Eliquis on 5/29 and restart coumadin at prior dose of 2.5mg  daily except 5mg  on Mondays and Thursdays.  He will have INR checked at [facility] on 6/4.  He verbalized understanding.

## 2014-05-04 NOTE — Progress Notes (Signed)
UR chart review completed.  

## 2014-05-04 NOTE — Telephone Encounter (Signed)
Patient wants to speak with nurse regarding restarting Coumadin / tgs

## 2014-05-13 ENCOUNTER — Other Ambulatory Visit: Payer: Self-pay | Admitting: Cardiology

## 2014-05-14 ENCOUNTER — Ambulatory Visit (INDEPENDENT_AMBULATORY_CARE_PROVIDER_SITE_OTHER): Payer: PRIVATE HEALTH INSURANCE | Admitting: *Deleted

## 2014-05-14 DIAGNOSIS — Z7901 Long term (current) use of anticoagulants: Secondary | ICD-10-CM

## 2014-05-14 DIAGNOSIS — I482 Chronic atrial fibrillation, unspecified: Secondary | ICD-10-CM

## 2014-05-14 DIAGNOSIS — I4891 Unspecified atrial fibrillation: Secondary | ICD-10-CM

## 2014-05-14 LAB — PROTIME-INR
INR: 3.41 — ABNORMAL HIGH (ref <1.50)
Prothrombin Time: 33.4 seconds — ABNORMAL HIGH (ref 11.6–15.2)

## 2014-05-18 ENCOUNTER — Inpatient Hospital Stay (HOSPITAL_COMMUNITY): Payer: PRIVATE HEALTH INSURANCE

## 2014-05-18 ENCOUNTER — Encounter (HOSPITAL_COMMUNITY): Payer: Self-pay | Admitting: *Deleted

## 2014-05-18 ENCOUNTER — Other Ambulatory Visit: Payer: Self-pay | Admitting: Internal Medicine

## 2014-05-18 ENCOUNTER — Inpatient Hospital Stay (HOSPITAL_COMMUNITY)
Admission: AD | Admit: 2014-05-18 | Discharge: 2014-05-21 | DRG: 684 | Disposition: A | Discharge status: Other Acute Inpatient Hospital | Payer: PRIVATE HEALTH INSURANCE | Source: Ambulatory Visit | Attending: Internal Medicine | Admitting: Internal Medicine

## 2014-05-18 DIAGNOSIS — Z833 Family history of diabetes mellitus: Secondary | ICD-10-CM

## 2014-05-18 DIAGNOSIS — Z7901 Long term (current) use of anticoagulants: Secondary | ICD-10-CM

## 2014-05-18 DIAGNOSIS — I129 Hypertensive chronic kidney disease with stage 1 through stage 4 chronic kidney disease, or unspecified chronic kidney disease: Secondary | ICD-10-CM | POA: Diagnosis present

## 2014-05-18 DIAGNOSIS — Z823 Family history of stroke: Secondary | ICD-10-CM

## 2014-05-18 DIAGNOSIS — I4891 Unspecified atrial fibrillation: Secondary | ICD-10-CM | POA: Diagnosis present

## 2014-05-18 DIAGNOSIS — E876 Hypokalemia: Secondary | ICD-10-CM | POA: Diagnosis present

## 2014-05-18 DIAGNOSIS — E7800 Pure hypercholesterolemia, unspecified: Secondary | ICD-10-CM | POA: Diagnosis present

## 2014-05-18 DIAGNOSIS — M109 Gout, unspecified: Secondary | ICD-10-CM | POA: Diagnosis present

## 2014-05-18 DIAGNOSIS — Z8249 Family history of ischemic heart disease and other diseases of the circulatory system: Secondary | ICD-10-CM | POA: Diagnosis not present

## 2014-05-18 DIAGNOSIS — E860 Dehydration: Secondary | ICD-10-CM | POA: Diagnosis present

## 2014-05-18 DIAGNOSIS — E785 Hyperlipidemia, unspecified: Secondary | ICD-10-CM | POA: Diagnosis present

## 2014-05-18 DIAGNOSIS — N182 Chronic kidney disease, stage 2 (mild): Secondary | ICD-10-CM | POA: Diagnosis present

## 2014-05-18 DIAGNOSIS — Z79899 Other long term (current) drug therapy: Secondary | ICD-10-CM | POA: Diagnosis not present

## 2014-05-18 DIAGNOSIS — N179 Acute kidney failure, unspecified: Secondary | ICD-10-CM | POA: Diagnosis present

## 2014-05-18 LAB — URINALYSIS, ROUTINE W REFLEX MICROSCOPIC
Bilirubin Urine: NEGATIVE
Glucose, UA: NEGATIVE mg/dL
KETONES UR: NEGATIVE mg/dL
LEUKOCYTES UA: NEGATIVE
Nitrite: NEGATIVE
PROTEIN: 100 mg/dL — AB
Specific Gravity, Urine: 1.02 (ref 1.005–1.030)
UROBILINOGEN UA: 0.2 mg/dL (ref 0.0–1.0)
pH: 6 (ref 5.0–8.0)

## 2014-05-18 LAB — COMPREHENSIVE METABOLIC PANEL
ALT: 27 U/L (ref 0–53)
AST: 22 U/L (ref 0–37)
Albumin: 2.7 g/dL — ABNORMAL LOW (ref 3.5–5.2)
Alkaline Phosphatase: 181 U/L — ABNORMAL HIGH (ref 39–117)
BUN: 85 mg/dL — AB (ref 6–23)
CALCIUM: 8.7 mg/dL (ref 8.4–10.5)
CO2: 22 meq/L (ref 19–32)
CREATININE: 9.74 mg/dL — AB (ref 0.50–1.35)
Chloride: 91 mEq/L — ABNORMAL LOW (ref 96–112)
GFR, EST AFRICAN AMERICAN: 6 mL/min — AB (ref >90)
GFR, EST NON AFRICAN AMERICAN: 5 mL/min — AB (ref >90)
GLUCOSE: 127 mg/dL — AB (ref 70–99)
Potassium: 3.9 mEq/L (ref 3.7–5.3)
Sodium: 134 mEq/L — ABNORMAL LOW (ref 137–147)
Total Bilirubin: 0.5 mg/dL (ref 0.3–1.2)
Total Protein: 7 g/dL (ref 6.0–8.3)

## 2014-05-18 LAB — MAGNESIUM: MAGNESIUM: 1.9 mg/dL (ref 1.5–2.5)

## 2014-05-18 LAB — CBC
HEMATOCRIT: 30.1 % — AB (ref 39.0–52.0)
HEMOGLOBIN: 10.4 g/dL — AB (ref 13.0–17.0)
MCH: 28.8 pg (ref 26.0–34.0)
MCHC: 34.6 g/dL (ref 30.0–36.0)
MCV: 83.4 fL (ref 78.0–100.0)
Platelets: 401 10*3/uL — ABNORMAL HIGH (ref 150–400)
RBC: 3.61 MIL/uL — AB (ref 4.22–5.81)
RDW: 14 % (ref 11.5–15.5)
WBC: 8.5 10*3/uL (ref 4.0–10.5)

## 2014-05-18 LAB — CREATININE, URINE, RANDOM: CREATININE, URINE: 77.88 mg/dL

## 2014-05-18 LAB — URINE MICROSCOPIC-ADD ON

## 2014-05-18 LAB — PHOSPHORUS: PHOSPHORUS: 9.2 mg/dL — AB (ref 2.3–4.6)

## 2014-05-18 LAB — SODIUM, URINE, RANDOM: Sodium, Ur: 51 mEq/L

## 2014-05-18 MED ORDER — ONDANSETRON HCL 4 MG/2ML IJ SOLN: 4.0000 mg | Freq: Four times a day (QID) | INTRAMUSCULAR | Status: DC | PRN

## 2014-05-18 MED ORDER — ONDANSETRON HCL 4 MG PO TABS: 4.0000 mg | ORAL_TABLET | Freq: Four times a day (QID) | ORAL | Status: DC | PRN

## 2014-05-18 MED ORDER — CALCIUM ACETATE 667 MG PO CAPS
1334.0000 mg | ORAL_CAPSULE | Freq: Three times a day (TID) | ORAL | Status: DC
  Administered 2014-05-18 – 2014-05-21 (×9): 1334 mg via ORAL
  Filled 2014-05-18 (×9): qty 2

## 2014-05-18 MED ORDER — SODIUM CHLORIDE 0.9 % IV SOLN
INTRAVENOUS | Status: DC
  Administered 2014-05-18 – 2014-05-21 (×9): via INTRAVENOUS

## 2014-05-18 MED ORDER — ENOXAPARIN SODIUM 30 MG/0.3ML ~~LOC~~ SOLN
30.0000 mg | SUBCUTANEOUS | Status: DC
  Administered 2014-05-18: 30 mg via SUBCUTANEOUS
  Filled 2014-05-18: qty 0.3

## 2014-05-18 MED ORDER — ALUM & MAG HYDROXIDE-SIMETH 200-200-20 MG/5ML PO SUSP: 30.0000 mL | Freq: Four times a day (QID) | ORAL | Status: DC | PRN

## 2014-05-18 MED ORDER — ZOLPIDEM TARTRATE 5 MG PO TABS
10.0000 mg | ORAL_TABLET | Freq: Every day | ORAL | Status: DC
Start: 2014-05-18 — End: 2014-05-21
  Administered 2014-05-18 – 2014-05-20 (×3): 10 mg via ORAL
  Filled 2014-05-18 (×3): qty 2

## 2014-05-18 MED ORDER — ACETAMINOPHEN 650 MG RE SUPP: 650.0000 mg | Freq: Four times a day (QID) | RECTAL | Status: DC | PRN

## 2014-05-18 MED ORDER — ENOXAPARIN SODIUM 40 MG/0.4ML ~~LOC~~ SOLN: 40.0000 mg | SUBCUTANEOUS | Status: DC

## 2014-05-18 MED ORDER — ATENOLOL 25 MG PO TABS
50.0000 mg | ORAL_TABLET | Freq: Every day | ORAL | Status: DC
  Administered 2014-05-18 – 2014-05-21 (×4): 50 mg via ORAL
  Filled 2014-05-18 (×4): qty 2

## 2014-05-18 MED ORDER — BISACODYL 5 MG PO TBEC
10.0000 mg | DELAYED_RELEASE_TABLET | Freq: Once | ORAL | Status: AC
  Administered 2014-05-18: 10 mg via ORAL

## 2014-05-18 MED ORDER — ACETAMINOPHEN 325 MG PO TABS: 650.0000 mg | ORAL_TABLET | Freq: Four times a day (QID) | ORAL | Status: DC | PRN

## 2014-05-18 NOTE — Consult Note (Signed)
Reason for Consult: Acute kidney injury Referring Physician: Dr.Fegan  Dillon Strickland is an 63 y.o. male.  HPI: He is a patient was history of of atrial fibrillation, gout, recently admitted to the hospital with history of nausea, vomiting and diarrhea and was found to acute kidney injury. Present time possibly to ATN versus prerenal syndrome was entertained. Patient was hydrated his renal function has improved and discharged home to be followed as an outpatient. Since his renal function didn't return to his baseline and also was polyuric patient was being followed with weekly blood work. Patient was encouraged to increase his fluid intake as an outpatient when he was discharged. At the time patient to stay here for a couple of days I want to go home because he has any responsibilities including taking care of his wife. Presently patient denies any difficulty breathing. Has some nausea but no vomiting. His appetite is reasonable. Presently his creatinine has increased significantly he is readmitted for further workup.  Past Medical History  Diagnosis Date  . Chronic atrial fibrillation 2001    Spontaneous contrast on TEE  . Chronic anticoagulation     Colonoscopy with polypectomy in 2006; due for a repeat procedure as of 2012  . Hyperlipidemia   . Pseudogout   . Overweight   . Degenerative joint disease of spine     Mild; cervical and lumbosacral    Past Surgical History  Procedure Laterality Date  . Colonoscopy w/ polypectomy  2006  . Colonoscopy N/A 04/23/2013    Procedure: COLONOSCOPY;  Surgeon: Rogene Houston, MD;  Location: [facility];  Service: Endoscopy;  Laterality: N/A;  1030-rescheduled to 55 Ann notified pt    Family History  Problem Relation Age of Onset  . Stroke Mother   . Cancer Mother   . Heart attack Father   . Arrhythmia Sister     Atrial fibrillation  . Colon cancer Neg Hx   . Arrhythmia Brother   . Arrhythmia Brother   . Diabetes Other   . Heart attack  Other     Social History:  reports that he has never smoked. He does not have any smokeless tobacco history on file. He reports that he drinks alcohol. He reports that he does not use illicit drugs.  Allergies: No Known Allergies  Medications: I have reviewed the patient's current medications.  Results for orders placed during the hospital encounter of 05/18/14 (from the past 48 hour(s))  CBC     Status: Abnormal   Collection Time    05/18/14 10:19 AM      Result Value Ref Range   WBC 8.5  4.0 - 10.5 K/uL   RBC 3.61 (*) 4.22 - 5.81 MIL/uL   Hemoglobin 10.4 (*) 13.0 - 17.0 g/dL   HCT 30.1 (*) 39.0 - 52.0 %   MCV 83.4  78.0 - 100.0 fL   MCH 28.8  26.0 - 34.0 pg   MCHC 34.6  30.0 - 36.0 g/dL   RDW 14.0  11.5 - 15.5 %   Platelets 401 (*) 150 - 400 K/uL  COMPREHENSIVE METABOLIC PANEL     Status: Abnormal   Collection Time    05/18/14 10:19 AM      Result Value Ref Range   Sodium 134 (*) 137 - 147 mEq/L   Potassium 3.9  3.7 - 5.3 mEq/L   Chloride 91 (*) 96 - 112 mEq/L   CO2 22  19 - 32 mEq/L   Glucose, Bld 127 (*)  70 - 99 mg/dL   BUN 85 (*) 6 - 23 mg/dL   Creatinine, Ser 9.74 (*) 0.50 - 1.35 mg/dL   Calcium 8.7  8.4 - 10.5 mg/dL   Total Protein 7.0  6.0 - 8.3 g/dL   Albumin 2.7 (*) 3.5 - 5.2 g/dL   AST 22  0 - 37 U/L   ALT 27  0 - 53 U/L   Alkaline Phosphatase 181 (*) 39 - 117 U/L   Total Bilirubin 0.5  0.3 - 1.2 mg/dL   GFR calc non Af Amer 5 (*) >90 mL/min   GFR calc Af Amer 6 (*) >90 mL/min   Comment: (NOTE)     The eGFR has been calculated using the CKD EPI equation.     This calculation has not been validated in all clinical situations.     eGFR's persistently <90 mL/min signify possible Chronic Kidney     Disease.  MAGNESIUM     Status: None   Collection Time    05/18/14 10:19 AM      Result Value Ref Range   Magnesium 1.9  1.5 - 2.5 mg/dL  PHOSPHORUS     Status: Abnormal   Collection Time    05/18/14 10:19 AM      Result Value Ref Range   Phosphorus 9.2 (*)  2.3 - 4.6 mg/dL    No results found.  Review of Systems  Constitutional: Negative for fever and chills.  Respiratory: Negative for shortness of breath.   Cardiovascular: Negative for orthopnea and leg swelling.  Gastrointestinal: Positive for nausea. Negative for vomiting and abdominal pain.  Neurological: Negative for weakness.   Blood pressure 116/82, pulse 103, temperature 98.2 F (36.8 C), temperature source Oral, resp. rate 18, height _0  (1.727 m), weight 99.202 kg (218 lb 11.2 oz), SpO2 95.00%. Physical Exam  Constitutional: No distress.  Eyes: No scleral icterus.  Neck: No JVD present.  Cardiovascular: Normal rate and regular rhythm.   No murmur heard. Respiratory: No respiratory distress. He has no wheezes. He has no rales.  GI: There is no tenderness.  Musculoskeletal: He exhibits no edema.    Assessment/Plan: Problem #1 renal failure: Acute. Most likely from dehydration associated with oliguric phase of ATN. Presently his creatinine has increased significantly. Patient is still does not have any uremic sign and symptoms. Presently obstructive uropathy also into the differential diagnosis. Problem #2 history of gout: No recent activation. Problem #3 history of a trial fibrillation: His heart rate is controlled. Problem #4 history of hyperlipidemia Problem #5 history of hyperphosphatemia: Most likely related to his renal failure. Problem #6 patient with right single kidney. Problem seems to be familial as other family members have also single kidney. Plan: We'll check his UA today We'll increase his IV fluid to 150 cc per hour Once patient is adequate he hydrated will use diuretics.  We'll do ultrasound of the kidneys. We'll check ANA, hepatitis B surface antigen We'll check also his hepatitis C antibody We'll check complement.  Patient PhosLo 667 mg daily with meals.  Gevena Cotton Dillon Strickland 05/18/2014, 2:20 PM

## 2014-05-18 NOTE — Progress Notes (Signed)
05/18/14 1037 Notified nursing secretary of nephrology consult. Stated will notify Dr. Lowanda Foster of consult. Donavan Foil, RN

## 2014-05-19 LAB — HEPATITIS C ANTIBODY: HCV Ab: NEGATIVE

## 2014-05-19 LAB — BASIC METABOLIC PANEL
BUN: 85 mg/dL — ABNORMAL HIGH (ref 6–23)
CHLORIDE: 96 meq/L (ref 96–112)
CO2: 23 mEq/L (ref 19–32)
Calcium: 8 mg/dL — ABNORMAL LOW (ref 8.4–10.5)
Creatinine, Ser: 9.87 mg/dL — ABNORMAL HIGH (ref 0.50–1.35)
GFR calc Af Amer: 6 mL/min — ABNORMAL LOW (ref >90)
GFR, EST NON AFRICAN AMERICAN: 5 mL/min — AB (ref >90)
GLUCOSE: 105 mg/dL — AB (ref 70–99)
POTASSIUM: 4.2 meq/L (ref 3.7–5.3)
Sodium: 137 mEq/L (ref 137–147)

## 2014-05-19 LAB — PROTIME-INR
INR: 2.1 — ABNORMAL HIGH (ref 0.00–1.49)
Prothrombin Time: 22.9 seconds — ABNORMAL HIGH (ref 11.6–15.2)

## 2014-05-19 LAB — C3 COMPLEMENT: C3 Complement: 184 mg/dL — ABNORMAL HIGH (ref 90–180)

## 2014-05-19 LAB — HEPATITIS B SURFACE ANTIGEN: Hepatitis B Surface Ag: NEGATIVE

## 2014-05-19 LAB — C4 COMPLEMENT: Complement C4, Body Fluid: 47 mg/dL — ABNORMAL HIGH (ref 10–40)

## 2014-05-19 MED ORDER — WARFARIN SODIUM 5 MG PO TABS: 5.0000 mg | ORAL_TABLET | Freq: Every day | ORAL | Status: DC

## 2014-05-19 MED ORDER — ALLOPURINOL 300 MG PO TABS
300.0000 mg | ORAL_TABLET | Freq: Every day | ORAL | Status: DC
  Administered 2014-05-19 – 2014-05-20 (×2): 300 mg via ORAL
  Filled 2014-05-19 (×2): qty 1

## 2014-05-19 MED ORDER — WARFARIN - PHYSICIAN DOSING INPATIENT
Freq: Every day | Status: DC
  Administered 2014-05-19: 17:00:00

## 2014-05-19 MED ORDER — ZOLPIDEM TARTRATE 5 MG PO TABS: 10.0000 mg | ORAL_TABLET | Freq: Every evening | ORAL | Status: DC | PRN

## 2014-05-19 MED ORDER — ATENOLOL 25 MG PO TABS: 50.0000 mg | ORAL_TABLET | Freq: Every day | ORAL | Status: DC

## 2014-05-19 MED ORDER — ENSURE COMPLETE PO LIQD
237.0000 mL | Freq: Two times a day (BID) | ORAL | Status: DC
  Administered 2014-05-20: 237 mL via ORAL

## 2014-05-19 MED ORDER — SIMVASTATIN 20 MG PO TABS
40.0000 mg | ORAL_TABLET | Freq: Every evening | ORAL | Status: DC
  Administered 2014-05-19 – 2014-05-20 (×2): 40 mg via ORAL
  Filled 2014-05-19 (×2): qty 2

## 2014-05-19 MED ORDER — WARFARIN SODIUM 5 MG PO TABS
5.0000 mg | ORAL_TABLET | ORAL | Status: DC
  Filled 2014-05-19: qty 1

## 2014-05-19 MED ORDER — PROCHLORPERAZINE MALEATE 5 MG PO TABS: 10.0000 mg | ORAL_TABLET | Freq: Four times a day (QID) | ORAL | Status: DC | PRN

## 2014-05-19 MED ORDER — TRAMADOL HCL 50 MG PO TABS: 100.0000 mg | ORAL_TABLET | Freq: Every day | ORAL | Status: DC | PRN

## 2014-05-19 NOTE — Progress Notes (Signed)
UR chart review completed.  

## 2014-05-19 NOTE — Progress Notes (Addendum)
Dillon Strickland  MRN: [id number]  DOB/AGE: 12-Jul-1951 63 y.o.  Primary Care Physician:Strickland,ROY, MD  Admit date: 05/18/2014  Chief Complaint: No chief complaint on file.   S-Pt presented on  05/18/2014 with No chief complaint on file. .    Pt c/o lack of appetite.    Pt c/o change in taste "Food doesn't taste well"     Meds . allopurinol  300 mg Oral Daily  . atenolol  50 mg Oral Daily  . calcium acetate  1,334 mg Oral TID WC  . simvastatin  40 mg Oral QPM  . warfarin  5 mg Oral Q24H  . Warfarin - Physician Dosing Inpatient   Does not apply q1800  . zolpidem  10 mg Oral QHS       Physical Exam: Vital signs in last 24 hours: Temp:  [97.6 F (36.4 C)-98.2 F (36.8 C)] 97.6 F (36.4 C) (06/10 1359) Pulse Rate:  [82-108] 82 (06/10 1359) Resp:  [16-20] 16 (06/10 1359) BP: (123-140)/(56-88) 130/56 mmHg (06/10 1359) SpO2:  [95 %-96 %] 95 % (06/10 1359) Weight change:  Last BM Date: 05/19/14  Intake/Output from previous day: 06/09 0701 - 06/10 0700 In: 786.3 [P.O.:480; I.V.:306.3] Out: 1450 [Urine:1450] Total I/O In: 240 [P.O.:240] Out: -    Physical Exam: General- pt is awake,alert, oriented to time place and person Resp- No acute REsp distress, CTA B/L NO Rhonchi CVS- S1S2 Irregular in rate and rhythm GIT- BS+, soft, NT, ND EXT- NO LE Edema, Cyanosis   Lab Results: CBC  Recent Labs  05/18/14 1019  WBC 8.5  HGB 10.4*  HCT 30.1*  PLT 401*    BMET  Recent Labs  05/18/14 1019 05/19/14 0517  NA 134* 137  K 3.9 4.2  CL 91* 96  CO2 22 23  GLUCOSE 127* 105*  BUN 85* 85*  CREATININE 9.74* 9.87*  CALCIUM 8.7 8.0*    Trend 2015 3.18=>2.61=>9.74=>9.87  MICRO No results found for this or any previous visit (from the past 240 hour(s)).    Lab Results  Component Value Date   CALCIUM 8.0* 05/19/2014   PHOS 9.2* 05/18/2014       Impression: 1)Renal  AKI secondary to ATN/ Post renal/AIN                AKI on CKD                AKI  worsening               CKD stage 2 .                CKD secondary to Low glomerular mass as pt has single functioning kidney                    Genetic ( pt sister also has only 1 kidney)                Progression of CKD  Marked with AKI                Proteinura will check.                 Hematuria none.                Nephrolithiasis Hx Absent                 Data against AIN  Complements normal                  NO Eosinophilia                  NO rash                Other work up                   Hepatitis profile negative                  Mild Uremia- pt will need renal replacement therapy.   2)HTN  Medication- On Beta blockers  3)Anemia HGb at goal (9--11)   4)CKD Mineral-Bone Disorder PTH not avail. Secondary Hyperparathyroidism w/u pending  Phosphorus High.   ON Binders   5)Afib Primary MD following  6)Electrolytes  Normokalemic NOrmonatremic   7)Acid base Co2 at goal     Plan:  Will folow BMet in am  Pt does not need renal replacement therapy today but will need it very soon.  If BMet shows no improvement then  Will hold off on coumadin,check INR. Will follow INR and l follow Bmet- depending on thesetwo and clinical symptoms will ask for access placement. Pt start HD, this should help clinically and with platelet dysfunction. Pt will benefit from Renal biopsy. Once Platelet dysfunction better and INR better will ask for biopsy  Pt has only single functioning kidney and Afib . I have discussed risks/benefits of holding coumadin. I have discussed risks/benefit of renal biopsy. Will ask for Ana, anca profile  Will ask for  24 hr urine Proteinuria  Will ask for spep, upep, ife.    Dillon Strickland S 05/19/2014, 3:33 PM

## 2014-05-19 NOTE — Progress Notes (Signed)
Patient states that Dr Theador Hawthorne had told him that Coumadin would be held at this time, however medication is ordered to give. Spoke with Dr Luan Pulling, order to hold coumadin for now per Dr Toya Smothers note.

## 2014-05-19 NOTE — Progress Notes (Signed)
NUTRITION FOLLOW UP  Intervention:   Ensure Complete po BID, each supplement provides 350 kcal and 13 grams of protein   Nutrition Dx:   1. Malnutrition (moderate); ongoing 2.Inadeuquate oral intake; ongoing  Goal:  Pt to meet >/= 90% of their estimated nutrition needs    Monitor:  Po intake, labs and wt trends   Assessment:  Pt assessed by RD on 5/20 when he was hospitalized due to decreased appetite, nausea and vomiting 10 days prior to admission. Very little po intake and found with acute renal failure and dehydrated at admission. Home diet is regular. Feeds himself. Meets criteria for moderate malnutrition in the context of  Chronic illness.   Pt has experienced additional significant weight loss of 5%,14# in <30 days, 10%, 24# in 2 months which is significant.  Height: Ht Readings from Last 1 Encounters:  05/18/14 5\' 8"  (1.727 m)    Weight Status:   Wt Readings from Last 1 Encounters:  05/18/14 218 lb 11.2 oz (99.202 kg)  04/28/14 weight- 233# 03/19/14 weight- 243#  Re-estimated needs:  Kcal: 1900-2200 Protein: 99 gr Fluid: >2000 ml daily  Skin: intact  Diet Order: General po 100% breakfast today   Intake/Output Summary (Last 24 hours) at 05/19/14 1227 Last data filed at 05/19/14 0800  Gross per 24 hour  Intake 1026.25 ml  Output   1450 ml  Net -423.75 ml    Last BM: 05/19/14    Labs:   Recent Labs Lab 05/18/14 1019 05/19/14 0517  NA 134* 137  K 3.9 4.2  CL 91* 96  CO2 22 23  BUN 85* 85*  CREATININE 9.74* 9.87*  CALCIUM 8.7 8.0*  MG 1.9  --   PHOS 9.2*  --   GLUCOSE 127* 105*    CBG (last 3)  No results found for this basename: GLUCAP,  in the last 72 hours  Scheduled Meds: . allopurinol  300 mg Oral Daily  . atenolol  50 mg Oral Daily  . calcium acetate  1,334 mg Oral TID WC  . simvastatin  40 mg Oral QPM  . warfarin  5 mg Oral Q24H  . Warfarin - Physician Dosing Inpatient   Does not apply q1800  . zolpidem  10 mg Oral QHS     Continuous Infusions: . sodium chloride 150 mL/hr at 05/19/14 0658   Colman Cater MS,RD,CSG,LDN Office: #[phone] Pager: [phone]

## 2014-05-19 NOTE — Care Management Note (Addendum)
    Page 1 of 1   05/21/2014     9:55:13 AM CARE MANAGEMENT NOTE 05/21/2014  Patient:  Dillon, Strickland   Account Number:  [id number]  Date Initiated:  05/19/2014  Documentation initiated by:  Theophilus Kinds  Subjective/Objective Assessment:   Pt admitted from home with acute renal failure. Pt lives with his wife and will return home at discharge. 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 Pt is indepdent with ADL's.     Action/Plan:   No CM needs noted.   Anticipated DC Date:  05/24/2014   Anticipated DC Plan:  [place]  CM consult      Choice offered to / List presented to:             Status of service:  Completed, signed off Medicare Important Message given?   (If response is "NO", the following Medicare IM given date fields will be blank) Date Medicare IM given:   Date Additional Medicare IM given:    Discharge Disposition:  ACUTE TO ACUTE TRANS  Per UR Regulation:    If discussed at Long Length of Stay Meetings, dates discussed:    Comments:  05/21/14 [place], RN BSN CM Pt transfer to [facility] for furthur workup of kidney disease. No other CM needs noted.  05/19/14 [place], RN BSN CM

## 2014-05-19 NOTE — H&P (Signed)
PCP:   Asencion Noble, MD   Chief Complaint:  Kidney failure  HPI: This patient is a 63 year old male who presented with a rising BUN and creatinine. He had recently been hospitalized with acute renal failure. Creatinine improved to less than 3. He was found to have a single kidney on the right. He had red cells in the urine with minimal white cells. He has had 2 negative urine cultures. Metabolic profile was rechecked after last week when his creatinine was up to 4 and was found to be 8. This was discussed with nephrology and he is being readmitted for further evaluation. He has passed large amounts of urine. He now has greater 300 mg per DL of protein. He denies flank pain, abdominal pain, fever or vomiting.    Past Medical History  Diagnosis Date  . Chronic atrial fibrillation 2001    Spontaneous contrast on TEE  . Chronic anticoagulation     Colonoscopy with polypectomy in 2006; due for a repeat procedure as of 2012  . Hyperlipidemia   . Pseudogout   . Overweight   . Degenerative joint disease of spine     Mild; cervical and lumbosacral     Past Surgical History  Procedure Laterality Date  . Colonoscopy w/ polypectomy  2006  . Colonoscopy N/A 04/23/2013    Procedure: COLONOSCOPY;  Surgeon: Rogene Houston, MD;  Location: [facility];  Service: Endoscopy;  Laterality: N/A;  1030-rescheduled to 56 Ann notified pt     Prior to Admission medications   Medication Sig Start Date End Date Taking? Authorizing Provider  allopurinol (ZYLOPRIM) 300 MG tablet Take 300 mg by mouth daily.   Yes Historical Provider, MD  atenolol (TENORMIN) 50 MG tablet TAKE ONE TABLET BY MOUTH EVERY DAY 02/17/14  Yes Arnoldo Lenis, MD  predniSONE (DELTASONE) 10 MG tablet Take 10 mg by mouth as needed (for gout flare ups).    Yes Historical Provider, MD  prochlorperazine (COMPAZINE) 10 MG tablet Take 10 mg by mouth every 6 (six) hours as needed for nausea or vomiting.   Yes  Historical Provider, MD  simvastatin (ZOCOR) 40 MG tablet Take 40 mg by mouth daily. 04/21/14  Yes Arnoldo Lenis, MD  traMADol (ULTRAM) 50 MG tablet Take 100 mg by mouth daily as needed (sleep).    Yes Historical Provider, MD  warfarin (COUMADIN) 5 MG tablet Take 5 mg by mouth daily. 5 mg two days a week and 2.5 mg the rest of the week.   Yes Historical Provider, MD  zolpidem (AMBIEN) 10 MG tablet Take 10 mg by mouth at bedtime as needed for sleep.   Yes Historical Provider, MD  Multiple Vitamins-Minerals (MULTIVITAMINS THER. W/MINERALS) TABS Take 1 tablet by mouth daily.    Historical Provider, MD      Allergies:  No Known Allergies   Social History:  reports that he has never smoked. He does not have any smokeless tobacco history on file. He reports that he drinks alcohol. He reports that he does not use illicit drugs.  Family History  Problem Relation Age of Onset  . Stroke Mother   . Cancer Mother   . Heart attack Father   . Arrhythmia Sister     Atrial fibrillation  . Colon cancer Neg Hx   . Arrhythmia Brother   . Arrhythmia Brother   . Diabetes Other   .  Heart attack Other     Review of Systems:  _0 @  Physical Exam: Blood pressure 123/80, pulse 103, temperature 97.9 F (36.6 C), temperature source Oral, resp. rate 16, height _1  (1.727 m), weight 218 lb 11.2 oz (99.202 kg), SpO2 95.00%. No distress. HEENT: Eyes, nose and pharynx appear normal. Mucous membranes are moist. Neck reveals no JVD, thyromegaly or lymphadenopathy. Lungs clear. Heart irregularly irregular. Abdomen soft and nontender with no organomegaly or CVA tenderness. Extremities reveal normal pulses with no clubbing or edema. Neuro exam revealed no focal weakness. Lymph nodes reveal no cervical or supraclavicular adenopathy. Skin is warm and dry.  Labs on Admission:  Results for orders placed during the hospital encounter of 05/18/14 (from the past 48 hour(s))  CBC     Status: Abnormal    Collection Time    05/18/14 10:19 AM      Result Value Ref Range   WBC 8.5  4.0 - 10.5 K/uL   RBC 3.61 (*) 4.22 - 5.81 MIL/uL   Hemoglobin 10.4 (*) 13.0 - 17.0 g/dL   HCT 30.1 (*) 39.0 - 52.0 %   MCV 83.4  78.0 - 100.0 fL   MCH 28.8  26.0 - 34.0 pg   MCHC 34.6  30.0 - 36.0 g/dL   RDW 14.0  11.5 - 15.5 %   Platelets 401 (*) 150 - 400 K/uL  COMPREHENSIVE METABOLIC PANEL     Status: Abnormal   Collection Time    05/18/14 10:19 AM      Result Value Ref Range   Sodium 134 (*) 137 - 147 mEq/L   Potassium 3.9  3.7 - 5.3 mEq/L   Chloride 91 (*) 96 - 112 mEq/L   CO2 22  19 - 32 mEq/L   Glucose, Bld 127 (*) 70 - 99 mg/dL   BUN 85 (*) 6 - 23 mg/dL   Creatinine, Ser 9.74 (*) 0.50 - 1.35 mg/dL   Calcium 8.7  8.4 - 10.5 mg/dL   Total Protein 7.0  6.0 - 8.3 g/dL   Albumin 2.7 (*) 3.5 - 5.2 g/dL   AST 22  0 - 37 U/L   ALT 27  0 - 53 U/L   Alkaline Phosphatase 181 (*) 39 - 117 U/L   Total Bilirubin 0.5  0.3 - 1.2 mg/dL   GFR calc non Af Amer 5 (*) >90 mL/min   GFR calc Af Amer 6 (*) >90 mL/min   Comment: (NOTE)     The eGFR has been calculated using the CKD EPI equation.     This calculation has not been validated in all clinical situations.     eGFR's persistently <90 mL/min signify possible Chronic Kidney     Disease.  MAGNESIUM     Status: None   Collection Time    05/18/14 10:19 AM      Result Value Ref Range   Magnesium 1.9  1.5 - 2.5 mg/dL  PHOSPHORUS     Status: Abnormal   Collection Time    05/18/14 10:19 AM      Result Value Ref Range   Phosphorus 9.2 (*) 2.3 - 4.6 mg/dL  HEPATITIS B SURFACE ANTIGEN     Status: None   Collection Time    05/18/14  2:49 PM      Result Value Ref Range   Hepatitis B Surface Ag NEGATIVE  NEGATIVE   Comment: Performed at [facility] ANTIBODY     Status: None   Collection  Time    05/18/14  2:49 PM      Result Value Ref Range   HCV Ab NEGATIVE  NEGATIVE   Comment: Performed at [place]      Status: Abnormal   Collection Time    05/18/14  2:49 PM      Result Value Ref Range   Complement C4, Body Fluid 47 (*) 10 - 40 mg/dL   Comment: Performed at [place]     Status: Abnormal   Collection Time    05/18/14  2:49 PM      Result Value Ref Range   C3 Complement 184 (*) 90 - 180 mg/dL   Comment: Performed at [facility]     Status: Abnormal   Collection Time    05/18/14  2:55 PM      Result Value Ref Range   Color, Urine YELLOW  YELLOW   APPearance HAZY (*) CLEAR   Specific Gravity, Urine 1.020  1.005 - 1.030   pH 6.0  5.0 - 8.0   Glucose, UA NEGATIVE  NEGATIVE mg/dL   Hgb urine dipstick LARGE (*) NEGATIVE   Bilirubin Urine NEGATIVE  NEGATIVE   Ketones, ur NEGATIVE  NEGATIVE mg/dL   Protein, ur 100 (*) NEGATIVE mg/dL   Urobilinogen, UA 0.2  0.0 - 1.0 mg/dL   Nitrite NEGATIVE  NEGATIVE   Leukocytes, UA NEGATIVE  NEGATIVE  CREATININE, URINE, RANDOM     Status: None   Collection Time    05/18/14  2:55 PM      Result Value Ref Range   Creatinine, Urine 77.88    SODIUM, URINE, RANDOM     Status: None   Collection Time    05/18/14  2:55 PM      Result Value Ref Range   Sodium, Ur 51    URINE MICROSCOPIC-ADD ON     Status: Abnormal   Collection Time    05/18/14  2:55 PM      Result Value Ref Range   Squamous Epithelial / LPF RARE  RARE   WBC, UA 3-6  <3 WBC/hpf   RBC / HPF TOO NUMEROUS TO COUNT  <3 RBC/hpf   Bacteria, UA FEW (*) RARE  BASIC METABOLIC PANEL     Status: Abnormal   Collection Time    05/19/14  5:17 AM      Result Value Ref Range   Sodium 137  137 - 147 mEq/L   Potassium 4.2  3.7 - 5.3 mEq/L   Chloride 96  96 - 112 mEq/L   CO2 23  19 - 32 mEq/L   Glucose, Bld 105 (*) 70 - 99 mg/dL   BUN 85 (*) 6 - 23 mg/dL   Creatinine, Ser 9.87 (*) 0.50 - 1.35 mg/dL   Calcium 8.0 (*) 8.4 - 10.5 mg/dL   GFR calc non Af Amer 5 (*) >90 mL/min   GFR calc Af Amer 6 (*) >90 mL/min    Comment: (NOTE)     The eGFR has been calculated using the CKD EPI equation.     This calculation has not been validated in all clinical situations.     eGFR's persistently <90 mL/min signify possible Chronic Kidney     Disease.    Radiological Exams on Admission: US Renal  05/18/2014   CLINICAL DATA:  Worsening renal failure  EXAM: RENAL/URINARY TRACT ULTRASOUND COMPLETE  COMPARISON:  04/27/2014  FINDINGS:  Right Kidney:  Length: 15 cm, larger than previous when it measured 14 cm. The hilar fat also appears more effaced. There is diffuse increased cortical echogenicity. No hydronephrosis or evidence of solid renal mass.  Left Kidney:  Not visualized.  No indication of nephrectomy.  Bladder:  Appears normal for degree of bladder distention.  IMPRESSION: Solitary right kidney with increasing size and echogenicity. These nonspecific findings can be seen with pyelonephritis or inflammatory nephritis. No hydronephrosis.   Electronically Signed   By: Jorje Guild M.D.   On: 05/18/2014 17:55    Assessment/Plan #1. Acute renal failure. He is being hospitalized for nephrology consultation. We'll start IV hydration. Case discussed with nephrology. #2. Chronic atrial fibrillation. #3. Gout. #4. Proteinuria and hematuria. Renal biopsy may be required. Active Problems:   ARF (acute renal failure)    Dillon Strickland    05/19/2014

## 2014-05-19 NOTE — Progress Notes (Signed)
Subjective: He feels well this morning. He denies nausea. He is producing urine well.  Objective: Vital signs in last 24 hours: Filed Vitals:   05/18/14 1034 05/18/14 1420 05/18/14 2126 05/19/14 0604  BP:  115/72 140/88 123/80  Pulse:  101 108 103  Temp:  97.7 F (36.5 C) 98.2 F (36.8 C) 97.9 F (36.6 C)  TempSrc:  Oral Oral Oral  Resp:  18 20 16   Height:      Weight: 218 lb 11.2 oz (99.202 kg)     SpO2:  95% 96% 95%   Weight change:   Intake/Output Summary (Last 24 hours) at 05/19/14 0756 Last data filed at 05/19/14 0600  Gross per 24 hour  Intake 786.25 ml  Output   1450 ml  Net -663.75 ml    Physical Exam: No distress. Pharynx moist. Lungs clear. Heart irregularly irregular. Abdomen soft nontender with no organomegaly. Extremities reveal no edema.  Lab Results:    Results for orders placed during the hospital encounter of 05/18/14 (from the past 24 hour(s))  CBC     Status: Abnormal   Collection Time    05/18/14 10:19 AM      Result Value Ref Range   WBC 8.5  4.0 - 10.5 K/uL   RBC 3.61 (*) 4.22 - 5.81 MIL/uL   Hemoglobin 10.4 (*) 13.0 - 17.0 g/dL   HCT 30.1 (*) 39.0 - 52.0 %   MCV 83.4  78.0 - 100.0 fL   MCH 28.8  26.0 - 34.0 pg   MCHC 34.6  30.0 - 36.0 g/dL   RDW 14.0  11.5 - 15.5 %   Platelets 401 (*) 150 - 400 K/uL  COMPREHENSIVE METABOLIC PANEL     Status: Abnormal   Collection Time    05/18/14 10:19 AM      Result Value Ref Range   Sodium 134 (*) 137 - 147 mEq/L   Potassium 3.9  3.7 - 5.3 mEq/L   Chloride 91 (*) 96 - 112 mEq/L   CO2 22  19 - 32 mEq/L   Glucose, Bld 127 (*) 70 - 99 mg/dL   BUN 85 (*) 6 - 23 mg/dL   Creatinine, Ser 9.74 (*) 0.50 - 1.35 mg/dL   Calcium 8.7  8.4 - 10.5 mg/dL   Total Protein 7.0  6.0 - 8.3 g/dL   Albumin 2.7 (*) 3.5 - 5.2 g/dL   AST 22  0 - 37 U/L   ALT 27  0 - 53 U/L   Alkaline Phosphatase 181 (*) 39 - 117 U/L   Total Bilirubin 0.5  0.3 - 1.2 mg/dL   GFR calc non Af Amer 5 (*) >90 mL/min   GFR calc Af Amer 6  (*) >90 mL/min  MAGNESIUM     Status: None   Collection Time    05/18/14 10:19 AM      Result Value Ref Range   Magnesium 1.9  1.5 - 2.5 mg/dL  PHOSPHORUS     Status: Abnormal   Collection Time    05/18/14 10:19 AM      Result Value Ref Range   Phosphorus 9.2 (*) 2.3 - 4.6 mg/dL  HEPATITIS B SURFACE ANTIGEN     Status: None   Collection Time    05/18/14  2:49 PM      Result Value Ref Range   Hepatitis B Surface Ag NEGATIVE  NEGATIVE  HEPATITIS C ANTIBODY     Status: None   Collection Time    05/18/14  2:49 PM      Result Value Ref Range   HCV Ab NEGATIVE  NEGATIVE  C4 COMPLEMENT     Status: Abnormal   Collection Time    05/18/14  2:49 PM      Result Value Ref Range   Complement C4, Body Fluid 47 (*) 10 - 40 mg/dL  C3 COMPLEMENT     Status: Abnormal   Collection Time    05/18/14  2:49 PM      Result Value Ref Range   C3 Complement 184 (*) 90 - 180 mg/dL  URINALYSIS, ROUTINE W REFLEX MICROSCOPIC     Status: Abnormal   Collection Time    05/18/14  2:55 PM      Result Value Ref Range   Color, Urine YELLOW  YELLOW   APPearance HAZY (*) CLEAR   Specific Gravity, Urine 1.020  1.005 - 1.030   pH 6.0  5.0 - 8.0   Glucose, UA NEGATIVE  NEGATIVE mg/dL   Hgb urine dipstick LARGE (*) NEGATIVE   Bilirubin Urine NEGATIVE  NEGATIVE   Ketones, ur NEGATIVE  NEGATIVE mg/dL   Protein, ur 100 (*) NEGATIVE mg/dL   Urobilinogen, UA 0.2  0.0 - 1.0 mg/dL   Nitrite NEGATIVE  NEGATIVE   Leukocytes, UA NEGATIVE  NEGATIVE  CREATININE, URINE, RANDOM     Status: None   Collection Time    05/18/14  2:55 PM      Result Value Ref Range   Creatinine, Urine 77.88    SODIUM, URINE, RANDOM     Status: None   Collection Time    05/18/14  2:55 PM      Result Value Ref Range   Sodium, Ur 51    URINE MICROSCOPIC-ADD ON     Status: Abnormal   Collection Time    05/18/14  2:55 PM      Result Value Ref Range   Squamous Epithelial / LPF RARE  RARE   WBC, UA 3-6  <3 WBC/hpf   RBC / HPF TOO NUMEROUS  TO COUNT  <3 RBC/hpf   Bacteria, UA FEW (*) RARE  BASIC METABOLIC PANEL     Status: Abnormal   Collection Time    05/19/14  5:17 AM      Result Value Ref Range   Sodium 137  137 - 147 mEq/L   Potassium 4.2  3.7 - 5.3 mEq/L   Chloride 96  96 - 112 mEq/L   CO2 23  19 - 32 mEq/L   Glucose, Bld 105 (*) 70 - 99 mg/dL   BUN 85 (*) 6 - 23 mg/dL   Creatinine, Ser 9.87 (*) 0.50 - 1.35 mg/dL   Calcium 8.0 (*) 8.4 - 10.5 mg/dL   GFR calc non Af Amer 5 (*) >90 mL/min   GFR calc Af Amer 6 (*) >90 mL/min     ABGS No results found for this basename: PHART, PCO2, PO2ART, TCO2, HCO3,  in the last 72 hours CULTURES No results found for this or any previous visit (from the past 240 hour(s)). Studies/Results: US Renal  05/18/2014   CLINICAL DATA:  Worsening renal failure  EXAM: RENAL/URINARY TRACT ULTRASOUND COMPLETE  COMPARISON:  04/27/2014  FINDINGS: Right Kidney:  Length: 15 cm, larger than previous when it measured 14 cm. The hilar fat also appears more effaced. There is diffuse increased cortical echogenicity. No hydronephrosis or evidence of solid renal mass.  Left Kidney:  Not visualized.  No indication of nephrectomy.  Bladder:  Appears normal for degree of bladder distention.  IMPRESSION: Solitary right kidney with increasing size and echogenicity. These nonspecific findings can be seen with pyelonephritis or inflammatory nephritis. No hydronephrosis.   Electronically Signed   By: Jorje Guild M.D.   On: 05/18/2014 17:55   Micro Results: No results found for this or any previous visit (from the past 240 hour(s)). Studies/Results: US Renal  05/18/2014   CLINICAL DATA:  Worsening renal failure  EXAM: RENAL/URINARY TRACT ULTRASOUND COMPLETE  COMPARISON:  04/27/2014  FINDINGS: Right Kidney:  Length: 15 cm, larger than previous when it measured 14 cm. The hilar fat also appears more effaced. There is diffuse increased cortical echogenicity. No hydronephrosis or evidence of solid renal mass.  Left  Kidney:  Not visualized.  No indication of nephrectomy.  Bladder:  Appears normal for degree of bladder distention.  IMPRESSION: Solitary right kidney with increasing size and echogenicity. These nonspecific findings can be seen with pyelonephritis or inflammatory nephritis. No hydronephrosis.   Electronically Signed   By: Jorje Guild M.D.   On: 05/18/2014 17:55   Medications:  I have reviewed the patient's current medications Scheduled Meds: . allopurinol  300 mg Oral Daily  . atenolol  50 mg Oral Daily  . calcium acetate  1,334 mg Oral TID WC  . simvastatin  40 mg Oral QPM  . Warfarin - Physician Dosing Inpatient   Does not apply q1800  . zolpidem  10 mg Oral QHS   Continuous Infusions: . sodium chloride 150 mL/hr at 05/19/14 0658   PRN Meds:.acetaminophen, acetaminophen, alum & mag hydroxide-simeth, ondansetron (ZOFRAN) IV, ondansetron, prochlorperazine, traMADol, zolpidem   Assessment/Plan: #1. Acute renal failure. Ultrasound reveals no hydronephrosis. Creatinine is minimally changed to 9.8. Electrolytes are normal. Continue IV fluids. Will discuss further with nephrology. #2. Atrial fibrillation. Continue atenolol. Check INR on Coumadin.  Hepatitis C antibody is negative. Hepatitis B surface antigen is negative. Active Problems:   ARF (acute renal failure)     LOS: 1 day   Dillon Strickland 05/19/2014, 7:56 AM

## 2014-05-20 LAB — BASIC METABOLIC PANEL
BUN: 81 mg/dL — AB (ref 6–23)
CO2: 20 mEq/L (ref 19–32)
CREATININE: 9.37 mg/dL — AB (ref 0.50–1.35)
Calcium: 8.4 mg/dL (ref 8.4–10.5)
Chloride: 100 mEq/L (ref 96–112)
GFR calc Af Amer: 6 mL/min — ABNORMAL LOW (ref >90)
GFR, EST NON AFRICAN AMERICAN: 5 mL/min — AB (ref >90)
GLUCOSE: 97 mg/dL (ref 70–99)
Potassium: 3.8 mEq/L (ref 3.7–5.3)
Sodium: 139 mEq/L (ref 137–147)

## 2014-05-20 LAB — ANCA SCREEN W REFLEX TITER
Atypical p-ANCA Screen: NEGATIVE
c-ANCA Screen: NEGATIVE
p-ANCA Screen: POSITIVE — AB

## 2014-05-20 LAB — MPO/PR-3 (ANCA) ANTIBODIES
Myeloperoxidase Abs: >8 — ABNORMAL HIGH
Serine Protease 3: <1

## 2014-05-20 LAB — ANCA TITERS: P-ANCA: 1:640 {titer} — ABNORMAL HIGH

## 2014-05-20 LAB — PROTIME-INR
INR: 2.02 — ABNORMAL HIGH (ref 0.00–1.49)
Prothrombin Time: 22.2 seconds — ABNORMAL HIGH (ref 11.6–15.2)

## 2014-05-20 LAB — ANA: Anti Nuclear Antibody(ANA): NEGATIVE

## 2014-05-20 MED ORDER — FUROSEMIDE 10 MG/ML IJ SOLN
80.0000 mg | Freq: Two times a day (BID) | INTRAMUSCULAR | Status: DC
  Administered 2014-05-20 – 2014-05-21 (×3): 80 mg via INTRAVENOUS
  Filled 2014-05-20 (×3): qty 8

## 2014-05-20 NOTE — Progress Notes (Signed)
Subjective: Interval History: has complaints of nausea but no vomiting. Patient denies any difficulty in breathing  Objective: Vital signs in last 24 hours: Temp:  [97.6 F (36.4 C)-98.7 F (37.1 C)] 98.7 F (37.1 C) (06/11 0412) Pulse Rate:  [82-106] 92 (06/11 0412) Resp:  [16-20] 20 (06/11 0412) BP: (100-130)/(54-71) 100/54 mmHg (06/11 0412) SpO2:  [95 %-97 %] 96 % (06/11 0412) Weight:  [103.329 kg (227 lb 12.8 oz)] 103.329 kg (227 lb 12.8 oz) (06/11 0412) Weight change: 4.128 kg (9 lb 1.6 oz)  Intake/Output from previous day: 06/10 0701 - 06/11 0700 In: 4045 [P.O.:480; I.V.:3565] Out: 2400 [Urine:2400] Intake/Output this shift: Total I/O In: -  Out: 400 [Urine:400]  General appearance: alert, cooperative and no distress Resp: clear to auscultation bilaterally Cardio: regular rate and rhythm, S1, S2 normal, no murmur, click, rub or gallop GI: soft, non-tender; bowel sounds normal; no masses,  no organomegaly Extremities: extremities normal, atraumatic, no cyanosis or edema  Lab Results:  Recent Labs  05/18/14 1019  WBC 8.5  HGB 10.4*  HCT 30.1*  PLT 401*   BMET:  Recent Labs  05/19/14 0517 05/20/14 0637  NA 137 139  K 4.2 3.8  CL 96 100  CO2 23 20  GLUCOSE 105* 97  BUN 85* 81*  CREATININE 9.87* 9.37*  CALCIUM 8.0* 8.4   No results found for this basename: PTH,  in the last 72 hours Iron Studies: No results found for this basename: IRON, TIBC, TRANSFERRIN, FERRITIN,  in the last 72 hours  Studies/Results: US Renal  05/18/2014   CLINICAL DATA:  Worsening renal failure  EXAM: RENAL/URINARY TRACT ULTRASOUND COMPLETE  COMPARISON:  04/27/2014  FINDINGS: Right Kidney:  Length: 15 cm, larger than previous when it measured 14 cm. The hilar fat also appears more effaced. There is diffuse increased cortical echogenicity. No hydronephrosis or evidence of solid renal mass.  Left Kidney:  Not visualized.  No indication of nephrectomy.  Bladder:  Appears normal for  degree of bladder distention.  IMPRESSION: Solitary right kidney with increasing size and echogenicity. These nonspecific findings can be seen with pyelonephritis or inflammatory nephritis. No hydronephrosis.   Electronically Signed   By: Jorje Guild M.D.   On: 05/18/2014 17:55    I have reviewed the patient's current medications.  Assessment/Plan:  problem #1 acute kidney injury: His BUN and creatinine slightly better. Patient presently none oliguric. Etiology could be ATN/he prerenal. Presently his ultrasound didn't show any obstruction. Secondary workup presently is negative. Problem #2 a trial fibrillation his heart rate is controlled Problem #3 history of gout Problem #4 metabolic bone disease: Calcium is range and patient was in a binder. Problem #5 hypercholesterolemia  Plan:we'll continue with hydration Lasix 80 mg iv bid We'll check his basic metabolic panel, phosphorus in the morning.    LOS: 2 days   Emberlie Gotcher S 05/20/2014,7:44 AM

## 2014-05-20 NOTE — Progress Notes (Signed)
UR chart review completed.  

## 2014-05-20 NOTE — Progress Notes (Signed)
Subjective: He feels relatively well this morning. He denies any nausea. Urine output was 2400 mL.  Objective: Vital signs in last 24 hours: Filed Vitals:   05/19/14 0604 05/19/14 1359 05/19/14 2109 05/20/14 0412  BP: 123/80 130/56 112/71 100/54  Pulse: 103 82 106 92  Temp: 97.9 F (36.6 C) 97.6 F (36.4 C) 98 F (36.7 C) 98.7 F (37.1 C)  TempSrc: Oral Oral Oral Oral  Resp: 16 16 20 20   Height:      Weight:    227 lb 12.8 oz (103.329 kg)  SpO2: 95% 95% 97% 96%   Weight change: 9 lb 1.6 oz (4.128 kg)  Intake/Output Summary (Last 24 hours) at 05/20/14 0741 Last data filed at 05/20/14 0644  Gross per 24 hour  Intake   4045 ml  Output   2400 ml  Net   1645 ml    Physical Exam: Awake and alert. Lungs clear. Irregular with no murmurs. Abdomen soft and nontender with no organomegaly. Extremities revealed no edema.  Lab Results:    Results for orders placed during the hospital encounter of 05/18/14 (from the past 24 hour(s))  PROTIME-INR     Status: Abnormal   Collection Time    05/20/14  6:37 AM      Result Value Ref Range   Prothrombin Time 22.2 (*) 11.6 - 15.2 seconds   INR 2.02 (*) 0.00 - 1.76  BASIC METABOLIC PANEL     Status: Abnormal   Collection Time    05/20/14  6:37 AM      Result Value Ref Range   Sodium 139  137 - 147 mEq/L   Potassium 3.8  3.7 - 5.3 mEq/L   Chloride 100  96 - 112 mEq/L   CO2 20  19 - 32 mEq/L   Glucose, Bld 97  70 - 99 mg/dL   BUN 81 (*) 6 - 23 mg/dL   Creatinine, Ser 9.37 (*) 0.50 - 1.35 mg/dL   Calcium 8.4  8.4 - 10.5 mg/dL   GFR calc non Af Amer 5 (*) >90 mL/min   GFR calc Af Amer 6 (*) >90 mL/min     ABGS No results found for this basename: PHART, PCO2, PO2ART, TCO2, HCO3,  in the last 72 hours CULTURES No results found for this or any previous visit (from the past 240 hour(s)). Studies/Results: US Renal  05/18/2014   CLINICAL DATA:  Worsening renal failure  EXAM: RENAL/URINARY TRACT ULTRASOUND COMPLETE  COMPARISON:   04/27/2014  FINDINGS: Right Kidney:  Length: 15 cm, larger than previous when it measured 14 cm. The hilar fat also appears more effaced. There is diffuse increased cortical echogenicity. No hydronephrosis or evidence of solid renal mass.  Left Kidney:  Not visualized.  No indication of nephrectomy.  Bladder:  Appears normal for degree of bladder distention.  IMPRESSION: Solitary right kidney with increasing size and echogenicity. These nonspecific findings can be seen with pyelonephritis or inflammatory nephritis. No hydronephrosis.   Electronically Signed   By: Jorje Guild M.D.   On: 05/18/2014 17:55   Micro Results: No results found for this or any previous visit (from the past 240 hour(s)). Studies/Results: US Renal  05/18/2014   CLINICAL DATA:  Worsening renal failure  EXAM: RENAL/URINARY TRACT ULTRASOUND COMPLETE  COMPARISON:  04/27/2014  FINDINGS: Right Kidney:  Length: 15 cm, larger than previous when it measured 14 cm. The hilar fat also appears more effaced. There is diffuse increased cortical echogenicity. No hydronephrosis or evidence of solid  renal mass.  Left Kidney:  Not visualized.  No indication of nephrectomy.  Bladder:  Appears normal for degree of bladder distention.  IMPRESSION: Solitary right kidney with increasing size and echogenicity. These nonspecific findings can be seen with pyelonephritis or inflammatory nephritis. No hydronephrosis.   Electronically Signed   By: Jorje Guild M.D.   On: 05/18/2014 17:55   Medications:  I have reviewed the patient's current medications Scheduled Meds: . allopurinol  300 mg Oral Daily  . atenolol  50 mg Oral Daily  . calcium acetate  1,334 mg Oral TID WC  . feeding supplement (ENSURE COMPLETE)  237 mL Oral BID BM  . simvastatin  40 mg Oral QPM  . zolpidem  10 mg Oral QHS   Continuous Infusions: . sodium chloride 150 mL/hr at 05/19/14 1844   PRN Meds:.acetaminophen, acetaminophen, alum & mag hydroxide-simeth, ondansetron (ZOFRAN)  IV, ondansetron, prochlorperazine, traMADol   Assessment/Plan: #1. Acute renal failure. Creatinine slightly improved at 9.3. INR remains at 2. Coumadin is being held. Plan kidney biopsy possibly in the near future. Discussed with nephrology. #2. Atrial fibrillation. Continue holding Coumadin. Continue atenolol. Active Problems:   ARF (acute renal failure)     LOS: 2 days   Dillon Strickland 05/20/2014, 7:41 AM

## 2014-05-21 LAB — IMMUNOFIXATION ELECTROPHORESIS
IgA: 159 mg/dL (ref 68–379)
IgG (Immunoglobin G), Serum: 826 mg/dL (ref 650–1600)
IgM, Serum: 32 mg/dL — ABNORMAL LOW (ref 41–251)
Total Protein ELP: 5.7 g/dL — ABNORMAL LOW (ref 6.0–8.3)

## 2014-05-21 LAB — BASIC METABOLIC PANEL
BUN: 86 mg/dL — AB (ref 6–23)
CALCIUM: 8.5 mg/dL (ref 8.4–10.5)
CO2: 21 meq/L (ref 19–32)
CREATININE: 9.33 mg/dL — AB (ref 0.50–1.35)
Chloride: 101 mEq/L (ref 96–112)
GFR calc Af Amer: 6 mL/min — ABNORMAL LOW (ref >90)
GFR calc non Af Amer: 5 mL/min — ABNORMAL LOW (ref >90)
GLUCOSE: 102 mg/dL — AB (ref 70–99)
Potassium: 4.1 mEq/L (ref 3.7–5.3)
Sodium: 140 mEq/L (ref 137–147)

## 2014-05-21 LAB — PROTEIN ELECTROPHORESIS, SERUM
Albumin ELP: 41.3 % — ABNORMAL LOW (ref 55.8–66.1)
Alpha-1-Globulin: 18.4 % — ABNORMAL HIGH (ref 2.9–4.9)
Alpha-2-Globulin: 17.6 % — ABNORMAL HIGH (ref 7.1–11.8)
Beta 2: 5.3 % (ref 3.2–6.5)
Beta Globulin: 5.7 % (ref 4.7–7.2)
Gamma Globulin: 11.7 % (ref 11.1–18.8)
M-Spike, %: NOT DETECTED g/dL
Total Protein ELP: 5.8 g/dL — ABNORMAL LOW (ref 6.0–8.3)

## 2014-05-21 LAB — COMPLEMENT, TOTAL: Compl, Total (CH50): 57 U/mL (ref 31–60)

## 2014-05-21 LAB — PHOSPHORUS: PHOSPHORUS: 6.2 mg/dL — AB (ref 2.3–4.6)

## 2014-05-21 LAB — ANTI-DNA ANTIBODY, DOUBLE-STRANDED: ds DNA Ab: 1 IU/mL

## 2014-05-21 LAB — PROTIME-INR
INR: 1.86 — ABNORMAL HIGH (ref 0.00–1.49)
Prothrombin Time: 20.9 seconds — ABNORMAL HIGH (ref 11.6–15.2)

## 2014-05-21 LAB — MPO/PR-3 (ANCA) ANTIBODIES
Myeloperoxidase Abs: >8 — ABNORMAL HIGH
Serine Protease 3: <1

## 2014-05-21 LAB — GLOMERULAR BASEMENT MEMBRANE ANTIBODIES: GBM Ab: <1

## 2014-05-21 MED ORDER — ALLOPURINOL 100 MG PO TABS
100.0000 mg | ORAL_TABLET | Freq: Every day | ORAL | Status: DC
  Administered 2014-05-21: 100 mg via ORAL
  Filled 2014-05-21: qty 1

## 2014-05-21 NOTE — Progress Notes (Signed)
MD called from [facility]) very angry that patient had not arrived.  MD gave me a room number and number for report and stated pt needed to be there hours ago due to needing a lot of procedures before the weekend.  MD stated bed had been available since 10am.  Nurse was called and report was given.  Pt placement called at 1420 to state that bed was available.  Per nurse and patient placement bed was not available until 1400.  Pt being transferred to room 808 at [facility].  Nurse was made aware of phone call that family received about the patient having a blood disorder and was very apologetic and stated she would investigate on her end.  Pt family is very upset about receiving this news even if it was an error. [facility] has been notified and in in route.

## 2014-05-21 NOTE — Progress Notes (Signed)
Pt daughter called very upset that someone from [place] called her and stated her father needed to be seen ASAP as he had a blood disorder that was metastasizing.  Pt family is very upset and the patient's daughter even called DR Ria Comment office.  Call must have been placed in error from [facility] but can not verify this because unable to reach anyone from [facility].  MD aware as he called to check status of pt transfer.

## 2014-05-21 NOTE — Discharge Summary (Signed)
Physician Discharge Summary  Dillon Strickland FUX:[id number] DOB: 11/20/1951 DOA: 05/18/2014   Admit date: 05/18/2014 Discharge date: 05/21/2014  Discharge Diagnoses: #1 acute renal failure  #2. Chronic atrial fibrillation. #3. Gout. #4. Hypokalemia #5. Hyperphosphatemia. Active Problems:   ARF (acute renal failure)    Wt Readings from Last 3 Encounters:  05/20/14 227 lb 12.8 oz (103.329 kg)  05/02/14 231 lb 11.3 oz (105.1 kg)  03/19/14 243 lb (110.224 kg)     Hospital Course:  This patient is a 63 year old male who was admitted for acute renal failure. He recently been hospitalized 2 weeks ago with acute renal failure but showed improvement in his creatinine from over 3 to approximately 2.6 after hydration. He was followed up as an outpatient one week later revealing a creatinine of 4 and one week after that revealing a creatinine of 9. He has a single kidney on the right. He has been evaluated by nephrology. It is felt that biopsy is needed but he is anticoagulated with Coumadin. Coumadin has been held. His INR is now 1.8. His workup has revealed a positive P ANCA. He has been treated with IV hydration and IV furosemide. Creatinine is only marginally improved to 9.3. His ultrasound was repeated revealing no hydronephrosis. His urinalysis has revealed red cells but no red cell casts. Hepatitis C antibody is negative. Hepatitis B surface antigen is negative. Hyperphosphatemia has been treated with PhosLo.  Discussion with nephrology at [facility] is pending. It appears he would benefit from transfer therefore further evaluation and treatment.   Discharge Instructions   Discharge medications include atenolol 50 mg daily Allopurinol 100 mg daily PhosLo 1334 mg 3 times a day Lasix 80 mg twice a day Simvastatin 40 mg daily Haley Roza 05/21/2014

## 2014-05-21 NOTE — Progress Notes (Signed)
Pt asked if he could walk to hallway and visit a patient that is a friend.  Friend is agreeable to the visit and pt ambulates without difficulty.

## 2014-05-21 NOTE — Progress Notes (Signed)
Subjective: Interval History: his appetite is not great and has some nausea but no vomiting. Patient denies any difficulty in breathing  Objective: Vital signs in last 24 hours: Temp:  [98.3 F (36.8 C)-98.5 F (36.9 C)] 98.5 F (36.9 C) (06/12 0457) Pulse Rate:  [103-113] 109 (06/12 0457) Resp:  [18-20] 20 (06/12 0457) BP: (117-134)/(73-83) 123/83 mmHg (06/12 0457) SpO2:  [97 %-100 %] 97 % (06/12 0457) Weight change:   Intake/Output from previous day: 06/11 0701 - 06/12 0700 In: 2260 [P.O.:720; I.V.:1540] Out: 4300 [Urine:4300] Intake/Output this shift:    General appearance: alert, cooperative and no distress Resp: clear to auscultation bilaterally Cardio: regular rate and rhythm, S1, S2 normal, no murmur, click, rub or gallop GI: soft, non-tender; bowel sounds normal; no masses,  no organomegaly Extremities: extremities normal, atraumatic, no cyanosis or edema  Lab Results:  Recent Labs  05/18/14 1019  WBC 8.5  HGB 10.4*  HCT 30.1*  PLT 401*   BMET:   Recent Labs  05/20/14 0637 05/21/14 0534  NA 139 140  K 3.8 4.1  CL 100 101  CO2 20 21  GLUCOSE 97 102*  BUN 81* 86*  CREATININE 9.37* 9.33*  CALCIUM 8.4 8.5   No results found for this basename: PTH,  in the last 72 hours Iron Studies: No results found for this basename: IRON, TIBC, TRANSFERRIN, FERRITIN,  in the last 72 hours  Studies/Results: No results found.  I have reviewed the patient's current medications.  Assessment/Plan:  problem #1 acute kidney injury: His BUN and creatinine is better. Patient blood work shows that he is positive for Myeloperoxidase and P-anca. His complement is normal. Hep B is negative and his hep C antibody is none reactive. At this moment the etiology for his AKI may be due to small blood vessel vaculitis Problem #2 a trial fibrillation his heart rate is controlled Problem #3 history of gout Problem #4 metabolic bone disease: Calcium is range and patient was in a  binder. Problem #5 hypercholesterolemia  Plan:decrease his ivf to 135 cc/hr  D/c lasix Patient will require kidney biopsy and possibly treatment for vasculitis including Plasmphresis.  Patient will benefit from being transferred to [facility]. I will discuss with patient and Dr Willey Blade.   LOS: 3 days   Cason Dabney S 05/21/2014,8:54 AM

## 2014-05-24 LAB — UIFE/LIGHT CHAINS/TP QN, 24-HR UR
Albumin, U: DETECTED
Alpha 1, Urine: DETECTED — AB
Alpha 2, Urine: DETECTED — AB
Beta, Urine: DETECTED — AB
Free Kappa Lt Chains,Ur: 10 mg/dL — ABNORMAL HIGH (ref 0.14–2.42)
Free Kappa/Lambda Ratio: 3.95 ratio (ref 2.04–10.37)
Free Lambda Excretion/Day: 67.05 mg/d
Free Lambda Lt Chains,Ur: 2.53 mg/dL — ABNORMAL HIGH (ref 0.02–0.67)
Free Lt Chn Excr Rate: 265 mg/d
Gamma Globulin, Urine: DETECTED — AB
Time: 24 hours
Total Protein, Urine-Ur/day: 1055 mg/d — ABNORMAL HIGH (ref 10–140)
Total Protein, Urine: 39.8 mg/dL
Volume, Urine: 2650 mL

## 2014-06-08 ENCOUNTER — Telehealth: Payer: Self-pay | Admitting: *Deleted

## 2014-06-08 NOTE — Telephone Encounter (Signed)
Pt wants to talk with lisa about coumadin, states he was in hospital for kidneys shutting down.

## 2014-06-08 NOTE — Telephone Encounter (Signed)
States he has been in [place] x 2 weeks for kidney failure due to antibody attacking his kidney.  Coumadin was stopped for a kidney biopsy and pt is to restart on 7/4 per [facility].  Told pt to take 5mg  x 2 days then resume 2.5mg  daily except 5mg  on Mondays and Thursdays.  Pt will have INR rechecked at [facility] on 7/9 with results to me.

## 2014-06-18 ENCOUNTER — Other Ambulatory Visit: Payer: Self-pay | Admitting: Cardiology

## 2014-06-19 LAB — PROTIME-INR
INR: 2.9 — ABNORMAL HIGH (ref <1.50)
Prothrombin Time: 30.3 seconds — ABNORMAL HIGH (ref 11.6–15.2)

## 2014-06-23 ENCOUNTER — Telehealth: Payer: Self-pay | Admitting: *Deleted

## 2014-06-23 ENCOUNTER — Ambulatory Visit (INDEPENDENT_AMBULATORY_CARE_PROVIDER_SITE_OTHER): Payer: PRIVATE HEALTH INSURANCE | Admitting: *Deleted

## 2014-06-23 DIAGNOSIS — I4891 Unspecified atrial fibrillation: Secondary | ICD-10-CM

## 2014-06-23 DIAGNOSIS — Z7901 Long term (current) use of anticoagulants: Secondary | ICD-10-CM

## 2014-06-23 DIAGNOSIS — I482 Chronic atrial fibrillation, unspecified: Secondary | ICD-10-CM

## 2014-06-23 NOTE — Telephone Encounter (Signed)
See coumadin note. 

## 2014-06-23 NOTE — Telephone Encounter (Signed)
PT / INR results / tgs

## 2014-07-29 ENCOUNTER — Other Ambulatory Visit: Payer: Self-pay | Admitting: Cardiology

## 2014-08-12 ENCOUNTER — Other Ambulatory Visit: Payer: Self-pay | Admitting: Cardiology

## 2014-08-13 LAB — PROTIME-INR
INR: 2.71 — AB (ref <1.50)
Prothrombin Time: 28.8 seconds — ABNORMAL HIGH (ref 11.6–15.2)

## 2014-08-25 ENCOUNTER — Emergency Department (HOSPITAL_COMMUNITY)
Admission: EM | Admit: 2014-08-25 | Discharge: 2014-08-25 | Disposition: A | Discharge status: Home / Self Care | Payer: Commercial Managed Care - PPO | Attending: Emergency Medicine | Admitting: Emergency Medicine

## 2014-08-25 ENCOUNTER — Encounter (HOSPITAL_COMMUNITY): Payer: Self-pay | Admitting: Emergency Medicine

## 2014-08-25 DIAGNOSIS — Y929 Unspecified place or not applicable: Secondary | ICD-10-CM | POA: Insufficient documentation

## 2014-08-25 DIAGNOSIS — Y9389 Activity, other specified: Secondary | ICD-10-CM | POA: Diagnosis not present

## 2014-08-25 DIAGNOSIS — E663 Overweight: Secondary | ICD-10-CM | POA: Diagnosis not present

## 2014-08-25 DIAGNOSIS — I4891 Unspecified atrial fibrillation: Secondary | ICD-10-CM

## 2014-08-25 DIAGNOSIS — Z87448 Personal history of other diseases of urinary system: Secondary | ICD-10-CM | POA: Diagnosis not present

## 2014-08-25 DIAGNOSIS — Z8739 Personal history of other diseases of the musculoskeletal system and connective tissue: Secondary | ICD-10-CM | POA: Insufficient documentation

## 2014-08-25 DIAGNOSIS — Z7901 Long term (current) use of anticoagulants: Secondary | ICD-10-CM | POA: Insufficient documentation

## 2014-08-25 DIAGNOSIS — S41109A Unspecified open wound of unspecified upper arm, initial encounter: Secondary | ICD-10-CM | POA: Diagnosis not present

## 2014-08-25 DIAGNOSIS — E785 Hyperlipidemia, unspecified: Secondary | ICD-10-CM | POA: Diagnosis not present

## 2014-08-25 DIAGNOSIS — S81809A Unspecified open wound, unspecified lower leg, initial encounter: Secondary | ICD-10-CM | POA: Insufficient documentation

## 2014-08-25 DIAGNOSIS — T148XXA Other injury of unspecified body region, initial encounter: Secondary | ICD-10-CM

## 2014-08-25 DIAGNOSIS — Z79899 Other long term (current) drug therapy: Secondary | ICD-10-CM | POA: Diagnosis not present

## 2014-08-25 DIAGNOSIS — IMO0002 Reserved for concepts with insufficient information to code with codable children: Secondary | ICD-10-CM | POA: Diagnosis not present

## 2014-08-25 LAB — PROTIME-INR
INR: 1.75 — ABNORMAL HIGH (ref 0.00–1.49)
PROTHROMBIN TIME: 20.4 s — AB (ref 11.6–15.2)

## 2014-08-25 LAB — CBC WITH DIFFERENTIAL/PLATELET
BASOS PCT: 0 % (ref 0–1)
Basophils Absolute: 0 10*3/uL (ref 0.0–0.1)
EOS PCT: 0 % (ref 0–5)
Eosinophils Absolute: 0 10*3/uL (ref 0.0–0.7)
HEMATOCRIT: 26.3 % — AB (ref 39.0–52.0)
HEMOGLOBIN: 8.4 g/dL — AB (ref 13.0–17.0)
Lymphocytes Relative: 4 % — ABNORMAL LOW (ref 12–46)
Lymphs Abs: 0.5 10*3/uL — ABNORMAL LOW (ref 0.7–4.0)
MCH: 31.3 pg (ref 26.0–34.0)
MCHC: 31.9 g/dL (ref 30.0–36.0)
MCV: 98.1 fL (ref 78.0–100.0)
Monocytes Absolute: 0.5 10*3/uL (ref 0.1–1.0)
Monocytes Relative: 4 % (ref 3–12)
NEUTROS ABS: 12.1 10*3/uL — AB (ref 1.7–7.7)
NEUTROS PCT: 92 % — AB (ref 43–77)
Platelets: 195 10*3/uL (ref 150–400)
RBC: 2.68 MIL/uL — AB (ref 4.22–5.81)
RDW: 17.9 % — ABNORMAL HIGH (ref 11.5–15.5)
WBC MORPHOLOGY: INCREASED
WBC: 13.1 10*3/uL — AB (ref 4.0–10.5)

## 2014-08-25 LAB — COMPREHENSIVE METABOLIC PANEL
ALBUMIN: 2.8 g/dL — AB (ref 3.5–5.2)
ALK PHOS: 42 U/L (ref 39–117)
ALT: 5 U/L (ref 0–53)
ANION GAP: 14 (ref 5–15)
AST: 18 U/L (ref 0–37)
BILIRUBIN TOTAL: 0.3 mg/dL (ref 0.3–1.2)
BUN: 49 mg/dL — ABNORMAL HIGH (ref 6–23)
CO2: 24 mEq/L (ref 19–32)
CREATININE: 1.95 mg/dL — AB (ref 0.50–1.35)
Calcium: 8.5 mg/dL (ref 8.4–10.5)
Chloride: 103 mEq/L (ref 96–112)
GFR calc Af Amer: 41 mL/min — ABNORMAL LOW (ref >90)
GFR calc non Af Amer: 35 mL/min — ABNORMAL LOW (ref >90)
GLUCOSE: 173 mg/dL — AB (ref 70–99)
Potassium: 3.7 mEq/L (ref 3.7–5.3)
Sodium: 141 mEq/L (ref 137–147)
TOTAL PROTEIN: 5.9 g/dL — AB (ref 6.0–8.3)

## 2014-08-25 LAB — APTT: APTT: 28 s (ref 24–37)

## 2014-08-25 LAB — TYPE AND SCREEN
ABO/RH(D): AB POS
ANTIBODY SCREEN: NEGATIVE

## 2014-08-25 MED ORDER — DILTIAZEM HCL 25 MG/5ML IV SOLN
10.0000 mg | Freq: Once | INTRAVENOUS | Status: AC
  Administered 2014-08-25: 10 mg via INTRAVENOUS
  Filled 2014-08-25: qty 5

## 2014-08-25 MED ORDER — DILTIAZEM HCL ER 240 MG PO CP24
240.0000 mg | ORAL_CAPSULE | Freq: Every day | ORAL | Status: DC
  Administered 2014-08-25: 240 mg via ORAL
  Filled 2014-08-25 (×2): qty 1

## 2014-08-25 MED ORDER — BACITRACIN ZINC 500 UNIT/GM EX OINT
TOPICAL_OINTMENT | CUTANEOUS | Status: AC
  Administered 2014-08-25: 1
  Filled 2014-08-25: qty 1.8

## 2014-08-25 MED ORDER — DILTIAZEM HCL ER COATED BEADS 240 MG PO CP24: 240.0000 mg | ORAL_CAPSULE | Freq: Every day | ORAL | Status: AC

## 2014-08-25 NOTE — ED Notes (Signed)
Abrasions and skin tears to bil arms x 5 days with active bleeding.  Reports stopped coumadin x 4 days ago and still experiencing bleeding.

## 2014-08-25 NOTE — Discharge Instructions (Signed)
I recommend you restart taking your Coumadin. We are also starting you on diltiazem to help control your atrial fibrillation rate. Please followup with [facility] as scheduled tomorrow. Your creatinine has improved her hemoglobin has dropped slightly. It is not low enough that I think you need a blood transfusion today. If you start having chest pain, shortness of breath, lightheadedness, feel like you're going to pass out or do pass out I recommend you come back to the hospital.   Atrial Fibrillation Atrial fibrillation is a type of irregular heart rhythm (arrhythmia). During atrial fibrillation, the upper chambers of the heart (atria) quiver continuously in a chaotic pattern. This causes an irregular and often rapid heart rate.  Atrial fibrillation is the result of the heart becoming overloaded with disorganized signals that tell it to beat. These signals are normally released one at a time by a part of the right atrium called the sinoatrial node. They then travel from the atria to the lower chambers of the heart (ventricles), causing the atria and ventricles to contract and pump blood as they pass. In atrial fibrillation, parts of the atria outside of the sinoatrial node also release these signals. This results in two problems. First, the atria receive so many signals that they do not have time to fully contract. Second, the ventricles, which can only receive one signal at a time, beat irregularly and out of rhythm with the atria.  There are three types of atrial fibrillation:   Paroxysmal. Paroxysmal atrial fibrillation starts suddenly and stops on its own within a week.  Persistent. Persistent atrial fibrillation lasts for more than a week. It may stop on its own or with treatment.  Permanent. Permanent atrial fibrillation does not go away. Episodes of atrial fibrillation may lead to permanent atrial fibrillation. Atrial fibrillation can prevent your heart from pumping blood normally. It increases  your risk of stroke and can lead to heart failure.  CAUSES   Heart conditions, including a heart attack, heart failure, coronary artery disease, and heart valve conditions.   Inflammation of the sac that surrounds the heart (pericarditis).  Blockage of an artery in the lungs (pulmonary embolism).  Pneumonia or other infections.  Chronic lung disease.  Thyroid problems, especially if the thyroid is overactive (hyperthyroidism).  Caffeine, excessive alcohol use, and use of some illegal drugs.   Use of some medicines, including certain decongestants and diet pills.  Heart surgery.   Birth defects.  Sometimes, no cause can be found. When this happens, the atrial fibrillation is called lone atrial fibrillation. The risk of complications from atrial fibrillation increases if you have lone atrial fibrillation and you are age 50 years or older. RISK FACTORS  Heart failure.  Coronary artery disease.  Diabetes mellitus.   High blood pressure (hypertension).   Obesity.   Other arrhythmias.   Increased age. SIGNS AND SYMPTOMS   A feeling that your heart is beating rapidly or irregularly.   A feeling of discomfort or pain in your chest.   Shortness of breath.   Sudden light-headedness or weakness.   Getting tired easily when exercising.   Urinating more often than normal (mainly when atrial fibrillation first begins).  In paroxysmal atrial fibrillation, symptoms may start and suddenly stop. DIAGNOSIS  Your health care provider may be able to detect atrial fibrillation when taking your pulse. Your health care provider may have you take a test called an ambulatory electrocardiogram (ECG). An ECG records your heartbeat patterns over a 24-hour period. You may also  have other tests, such as:  Transthoracic echocardiogram (TTE). During echocardiography, sound waves are used to evaluate how blood flows through your heart.  Transesophageal echocardiogram  (TEE).  Stress test. There is more than one type of stress test. If a stress test is needed, ask your health care provider about which type is best for you.  Chest X-ray exam.  Blood tests.  Computed tomography (CT). TREATMENT  Treatment may include:  Treating any underlying conditions. For example, if you have an overactive thyroid, treating the condition may correct atrial fibrillation.  Taking medicine. Medicines may be given to control a rapid heart rate or to prevent blood clots, heart failure, or a stroke.  Having a procedure to correct the rhythm of the heart:  Electrical cardioversion. During electrical cardioversion, a controlled, low-energy shock is delivered to the heart through your skin. If you have chest pain, very low blood pressure, or sudden heart failure, this procedure may need to be done as an emergency.  Catheter ablation. During this procedure, heart tissues that send the signals that cause atrial fibrillation are destroyed.  Surgical ablation. During this surgery, thin lines of heart tissue that carry the abnormal signals are destroyed. This procedure can either be an open-heart surgery or a minimally invasive surgery. With the minimally invasive surgery, small cuts are made to access the heart instead of a large opening.  Pulmonary venous isolation. During this surgery, tissue around the veins that carry blood from the lungs (pulmonary veins) is destroyed. This tissue is thought to carry the abnormal signals. HOME CARE INSTRUCTIONS   Take medicines only as directed by your health care provider. Some medicines can make atrial fibrillation worse or recur.  If blood thinners were prescribed by your health care provider, take them exactly as directed. Too much blood-thinning medicine can cause bleeding. If you take too little, you will not have the needed protection against stroke and other problems.  Perform blood tests at home if directed by your health care  provider. Perform blood tests exactly as directed.  Quit smoking if you smoke.  Do not drink alcohol.  Do not drink caffeinated beverages such as coffee, soda, and some teas. You may drink decaffeinated coffee, soda, or tea.   Maintain a healthy weight.Do not use diet pills unless your health care provider approves. They may make heart problems worse.   Follow diet instructions as directed by your health care provider.  Exercise regularly as directed by your health care provider.  Keep all follow-up visits as directed by your health care provider. This is important. PREVENTION  The following substances can cause atrial fibrillation to recur:   Caffeinated beverages.  Alcohol.  Certain medicines, especially those used for breathing problems.  Certain herbs and herbal medicines, such as those containing ephedra or ginseng.  Illegal drugs, such as cocaine and amphetamines. Sometimes medicines are given to prevent atrial fibrillation from recurring. Proper treatment of any underlying condition is also important in helping prevent recurrence.  SEEK MEDICAL CARE IF:  You notice a change in the rate, rhythm, or strength of your heartbeat.  You suddenly begin urinating more frequently.  You tire more easily when exerting yourself or exercising. SEEK IMMEDIATE MEDICAL CARE IF:   You have chest pain, abdominal pain, sweating, or weakness.  You feel nauseous.  You have shortness of breath.  You suddenly have swollen feet and ankles.  You feel dizzy.  Your face or limbs feel numb or weak.  You have a change in  your vision or speech. MAKE SURE YOU:   Understand these instructions.  Will watch your condition.  Will get help right away if you are not doing well or get worse. Document Released: 11/26/2005 Document Revised: 04/12/2014 Document Reviewed: 01/06/2013 [facility] Patient Information 2015 [place], [place]. This information is not intended to replace advice given to  you by your health care provider. Make sure you discuss any questions you have with your health care provider.

## 2014-08-25 NOTE — ED Notes (Signed)
PT obtained skin tears to bilateral arms at a sporting event on a metal bleacher x4 days ago. PT stated upon dressing changes at home bleeding has become uncontrollable. PT stopped his coumadin x4 days ago.
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
## 2014-09-02 ENCOUNTER — Other Ambulatory Visit: Payer: Self-pay | Admitting: Cardiology

## 2014-09-02 LAB — PROTIME-INR
INR: 2.26 — ABNORMAL HIGH (ref <1.50)
PROTHROMBIN TIME: 25 s — AB (ref 11.6–15.2)

## 2014-09-08 ENCOUNTER — Telehealth: Payer: Self-pay | Admitting: Cardiology

## 2014-09-08 NOTE — Telephone Encounter (Signed)
Will forward to Dr. Harl Bowie and Edrick Oh, RN. Pt notified that we will call him when results read.

## 2014-09-08 NOTE — Telephone Encounter (Signed)
Patient would like lab results from last week.  kf

## 2014-09-09 ENCOUNTER — Ambulatory Visit (INDEPENDENT_AMBULATORY_CARE_PROVIDER_SITE_OTHER): Payer: PRIVATE HEALTH INSURANCE | Admitting: *Deleted

## 2014-09-09 DIAGNOSIS — I482 Chronic atrial fibrillation, unspecified: Secondary | ICD-10-CM

## 2014-09-09 DIAGNOSIS — Z7901 Long term (current) use of anticoagulants: Secondary | ICD-10-CM

## 2014-09-09 NOTE — Telephone Encounter (Signed)
Dillon Strickland, can you address this. Only lab from last week was INR which I did not order, think it may be from coumadin clinic   Zandra Abts MD

## 2014-09-09 NOTE — Telephone Encounter (Signed)
Pt called and coumadin orders given.  See coumadin note.

## 2014-09-16 ENCOUNTER — Telehealth: Payer: Self-pay | Admitting: *Deleted

## 2014-09-16 NOTE — Telephone Encounter (Signed)
Received authorization of renewal of standing lab order from [facility] that will expire on 10/16/14. Will forward to coumadin nurse.

## 2014-10-02 ENCOUNTER — Other Ambulatory Visit: Payer: Self-pay

## 2014-10-02 MED ORDER — ATENOLOL 50 MG PO TABS: 50.0000 mg | ORAL_TABLET | Freq: Every day | ORAL | Status: DC

## 2014-11-28 ENCOUNTER — Encounter (HOSPITAL_COMMUNITY): Payer: Self-pay

## 2014-11-28 ENCOUNTER — Emergency Department (HOSPITAL_COMMUNITY)
Admission: EM | Admit: 2014-11-28 | Discharge: 2014-11-29 | Disposition: A | Discharge status: Home / Self Care | Payer: Commercial Managed Care - PPO | Attending: Emergency Medicine | Admitting: Emergency Medicine

## 2014-11-28 DIAGNOSIS — Z7952 Long term (current) use of systemic steroids: Secondary | ICD-10-CM | POA: Insufficient documentation

## 2014-11-28 DIAGNOSIS — N189 Chronic kidney disease, unspecified: Secondary | ICD-10-CM | POA: Insufficient documentation

## 2014-11-28 DIAGNOSIS — R112 Nausea with vomiting, unspecified: Secondary | ICD-10-CM | POA: Diagnosis not present

## 2014-11-28 DIAGNOSIS — Z792 Long term (current) use of antibiotics: Secondary | ICD-10-CM | POA: Diagnosis not present

## 2014-11-28 DIAGNOSIS — Z7901 Long term (current) use of anticoagulants: Secondary | ICD-10-CM | POA: Diagnosis not present

## 2014-11-28 DIAGNOSIS — Z79899 Other long term (current) drug therapy: Secondary | ICD-10-CM | POA: Insufficient documentation

## 2014-11-28 DIAGNOSIS — I776 Arteritis, unspecified: Secondary | ICD-10-CM | POA: Diagnosis not present

## 2014-11-28 DIAGNOSIS — M1180 Other specified crystal arthropathies, unspecified site: Secondary | ICD-10-CM | POA: Insufficient documentation

## 2014-11-28 DIAGNOSIS — R197 Diarrhea, unspecified: Secondary | ICD-10-CM | POA: Diagnosis not present

## 2014-11-28 DIAGNOSIS — E785 Hyperlipidemia, unspecified: Secondary | ICD-10-CM | POA: Diagnosis not present

## 2014-11-28 DIAGNOSIS — M479 Spondylosis, unspecified: Secondary | ICD-10-CM | POA: Insufficient documentation

## 2014-11-28 DIAGNOSIS — E663 Overweight: Secondary | ICD-10-CM | POA: Insufficient documentation

## 2014-11-28 DIAGNOSIS — I482 Chronic atrial fibrillation: Secondary | ICD-10-CM | POA: Insufficient documentation

## 2014-11-28 LAB — COMPREHENSIVE METABOLIC PANEL
ALBUMIN: 3.1 g/dL — AB (ref 3.5–5.2)
ALK PHOS: 39 U/L (ref 39–117)
ALT: 19 U/L (ref 0–53)
AST: 17 U/L (ref 0–37)
Anion gap: 15 (ref 5–15)
BUN: 41 mg/dL — ABNORMAL HIGH (ref 6–23)
CALCIUM: 9.2 mg/dL (ref 8.4–10.5)
CO2: 23 mEq/L (ref 19–32)
Chloride: 103 mEq/L (ref 96–112)
Creatinine, Ser: 2.03 mg/dL — ABNORMAL HIGH (ref 0.50–1.35)
GFR calc Af Amer: 38 mL/min — ABNORMAL LOW (ref >90)
GFR calc non Af Amer: 33 mL/min — ABNORMAL LOW (ref >90)
Glucose, Bld: 102 mg/dL — ABNORMAL HIGH (ref 70–99)
POTASSIUM: 3.9 meq/L (ref 3.7–5.3)
SODIUM: 141 meq/L (ref 137–147)
TOTAL PROTEIN: 5.7 g/dL — AB (ref 6.0–8.3)
Total Bilirubin: 0.6 mg/dL (ref 0.3–1.2)

## 2014-11-28 LAB — CBC WITH DIFFERENTIAL/PLATELET
BASOS ABS: 0 10*3/uL (ref 0.0–0.1)
Basophils Relative: 0 % (ref 0–1)
EOS ABS: 0 10*3/uL (ref 0.0–0.7)
EOS PCT: 0 % (ref 0–5)
HCT: 34.4 % — ABNORMAL LOW (ref 39.0–52.0)
Hemoglobin: 11.4 g/dL — ABNORMAL LOW (ref 13.0–17.0)
Lymphocytes Relative: 4 % — ABNORMAL LOW (ref 12–46)
Lymphs Abs: 0.3 10*3/uL — ABNORMAL LOW (ref 0.7–4.0)
MCH: 31.7 pg (ref 26.0–34.0)
MCHC: 33.1 g/dL (ref 30.0–36.0)
MCV: 95.6 fL (ref 78.0–100.0)
Monocytes Absolute: 0.3 10*3/uL (ref 0.1–1.0)
Monocytes Relative: 4 % (ref 3–12)
Neutro Abs: 6.6 10*3/uL (ref 1.7–7.7)
Neutrophils Relative %: 92 % — ABNORMAL HIGH (ref 43–77)
Platelets: 207 10*3/uL (ref 150–400)
RBC: 3.6 MIL/uL — AB (ref 4.22–5.81)
RDW: 14.3 % (ref 11.5–15.5)
WBC: 7.2 10*3/uL (ref 4.0–10.5)

## 2014-11-28 LAB — LIPASE, BLOOD: Lipase: 25 U/L (ref 11–59)

## 2014-11-28 LAB — PROTIME-INR
INR: 3.13 — ABNORMAL HIGH (ref 0.00–1.49)
Prothrombin Time: 32.4 seconds — ABNORMAL HIGH (ref 11.6–15.2)

## 2014-11-28 MED ORDER — LOPERAMIDE HCL 2 MG PO CAPS
4.0000 mg | ORAL_CAPSULE | Freq: Once | ORAL | Status: AC
  Administered 2014-11-28: 4 mg via ORAL
  Filled 2014-11-28: qty 2

## 2014-11-28 MED ORDER — SODIUM CHLORIDE 0.9 % IV BOLUS (SEPSIS)
1000.0000 mL | Freq: Once | INTRAVENOUS | Status: AC
  Administered 2014-11-28: 1000 mL via INTRAVENOUS

## 2014-11-28 MED ORDER — ONDANSETRON HCL 4 MG/2ML IJ SOLN
4.0000 mg | Freq: Once | INTRAMUSCULAR | Status: AC
  Administered 2014-11-28: 4 mg via INTRAVENOUS
  Filled 2014-11-28: qty 2

## 2014-11-28 NOTE — ED Notes (Signed)
Patient states nausea vomiting and diarrhea that started at 1500 today. Patient states emesis X8 diarrhea X12. Patient A&OX4. 20g iv in left ac. 400cc NSS and 4mg  zofran given en route via [facility].

## 2014-11-28 NOTE — ED Provider Notes (Signed)
This chart was scribed for Gaffney, DO by Peyton Bottoms, ED Scribe. This patient was seen in room APA19/APA19 and the patient's care was started at 10:56 PM.  TIME SEEN: 10:59 PM   CHIEF COMPLAINT: Emesis  HPI: Patient is a 63 y.o. 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 Patient states he has associated multiple episodes of diarrhea.  He denies associated chest pain, SOB, melena, hematochezia, dysuria, hematuria.  Has had abdominal soreness from vomiting.  No prior h/o abdominal surgeries.  Thinks he may have had sick contacts 2 days ago.  No travel.    ROS: See HPI Constitutional: no fever  Eyes: no drainage  ENT: no runny nose   Cardiovascular:  no chest pain  Resp: no SOB  GI: Nausea, Vomiting, Diarrhea GU: no dysuria Integumentary: no rash  Allergy: no hives  Musculoskeletal: no leg swelling  Neurological: no slurred speech ROS otherwise negative  PAST MEDICAL HISTORY/PAST SURGICAL HISTORY:  Past Medical History  Diagnosis Date  . Chronic atrial fibrillation 2001    Spontaneous contrast on TEE  . Chronic anticoagulation     Colonoscopy with polypectomy in 2006; due for a repeat procedure as of 2012  . Hyperlipidemia   . Pseudogout   . Overweight(278.02)   . Degenerative joint disease of spine     Mild; cervical and lumbosacral    MEDICATIONS:  Prior to Admission medications   Medication Sig Start Date End Date Taking? Authorizing Provider  atenolol (TENORMIN) 50 MG tablet Take 1 tablet (50 mg total) by mouth daily. 10/02/14   Arnoldo Lenis, MD  calcium carbonate (OS-CAL - DOSED IN MG OF ELEMENTAL CALCIUM) 1250 MG tablet Take 1 tablet by mouth daily with breakfast.    Historical Provider, MD  cholecalciferol (VITAMIN D) 1000 UNITS tablet Take 1,000 Units by mouth daily.     Historical Provider, MD  diltiazem (CARTIA XT) 240 MG 24 hr capsule Take 1 capsule (240 mg total) by mouth daily. 08/25/14   Natina Wiginton N Elsie Sakuma, DO  predniSONE (DELTASONE) 10 MG tablet Take 10 mg by mouth 3 (three) times daily.    Historical Provider, MD  simvastatin (ZOCOR) 40 MG tablet Take 40 mg by mouth daily. 04/21/14   Arnoldo Lenis, MD  sulfamethoxazole-trimethoprim (BACTRIM DS) 800-160 MG per tablet Take 1 tablet by mouth 3 (three) times a week. Monday, Wednesday and Thursday    Historical Provider, MD  warfarin (COUMADIN) 5 MG tablet Take 2.5-5 mg by mouth See admin instructions. Takes 2.5 mg five days a week and 5 mg 2 days a week.  No set days (rotates 4 halves, 1 whole and 3 halves, 1 hole)    Historical Provider, MD    ALLERGIES:  No Known Allergies  SOCIAL HISTORY:  History  Substance Use Topics  . Smoking status: Never Smoker   . Smokeless tobacco: Not on file  . Alcohol Use: No     Comment: occasional beer, glass of wine/month - denies at this time 05/18/14    FAMILY HISTORY: Family History  Problem Relation Age of Onset  . Stroke Mother   . Cancer Mother   . Heart attack Father   . Arrhythmia Sister     Atrial fibrillation  . Colon cancer Neg Hx   . Arrhythmia Brother   . Arrhythmia Brother   . Diabetes Other   .  Heart attack Other     EXAM:  Triage Vitals: BP 139/89 mmHg  Pulse 118  Temp(Src) 98.4 F (36.9 C) (Oral)  Resp 20  Ht 5\' 8"  (1.727 m)  Wt 222 lb (100.699 kg)  BMI 33.76 kg/m2  SpO2 100%  CONSTITUTIONAL: Alert and oriented and responds appropriately to questions. Well-appearing; well-nourished HEAD: Normocephalic EYES: Conjunctivae clear, PERRL ENT: normal nose; no rhinorrhea; moist mucous membranes; pharynx without lesions noted NECK: Supple, no meningismus, no LAD  CARD: irregularly irregular and tachycardic; S1 and S2 appreciated; no murmurs, no clicks, no rubs, no gallops RESP: Normal chest excursion without splinting or tachypnea; breath  sounds clear and equal bilaterally; no wheezes, no rhonchi, no rales, no hypoxia ABD/GI: Normal bowel sounds; non-distended; soft, non-tender, no rebound, no guarding BACK:  The back appears normal and is non-tender to palpation, there is no CVA tenderness EXT: Normal ROM in all joints; non-tender to palpation; no edema; normal capillary refill; no cyanosis    SKIN: Normal color for age and race; warm NEURO: Moves all extremities equally PSYCH: The patient's mood and manner are appropriate. Grooming and personal hygiene are appropriate.  MEDICAL DECISION MAKING: Pt here with N/V/D and benign abdominal exam.  Suspect viral illness.  Will check labs, urine.  Do not feel with benign abd exam that he needs abs imaging.  Will treat symptomatically with Zofran, IVF, Imodium.  ED PROGRESS: Pt reports feeling better.  Tolerating po.  Mild intermittent tachycardia, has chronic a fib.  INR therapeutic.  Urine pending.  Abd exam still benign.  Awaiting urine.  Signed out to Dr. Roxanne Mins to follow up on urine but anticipate dc home.    EKG Interpretation  Date/Time:  Sunday November 28 2014 23:17:33 EST Ventricular Rate:  120 PR Interval:    QRS Duration: 83 QT Interval:  319 QTC Calculation: 451 R Axis:   57 Text Interpretation:  Atrial fibrillation Ventricular premature complex Borderline T abnormalities, inferior leads Baseline wander in lead(s) II III aVF No significant change since last tracing Confirmed by Claudis Giovanelli,  DO, Hilja Kintzel ([id number]) on 11/28/2014 11:28:00 PM       I personally performed the services described in this documentation, which was scribed in my presence. The recorded information has been reviewed and is accurate.  [place], DO 11/29/14 2231

## 2014-11-29 LAB — URINALYSIS, ROUTINE W REFLEX MICROSCOPIC
Bilirubin Urine: NEGATIVE
GLUCOSE, UA: NEGATIVE mg/dL
KETONES UR: NEGATIVE mg/dL
LEUKOCYTES UA: NEGATIVE
Nitrite: NEGATIVE
PH: 6.5 (ref 5.0–8.0)
Protein, ur: 100 mg/dL — AB
Specific Gravity, Urine: 1.01 (ref 1.005–1.030)
Urobilinogen, UA: 0.2 mg/dL (ref 0.0–1.0)

## 2014-11-29 LAB — URINE MICROSCOPIC-ADD ON

## 2014-11-29 MED ORDER — DILTIAZEM HCL 25 MG/5ML IV SOLN
10.0000 mg | Freq: Once | INTRAVENOUS | Status: AC
  Administered 2014-11-29: 10 mg via INTRAVENOUS
  Filled 2014-11-29: qty 5

## 2014-11-29 MED ORDER — ONDANSETRON HCL 4 MG PO TABS: 4.0000 mg | ORAL_TABLET | Freq: Four times a day (QID) | ORAL | Status: DC | PRN

## 2014-11-29 MED ORDER — SODIUM CHLORIDE 0.9 % IV SOLN: INTRAVENOUS | Status: DC

## 2014-11-29 MED ORDER — ONDANSETRON 4 MG PO TBDP: 4.0000 mg | ORAL_TABLET | Freq: Three times a day (TID) | ORAL | Status: DC | PRN

## 2014-11-29 NOTE — ED Provider Notes (Signed)
Patient initially seen and evaluated by Dr. Leonides Schanz, presented for vomiting and diarrhea. He is feeling much better after IV hydration, ondansetron, and loperamide. He was signed out to me awaiting results of urinalysis which show microscopic hematuria which has been present in the past and is unchanged. Prescription had been left for ondansetron ODT. He is also given a take-home pack of ondansetron tablets.  Delora Fuel, MD 93/90/30 0923

## 2014-11-29 NOTE — Discharge Instructions (Signed)

## 2015-01-12 ENCOUNTER — Other Ambulatory Visit: Payer: Self-pay | Admitting: Adult Health

## 2015-02-11 ENCOUNTER — Telehealth: Payer: Self-pay | Admitting: Cardiology

## 2015-02-11 DIAGNOSIS — I480 Paroxysmal atrial fibrillation: Secondary | ICD-10-CM

## 2015-02-11 DIAGNOSIS — Z5181 Encounter for therapeutic drug level monitoring: Secondary | ICD-10-CM

## 2015-02-11 NOTE — Telephone Encounter (Signed)
States that he needs order faxed to [facility] for PT/INR / tgs

## 2015-03-08 ENCOUNTER — Telehealth: Payer: Self-pay | Admitting: Cardiology

## 2015-03-08 NOTE — Telephone Encounter (Signed)
Wanting to know what he needs to do since his last visit

## 2015-03-08 NOTE — Telephone Encounter (Signed)
Pt thought he had INR drawn on 3/18 with labs from Dr Lowanda Foster but after checking with Randell Loop it has not be done.  Pt to go to lab in next couple of days to check INR drawn.  He said he would go Thursday or Friday.

## 2015-06-06 ENCOUNTER — Telehealth: Payer: Self-pay | Admitting: *Deleted

## 2015-06-06 NOTE — Telephone Encounter (Signed)
Pt called wanted [place] to fax order to [facility] to have his INR done there .This nurse spoke with the the pt and he has not had INR done since October 2015 . Stressed to pt needs to come to [employer] office and have INR checked and he states he can not afford co pay and his insurance will pay for INR to be done at a lab Order written and pt instructed to come by office to pick up order to take to [facility] for INR to be done tomorrow and he states he will come by and get the order and have INRdone

## 2015-06-06 NOTE — Telephone Encounter (Signed)
Patient wants order sent to [facility] to do his INR. Phone number is [phone]. / tg

## 2015-06-07 ENCOUNTER — Other Ambulatory Visit: Payer: Self-pay | Admitting: Cardiology

## 2015-06-08 ENCOUNTER — Ambulatory Visit (INDEPENDENT_AMBULATORY_CARE_PROVIDER_SITE_OTHER): Payer: PRIVATE HEALTH INSURANCE | Admitting: *Deleted

## 2015-06-08 DIAGNOSIS — I482 Chronic atrial fibrillation, unspecified: Secondary | ICD-10-CM

## 2015-06-08 DIAGNOSIS — Z7901 Long term (current) use of anticoagulants: Secondary | ICD-10-CM

## 2015-06-08 LAB — PROTIME-INR
INR: 2 — AB (ref 0.8–1.2)
PROTHROMBIN TIME: 21.4 s — AB (ref 9.1–12.0)

## 2015-08-19 ENCOUNTER — Other Ambulatory Visit: Payer: Self-pay | Admitting: Cardiology

## 2015-08-24 ENCOUNTER — Other Ambulatory Visit: Payer: Self-pay | Admitting: Cardiology

## 2015-09-20 ENCOUNTER — Other Ambulatory Visit: Payer: Self-pay | Admitting: Cardiology

## 2015-09-23 ENCOUNTER — Other Ambulatory Visit: Payer: Self-pay | Admitting: Cardiology

## 2015-10-03 ENCOUNTER — Other Ambulatory Visit: Payer: Self-pay | Admitting: Cardiology

## 2015-10-22 ENCOUNTER — Other Ambulatory Visit: Payer: Self-pay | Admitting: Cardiology

## 2015-10-25 NOTE — Telephone Encounter (Signed)
Pt has not been seen since 03/19/2014. Pt requesting a refill. Please advise

## 2015-10-31 ENCOUNTER — Telehealth: Payer: Self-pay | Admitting: Cardiology

## 2015-10-31 MED ORDER — SIMVASTATIN 40 MG PO TABS: ORAL_TABLET | ORAL | Status: DC

## 2015-10-31 MED ORDER — ATENOLOL 50 MG PO TABS: 50.0000 mg | ORAL_TABLET | Freq: Every day | ORAL | Status: DC

## 2015-10-31 NOTE — Telephone Encounter (Signed)
The patient had questions about two medications:  Atenolol - the patient said he received a letter stating he needs to make an appointment to continue getting his refills. He scheduled an appointment with Dr. Harl Bowie next Friday, 11/11/15 but only has enough medication to get him through this Friday, 11/04/15. Per our [employer], I told him we will call him in a 15 day supply to get him to his appointment next week?  Coumadin - the patient said he has been going to [facility] for Kidney labs and he thought they were checking his PT/INR. He wants to make sure they are reporting it to [place]. He has orders to get more labs done this and wants to be sure that he can get his PT/INR done at the same time. He stated he may even go tomorrow for labs. He was wondering if we needed to send them a letter or orders so that it could all be done at once.  Please let the patient know when his atenolol is called in and confirm whether or not we are getting what we need from [facility].

## 2015-10-31 NOTE — Telephone Encounter (Signed)
Refill complete 

## 2015-11-11 ENCOUNTER — Encounter: Payer: Self-pay | Admitting: Cardiology

## 2015-11-11 ENCOUNTER — Ambulatory Visit (INDEPENDENT_AMBULATORY_CARE_PROVIDER_SITE_OTHER): Payer: PRIVATE HEALTH INSURANCE | Admitting: Cardiology

## 2015-11-11 VITALS — BP 110/70 | HR 84 | Ht 65.0 in | Wt 194.0 lb

## 2015-11-11 DIAGNOSIS — E785 Hyperlipidemia, unspecified: Secondary | ICD-10-CM

## 2015-11-11 DIAGNOSIS — I4891 Unspecified atrial fibrillation: Secondary | ICD-10-CM | POA: Diagnosis not present

## 2015-11-11 MED ORDER — SIMVASTATIN 40 MG PO TABS: ORAL_TABLET | ORAL | Status: DC

## 2015-11-11 MED ORDER — ATENOLOL 50 MG PO TABS: 50.0000 mg | ORAL_TABLET | Freq: Every day | ORAL | Status: DC

## 2015-11-11 NOTE — Patient Instructions (Signed)
Your physician wants you to follow-up in: 1 year with Dr Branch You will receive a reminder letter in the mail two months in advance. If you don't receive a letter, please call our office to schedule the follow-up appointment.     Your physician recommends that you continue on your current medications as directed. Please refer to the Current Medication list given to you today.     If you need a refill on your cardiac medications before your next appointment, please call your pharmacy.     Thank you for choosing [facility] !        

## 2015-11-11 NOTE — Progress Notes (Signed)
Patient ID: Dillon Strickland, male   DOB: 1951/09/28, 64 y.o.   MRN: [id number]     Clinical Summary Dillon Strickland is a 64 y.o.male seen today for follow up of the following medical problems.   1. Chronic afib - denies any palpitations. Compliant with meds, including coumadin. He is followed her in our coumadin clinic.  - a trial of eliquis caused some fatigue per his report, he is happy with coumadin.    2. Hyperlipidemia - compliant with statin - no recent panel in our system  3. Vasculitis - followed at [facility] Past Medical History  Diagnosis Date  . Chronic atrial fibrillation 2001    Spontaneous contrast on TEE  . Chronic anticoagulation     Colonoscopy with polypectomy in 2006; due for a repeat procedure as of 2012  . Hyperlipidemia   . Pseudogout   . Overweight(278.02)   . Degenerative joint disease of spine     Mild; cervical and lumbosacral     No Known Allergies   Current Outpatient Prescriptions  Medication Sig Dispense Refill  . atenolol (TENORMIN) 50 MG tablet Take 1 tablet (50 mg total) by mouth daily. 15 tablet 0  . calcium carbonate (OS-CAL - DOSED IN MG OF ELEMENTAL CALCIUM) 1250 MG tablet Take 1 tablet by mouth daily with breakfast.    . cholecalciferol (VITAMIN D) 1000 UNITS tablet Take 1,000 Units by mouth daily.    Marland Kitchen diltiazem (CARTIA XT) 240 MG 24 hr capsule Take 1 capsule (240 mg total) by mouth daily. 30 capsule 1  . ondansetron (ZOFRAN ODT) 4 MG disintegrating tablet Take 1 tablet (4 mg total) by mouth every 8 (eight) hours as needed for nausea or vomiting. 20 tablet 0  . ondansetron (ZOFRAN) 4 MG tablet Take 1 tablet (4 mg total) by mouth every 6 (six) hours as needed for nausea or vomiting. 12 tablet 0  . predniSONE (DELTASONE) 10 MG tablet Take 10 mg by mouth 3 (three) times daily.    . simvastatin (ZOCOR) 40 MG tablet TAKE ONE TABLET DAILY AT 6PM FOR CHOLESTEROL. 15 tablet 0  . sulfamethoxazole-trimethoprim (BACTRIM DS) 800-160 MG per  tablet Take 1 tablet by mouth 3 (three) times a week. Monday, Wednesday and Thursday    . warfarin (COUMADIN) 5 MG tablet Take 2.5-5 mg by mouth See admin instructions. Takes 2.5 mg five days a week and 5 mg 2 days a week.  No set days (rotates 4 halves, 1 whole and 3 halves, 1 hole)     No current facility-administered medications for this visit.     Past Surgical History  Procedure Laterality Date  . Colonoscopy w/ polypectomy  2006  . Colonoscopy N/A 04/23/2013    Procedure: COLONOSCOPY;  Surgeon: Rogene Houston, MD;  Location: [facility];  Service: Endoscopy;  Laterality: N/A;  1030-rescheduled to 29 Ann notified pt     No Known Allergies    Family History  Problem Relation Age of Onset  . Stroke Mother   . Cancer Mother   . Heart attack Father   . Arrhythmia Sister     Atrial fibrillation  . Colon cancer Neg Hx   . Arrhythmia Brother   . Arrhythmia Brother   . Diabetes Other   . Heart attack Other      Social History Dillon Strickland reports that he has never smoked. He does not have any smokeless tobacco history on file. Dillon Strickland reports that he does not drink alcohol.  Review of Systems CONSTITUTIONAL: No weight loss, fever, chills, weakness or fatigue.  HEENT: Eyes: No visual loss, blurred vision, double vision or yellow sclerae.No hearing loss, sneezing, congestion, runny nose or sore throat.  SKIN: No rash or itching.  CARDIOVASCULAR: per hpi RESPIRATORY: No shortness of breath, cough or sputum.  GASTROINTESTINAL: No anorexia, nausea, vomiting or diarrhea. No abdominal pain or blood.  GENITOURINARY: No burning on urination, no polyuria NEUROLOGICAL: No headache, dizziness, syncope, paralysis, ataxia, numbness or tingling in the extremities. No change in bowel or bladder control.  MUSCULOSKELETAL: No muscle, back pain, joint pain or stiffness.  LYMPHATICS: No enlarged nodes. No history of splenectomy.  PSYCHIATRIC: No history of depression or anxiety.    ENDOCRINOLOGIC: No reports of sweating, cold or heat intolerance. No polyuria or polydipsia.  Marland Kitchen   Physical Examination Filed Vitals:   11/11/15 1018  BP: 110/70  Pulse: 84   Filed Vitals:   11/11/15 1018  Height: 5\' 5"  (1.651 m)  Weight: 194 lb (87.998 kg)    Gen: resting comfortably, no acute distress HEENT: no scleral icterus, pupils equal round and reactive, no palptable cervical adenopathy,  CV: RRR, no m/r/g, no jvd Resp: Clear to auscultation bilaterally GI: abdomen is soft, non-tender, non-distended, normal bowel sounds, no hepatosplenomegaly MSK: extremities are warm, no edema.  Skin: warm, no rash Neuro:  no focal deficits Psych: appropriate affect   Diagnostic Studies 07/2007 TEE + echo contrast, appendage with low velocities, normal LV function  03/19/14 EKG Afib rate 91    Assessment and Plan   1. Afib - no current symptoms - continue current meds  2. Hyperlipidemia - request pcp labs - continue current staitn  F/u 1 year    Arnoldo Lenis, M.D.

## 2015-11-14 ENCOUNTER — Other Ambulatory Visit: Payer: Self-pay | Admitting: Cardiology

## 2016-01-19 ENCOUNTER — Telehealth: Payer: Self-pay | Admitting: *Deleted

## 2016-01-19 DIAGNOSIS — I4891 Unspecified atrial fibrillation: Secondary | ICD-10-CM

## 2016-01-19 DIAGNOSIS — Z5181 Encounter for therapeutic drug level monitoring: Secondary | ICD-10-CM

## 2016-01-19 NOTE — Telephone Encounter (Signed)
Patient states that he has been having his INR's checked @ [facility]. He states that he has had hit drawn as recent as 2 wks ago. Advised that we have not received any of those results. Please call patient to advise on how to continue with INR and Coumadin management. / tg

## 2016-01-19 NOTE — Telephone Encounter (Signed)
01/19/16  Called pt.  No answer or voice mail.

## 2016-01-24 NOTE — Telephone Encounter (Signed)
Pt returned call.  States he has been getting labs done at [facility] for kidney doctor 2 x month and thought they were checking his INR.  He understands now that this has not been done.  Will go to [facility] and get INR checked with results to Dr Harl Bowie.

## 2016-01-25 ENCOUNTER — Other Ambulatory Visit: Payer: Self-pay | Admitting: Cardiology

## 2016-01-26 LAB — PROTIME-INR
INR: 1.9 — AB (ref 0.8–1.2)
PROTHROMBIN TIME: 19.4 s — AB (ref 9.1–12.0)

## 2016-01-31 ENCOUNTER — Telehealth: Payer: Self-pay | Admitting: *Deleted

## 2016-01-31 ENCOUNTER — Ambulatory Visit (INDEPENDENT_AMBULATORY_CARE_PROVIDER_SITE_OTHER): Payer: PRIVATE HEALTH INSURANCE | Admitting: *Deleted

## 2016-01-31 DIAGNOSIS — Z7901 Long term (current) use of anticoagulants: Secondary | ICD-10-CM

## 2016-01-31 DIAGNOSIS — I482 Chronic atrial fibrillation, unspecified: Secondary | ICD-10-CM

## 2016-01-31 NOTE — Telephone Encounter (Signed)
See coumadin note. 

## 2016-01-31 NOTE — Telephone Encounter (Signed)
Patient left a message on Friday, 01/27/16 stating that he had blood work done and wanted to touch base to see if he needed to change his coumadin doses.

## 2016-03-16 ENCOUNTER — Other Ambulatory Visit: Payer: Self-pay | Admitting: Cardiology

## 2016-10-26 DIAGNOSIS — Z79899 Other long term (current) drug therapy: Secondary | ICD-10-CM | POA: Diagnosis not present

## 2016-10-26 DIAGNOSIS — M1A9XX0 Chronic gout, unspecified, without tophus (tophi): Secondary | ICD-10-CM | POA: Diagnosis not present

## 2016-10-26 DIAGNOSIS — M301 Polyarteritis with lung involvement [Churg-Strauss]: Secondary | ICD-10-CM | POA: Diagnosis not present

## 2016-10-26 DIAGNOSIS — I776 Arteritis, unspecified: Secondary | ICD-10-CM | POA: Diagnosis not present

## 2016-10-26 DIAGNOSIS — R5383 Other fatigue: Secondary | ICD-10-CM | POA: Diagnosis not present

## 2016-11-12 ENCOUNTER — Other Ambulatory Visit: Payer: Self-pay | Admitting: Cardiology

## 2016-11-23 DIAGNOSIS — N183 Chronic kidney disease, stage 3 (moderate): Secondary | ICD-10-CM | POA: Diagnosis not present

## 2016-11-23 DIAGNOSIS — F3340 Major depressive disorder, recurrent, in remission, unspecified: Secondary | ICD-10-CM | POA: Diagnosis not present

## 2016-11-26 ENCOUNTER — Other Ambulatory Visit: Payer: Self-pay | Admitting: *Deleted

## 2016-12-05 DIAGNOSIS — I775 Necrosis of artery: Secondary | ICD-10-CM | POA: Diagnosis not present

## 2016-12-05 DIAGNOSIS — I776 Arteritis, unspecified: Secondary | ICD-10-CM | POA: Diagnosis not present

## 2016-12-05 DIAGNOSIS — Z79899 Other long term (current) drug therapy: Secondary | ICD-10-CM | POA: Diagnosis not present

## 2016-12-11 DIAGNOSIS — H5213 Myopia, bilateral: Secondary | ICD-10-CM | POA: Diagnosis not present

## 2016-12-11 DIAGNOSIS — H2513 Age-related nuclear cataract, bilateral: Secondary | ICD-10-CM | POA: Diagnosis not present

## 2016-12-11 DIAGNOSIS — H524 Presbyopia: Secondary | ICD-10-CM | POA: Diagnosis not present

## 2016-12-13 ENCOUNTER — Other Ambulatory Visit: Payer: Self-pay | Admitting: Cardiology

## 2016-12-20 DIAGNOSIS — Z6834 Body mass index (BMI) 34.0-34.9, adult: Secondary | ICD-10-CM | POA: Diagnosis not present

## 2016-12-20 DIAGNOSIS — R05 Cough: Secondary | ICD-10-CM | POA: Diagnosis not present

## 2017-01-03 ENCOUNTER — Encounter: Payer: Self-pay | Admitting: Cardiology

## 2017-01-03 ENCOUNTER — Ambulatory Visit (INDEPENDENT_AMBULATORY_CARE_PROVIDER_SITE_OTHER): Payer: PPO | Admitting: Cardiology

## 2017-01-03 VITALS — BP 104/64 | HR 61 | Ht 66.0 in | Wt 206.0 lb

## 2017-01-03 DIAGNOSIS — I4891 Unspecified atrial fibrillation: Secondary | ICD-10-CM | POA: Diagnosis not present

## 2017-01-03 DIAGNOSIS — E782 Mixed hyperlipidemia: Secondary | ICD-10-CM | POA: Diagnosis not present

## 2017-01-03 NOTE — Patient Instructions (Signed)

## 2017-01-03 NOTE — Progress Notes (Signed)
Clinical Summary Mr. Meints is a 66 y.o.male seen today for follow up of the following medical problems.   1. Chronic afib - a trial of eliquis caused some fatigue per his report, he is happy with coumadin.   - no recent palpitations - no bleeding troubles on coumadin. Followed by coumadin clinic.    2. Hyperlipidemia - compliant with statin - no recent lipid panel in our system  3. Vasculitis - followed at [facility]   Past Medical History:  Diagnosis Date  . Chronic anticoagulation    Colonoscopy with polypectomy in 2006; due for a repeat procedure as of 2012  . Chronic atrial fibrillation ([place]) 2001   Spontaneous contrast on TEE  . Degenerative joint disease of spine    Mild; cervical and lumbosacral  . Hyperlipidemia   . Overweight(278.02)   . Pseudogout      No Known Allergies   Current Outpatient Prescriptions  Medication Sig Dispense Refill  . atenolol (TENORMIN) 50 MG tablet TAKE ONE TABLET BY MOUTH DAILY. 30 tablet 3  . azaTHIOprine (IMURAN) 50 MG tablet Take 1/2 tab by mouth (25 mg) daily    . COUMADIN 5 MG tablet TAKE 1 TABLET BY MOUTH DAILY OR AS DIRECTED. 60 tablet 2  . diltiazem (CARTIA XT) 240 MG 24 hr capsule Take 1 capsule (240 mg total) by mouth daily. 30 capsule 1  . escitalopram (LEXAPRO) 20 MG tablet Take 20 mg by mouth daily.    . simvastatin (ZOCOR) 40 MG tablet TAKE ONE TABLET DAILY AT 6PM FOR CHOLESTEROL. 30 tablet 3   No current facility-administered medications for this visit.      Past Surgical History:  Procedure Laterality Date  . COLONOSCOPY N/A 04/23/2013   Procedure: COLONOSCOPY;  Surgeon: Rogene Houston, MD;  Location: [facility];  Service: Endoscopy;  Laterality: N/A;  1030-rescheduled to [place] notified pt  . COLONOSCOPY W/ POLYPECTOMY  2006     No Known Allergies    Family History  Problem Relation Age of Onset  . Stroke Mother   . Cancer Mother   . Heart attack Father   . Arrhythmia Sister    Atrial fibrillation  . Colon cancer Neg Hx   . Arrhythmia Brother   . Arrhythmia Brother   . Diabetes Other   . Heart attack Other      Social History Mr. Cornell reports that he has never smoked. He does not have any smokeless tobacco history on file. Mr. Crismon reports that he does not drink alcohol.   Review of Systems CONSTITUTIONAL: No weight loss, fever, chills, weakness or fatigue.  HEENT: Eyes: No visual loss, blurred vision, double vision or yellow sclerae.No hearing loss, sneezing, congestion, runny nose or sore throat.  SKIN: No rash or itching.  CARDIOVASCULAR: per hpi RESPIRATORY: No shortness of breath, cough or sputum.  GASTROINTESTINAL: No anorexia, nausea, vomiting or diarrhea. No abdominal pain or blood.  GENITOURINARY: No burning on urination, no polyuria NEUROLOGICAL: No headache, dizziness, syncope, paralysis, ataxia, numbness or tingling in the extremities. No change in bowel or bladder control.  MUSCULOSKELETAL: No muscle, back pain, joint pain or stiffness.  LYMPHATICS: No enlarged nodes. No history of splenectomy.  PSYCHIATRIC: No history of depression or anxiety.  ENDOCRINOLOGIC: No reports of sweating, cold or heat intolerance. No polyuria or polydipsia.  Marland Kitchen   Physical Examination Vitals:   01/03/17 1005  BP: 104/64  Pulse: 61   Vitals:   01/03/17 1005  Weight:  206 lb (93.4 kg)  Height: 5\' 6"  (1.676 m)    Gen: resting comfortably, no acute distress HEENT: no scleral icterus, pupils equal round and reactive, no palptable cervical adenopathy,  CV: irreg, no m/r/g, no jvd Resp: Clear to auscultation bilaterally GI: abdomen is soft, non-tender, non-distended, normal bowel sounds, no hepatosplenomegaly MSK: extremities are warm, no edema.  Skin: warm, no rash Neuro:  no focal deficits Psych: appropriate affect   Diagnostic Studies 07/2007 TEE + echo contrast, appendage with low velocities, normal LV function     Assessment and Plan    1. Afib - no current symptoms. EKG in clinic shows rate controlled afib - he will continue current meds  2. Hyperlipidemia - request labs from pcp - he will continue current staitn  F/u 1 year      Arnoldo Lenis, M.D.

## 2017-01-09 ENCOUNTER — Telehealth: Payer: Self-pay | Admitting: Cardiology

## 2017-01-09 NOTE — Telephone Encounter (Signed)
Pt is needing the generic of coumadin sent to the pharmacy, he's unable to afford the other

## 2017-01-10 MED ORDER — WARFARIN SODIUM 5 MG PO TABS: ORAL_TABLET | ORAL | 3 refills | Status: DC

## 2017-01-10 NOTE — Telephone Encounter (Signed)
Called pt about coumadin refill.  No problem calling in Warfarin but pt has not reported an INR result to me in 1 yr.  Pt is having INR's drawn every 6 wks by dialysis with other labs but they are not sending [place] copies.  Told pt I could not refill warfarin if he was not going to have [place] monitor it.  He states he was not aware we were not getting results.  [facility] or Saturday for [facility]R.  I will send lab request and call pt with results.  He is to call me back if he has not heard from me within 2 days after having blood drawn.  Rx for warfarin sent to [facility] per request.

## 2017-01-23 ENCOUNTER — Other Ambulatory Visit: Payer: Self-pay | Admitting: Cardiology

## 2017-01-23 DIAGNOSIS — I4891 Unspecified atrial fibrillation: Secondary | ICD-10-CM | POA: Diagnosis not present

## 2017-01-23 DIAGNOSIS — Z5181 Encounter for therapeutic drug level monitoring: Secondary | ICD-10-CM | POA: Diagnosis not present

## 2017-01-23 DIAGNOSIS — Z79899 Other long term (current) drug therapy: Secondary | ICD-10-CM | POA: Diagnosis not present

## 2017-01-23 DIAGNOSIS — I775 Necrosis of artery: Secondary | ICD-10-CM | POA: Diagnosis not present

## 2017-01-23 DIAGNOSIS — I776 Arteritis, unspecified: Secondary | ICD-10-CM | POA: Diagnosis not present

## 2017-01-23 LAB — PROTIME-INR: INR: 2.1 — AB (ref 0.9–1.1)

## 2017-01-24 LAB — PROTIME-INR
INR: 2.1 — AB (ref 0.8–1.2)
Prothrombin Time: 21.6 s — ABNORMAL HIGH (ref 9.1–12.0)

## 2017-01-29 ENCOUNTER — Ambulatory Visit (INDEPENDENT_AMBULATORY_CARE_PROVIDER_SITE_OTHER): Payer: PPO | Admitting: Urology

## 2017-01-29 ENCOUNTER — Encounter (HOSPITAL_COMMUNITY)
Admission: AD | Admit: 2017-01-29 | Discharge: 2017-01-29 | Disposition: A | Discharge status: Home / Self Care | Payer: PPO | Source: Skilled Nursing Facility | Attending: Internal Medicine | Admitting: Internal Medicine

## 2017-01-29 DIAGNOSIS — R311 Benign essential microscopic hematuria: Secondary | ICD-10-CM | POA: Diagnosis not present

## 2017-01-29 DIAGNOSIS — N5201 Erectile dysfunction due to arterial insufficiency: Secondary | ICD-10-CM | POA: Diagnosis not present

## 2017-01-29 LAB — URINALYSIS, ROUTINE W REFLEX MICROSCOPIC
BILIRUBIN URINE: NEGATIVE
Bacteria, UA: NONE SEEN
GLUCOSE, UA: NEGATIVE mg/dL
KETONES UR: NEGATIVE mg/dL
LEUKOCYTES UA: NEGATIVE
NITRITE: NEGATIVE
PH: 5 (ref 5.0–8.0)
Protein, ur: NEGATIVE mg/dL
Specific Gravity, Urine: 1.012 (ref 1.005–1.030)

## 2017-01-30 DIAGNOSIS — R319 Hematuria, unspecified: Secondary | ICD-10-CM | POA: Diagnosis not present

## 2017-01-31 ENCOUNTER — Telehealth: Payer: Self-pay | Admitting: *Deleted

## 2017-01-31 LAB — URINE CULTURE: CULTURE: NO GROWTH

## 2017-01-31 NOTE — Telephone Encounter (Signed)
Results of INR done @ [facility] / tg

## 2017-02-04 ENCOUNTER — Ambulatory Visit (INDEPENDENT_AMBULATORY_CARE_PROVIDER_SITE_OTHER): Payer: PPO | Admitting: *Deleted

## 2017-02-04 DIAGNOSIS — I482 Chronic atrial fibrillation, unspecified: Secondary | ICD-10-CM

## 2017-02-04 DIAGNOSIS — Z7901 Long term (current) use of anticoagulants: Secondary | ICD-10-CM

## 2017-02-04 NOTE — Telephone Encounter (Signed)
Done.  See coumadin note. 

## 2017-03-29 DIAGNOSIS — M109 Gout, unspecified: Secondary | ICD-10-CM | POA: Diagnosis not present

## 2017-03-29 DIAGNOSIS — R5383 Other fatigue: Secondary | ICD-10-CM | POA: Diagnosis not present

## 2017-03-29 DIAGNOSIS — M1A9XX0 Chronic gout, unspecified, without tophus (tophi): Secondary | ICD-10-CM | POA: Diagnosis not present

## 2017-03-29 DIAGNOSIS — R634 Abnormal weight loss: Secondary | ICD-10-CM | POA: Diagnosis not present

## 2017-03-29 DIAGNOSIS — Z79899 Other long term (current) drug therapy: Secondary | ICD-10-CM | POA: Diagnosis not present

## 2017-03-29 DIAGNOSIS — I776 Arteritis, unspecified: Secondary | ICD-10-CM | POA: Diagnosis not present

## 2017-04-10 ENCOUNTER — Other Ambulatory Visit: Payer: Self-pay | Admitting: Cardiology

## 2017-04-23 ENCOUNTER — Other Ambulatory Visit: Payer: Self-pay | Admitting: Cardiology

## 2017-04-23 DIAGNOSIS — I4891 Unspecified atrial fibrillation: Secondary | ICD-10-CM | POA: Diagnosis not present

## 2017-04-23 DIAGNOSIS — Z79899 Other long term (current) drug therapy: Secondary | ICD-10-CM | POA: Diagnosis not present

## 2017-04-23 DIAGNOSIS — I775 Necrosis of artery: Secondary | ICD-10-CM | POA: Diagnosis not present

## 2017-04-23 DIAGNOSIS — Z5181 Encounter for therapeutic drug level monitoring: Secondary | ICD-10-CM | POA: Diagnosis not present

## 2017-04-23 DIAGNOSIS — I776 Arteritis, unspecified: Secondary | ICD-10-CM | POA: Diagnosis not present

## 2017-04-24 LAB — PROTIME-INR
INR: 2.2 — ABNORMAL HIGH (ref 0.8–1.2)
Prothrombin Time: 22.1 s — ABNORMAL HIGH (ref 9.1–12.0)

## 2017-05-01 DIAGNOSIS — E785 Hyperlipidemia, unspecified: Secondary | ICD-10-CM | POA: Diagnosis not present

## 2017-05-01 DIAGNOSIS — Z79899 Other long term (current) drug therapy: Secondary | ICD-10-CM | POA: Diagnosis not present

## 2017-05-07 DIAGNOSIS — N183 Chronic kidney disease, stage 3 (moderate): Secondary | ICD-10-CM | POA: Diagnosis not present

## 2017-05-07 DIAGNOSIS — E785 Hyperlipidemia, unspecified: Secondary | ICD-10-CM | POA: Diagnosis not present

## 2017-05-07 DIAGNOSIS — F325 Major depressive disorder, single episode, in full remission: Secondary | ICD-10-CM | POA: Diagnosis not present

## 2017-05-07 DIAGNOSIS — I482 Chronic atrial fibrillation: Secondary | ICD-10-CM | POA: Diagnosis not present

## 2017-05-09 DIAGNOSIS — Z23 Encounter for immunization: Secondary | ICD-10-CM | POA: Diagnosis not present

## 2017-05-10 ENCOUNTER — Other Ambulatory Visit: Payer: Self-pay | Admitting: Cardiology

## 2017-06-10 ENCOUNTER — Other Ambulatory Visit: Payer: Self-pay | Admitting: Cardiology

## 2017-06-10 DIAGNOSIS — I776 Arteritis, unspecified: Secondary | ICD-10-CM | POA: Diagnosis not present

## 2017-06-10 DIAGNOSIS — I775 Necrosis of artery: Secondary | ICD-10-CM | POA: Diagnosis not present

## 2017-06-10 DIAGNOSIS — Z79899 Other long term (current) drug therapy: Secondary | ICD-10-CM | POA: Diagnosis not present

## 2017-06-10 DIAGNOSIS — Z5181 Encounter for therapeutic drug level monitoring: Secondary | ICD-10-CM | POA: Diagnosis not present

## 2017-06-10 DIAGNOSIS — I4891 Unspecified atrial fibrillation: Secondary | ICD-10-CM | POA: Diagnosis not present

## 2017-06-11 ENCOUNTER — Other Ambulatory Visit: Payer: Self-pay | Admitting: Cardiology

## 2017-06-11 LAB — PROTIME-INR
INR: 2.3 — ABNORMAL HIGH (ref 0.8–1.2)
PROTHROMBIN TIME: 23.4 s — AB (ref 9.1–12.0)

## 2017-06-26 ENCOUNTER — Telehealth: Payer: Self-pay | Admitting: *Deleted

## 2017-06-26 ENCOUNTER — Ambulatory Visit (INDEPENDENT_AMBULATORY_CARE_PROVIDER_SITE_OTHER): Payer: PPO | Admitting: *Deleted

## 2017-06-26 DIAGNOSIS — I482 Chronic atrial fibrillation, unspecified: Secondary | ICD-10-CM

## 2017-06-26 DIAGNOSIS — Z7901 Long term (current) use of anticoagulants: Secondary | ICD-10-CM

## 2017-06-26 NOTE — Telephone Encounter (Signed)
Please call patient regarding INR done at the lab. / tg

## 2017-06-26 NOTE — Telephone Encounter (Signed)
See coumadin note. 

## 2017-07-24 ENCOUNTER — Ambulatory Visit (INDEPENDENT_AMBULATORY_CARE_PROVIDER_SITE_OTHER): Payer: PPO | Admitting: *Deleted

## 2017-07-24 DIAGNOSIS — Z7901 Long term (current) use of anticoagulants: Secondary | ICD-10-CM

## 2017-07-24 DIAGNOSIS — I482 Chronic atrial fibrillation, unspecified: Secondary | ICD-10-CM

## 2017-07-24 LAB — POCT INR: INR: 5.1

## 2017-08-13 ENCOUNTER — Other Ambulatory Visit: Payer: Self-pay | Admitting: Cardiovascular Disease

## 2017-08-19 ENCOUNTER — Ambulatory Visit (INDEPENDENT_AMBULATORY_CARE_PROVIDER_SITE_OTHER): Payer: PPO | Admitting: *Deleted

## 2017-08-19 DIAGNOSIS — Z7901 Long term (current) use of anticoagulants: Secondary | ICD-10-CM

## 2017-08-19 DIAGNOSIS — I482 Chronic atrial fibrillation, unspecified: Secondary | ICD-10-CM

## 2017-08-19 DIAGNOSIS — Z5181 Encounter for therapeutic drug level monitoring: Secondary | ICD-10-CM

## 2017-08-19 LAB — POCT INR: INR: 3.3

## 2017-08-27 DIAGNOSIS — Z79899 Other long term (current) drug therapy: Secondary | ICD-10-CM | POA: Diagnosis not present

## 2017-08-27 DIAGNOSIS — E785 Hyperlipidemia, unspecified: Secondary | ICD-10-CM | POA: Diagnosis not present

## 2017-09-05 DIAGNOSIS — F3340 Major depressive disorder, recurrent, in remission, unspecified: Secondary | ICD-10-CM | POA: Diagnosis not present

## 2017-09-05 DIAGNOSIS — Z23 Encounter for immunization: Secondary | ICD-10-CM | POA: Diagnosis not present

## 2017-09-05 DIAGNOSIS — I482 Chronic atrial fibrillation: Secondary | ICD-10-CM | POA: Diagnosis not present

## 2017-09-05 DIAGNOSIS — E785 Hyperlipidemia, unspecified: Secondary | ICD-10-CM | POA: Diagnosis not present

## 2017-09-11 DIAGNOSIS — Z79899 Other long term (current) drug therapy: Secondary | ICD-10-CM | POA: Diagnosis not present

## 2017-09-11 DIAGNOSIS — R5383 Other fatigue: Secondary | ICD-10-CM | POA: Diagnosis not present

## 2017-09-11 DIAGNOSIS — M109 Gout, unspecified: Secondary | ICD-10-CM | POA: Diagnosis not present

## 2017-09-11 DIAGNOSIS — I776 Arteritis, unspecified: Secondary | ICD-10-CM | POA: Diagnosis not present

## 2017-09-16 ENCOUNTER — Encounter: Payer: Self-pay | Admitting: *Deleted

## 2017-12-12 ENCOUNTER — Other Ambulatory Visit: Payer: Self-pay | Admitting: Cardiology

## 2017-12-16 DIAGNOSIS — I776 Arteritis, unspecified: Secondary | ICD-10-CM | POA: Diagnosis not present

## 2017-12-16 DIAGNOSIS — I775 Necrosis of artery: Secondary | ICD-10-CM | POA: Diagnosis not present

## 2017-12-16 DIAGNOSIS — Z79899 Other long term (current) drug therapy: Secondary | ICD-10-CM | POA: Diagnosis not present

## 2018-02-10 ENCOUNTER — Other Ambulatory Visit: Payer: Self-pay | Admitting: Cardiology

## 2018-02-12 DIAGNOSIS — I482 Chronic atrial fibrillation: Secondary | ICD-10-CM | POA: Diagnosis not present

## 2018-02-12 DIAGNOSIS — I776 Arteritis, unspecified: Secondary | ICD-10-CM | POA: Diagnosis not present

## 2018-02-12 DIAGNOSIS — F3340 Major depressive disorder, recurrent, in remission, unspecified: Secondary | ICD-10-CM | POA: Diagnosis not present

## 2018-02-12 DIAGNOSIS — Z23 Encounter for immunization: Secondary | ICD-10-CM | POA: Diagnosis not present

## 2018-02-12 DIAGNOSIS — Z6836 Body mass index (BMI) 36.0-36.9, adult: Secondary | ICD-10-CM | POA: Diagnosis not present

## 2018-02-17 ENCOUNTER — Encounter: Payer: Self-pay | Admitting: Cardiology

## 2018-02-19 ENCOUNTER — Ambulatory Visit (INDEPENDENT_AMBULATORY_CARE_PROVIDER_SITE_OTHER): Payer: PPO | Admitting: *Deleted

## 2018-02-19 DIAGNOSIS — Z7901 Long term (current) use of anticoagulants: Secondary | ICD-10-CM | POA: Diagnosis not present

## 2018-02-19 DIAGNOSIS — Z5181 Encounter for therapeutic drug level monitoring: Secondary | ICD-10-CM | POA: Diagnosis not present

## 2018-02-19 DIAGNOSIS — I482 Chronic atrial fibrillation, unspecified: Secondary | ICD-10-CM

## 2018-02-19 LAB — POCT INR: INR: 2.4

## 2018-02-19 NOTE — Patient Instructions (Signed)
Continue coumadin 1/2 tablet daily except 1 tablet on Tuesdays Eat greens consistent Recheck 6 weeks

## 2018-02-28 ENCOUNTER — Ambulatory Visit: Payer: PPO | Admitting: Cardiology

## 2018-02-28 ENCOUNTER — Encounter: Payer: Self-pay | Admitting: Cardiology

## 2018-02-28 VITALS — BP 104/68 | HR 62 | Ht 67.0 in | Wt 229.0 lb

## 2018-02-28 DIAGNOSIS — I4891 Unspecified atrial fibrillation: Secondary | ICD-10-CM | POA: Diagnosis not present

## 2018-02-28 DIAGNOSIS — E782 Mixed hyperlipidemia: Secondary | ICD-10-CM | POA: Diagnosis not present

## 2018-02-28 NOTE — Patient Instructions (Signed)
Medication Instructions:  Your physician recommends that you continue on your current medications as directed. Please refer to the Current Medication list given to you today.   Labwork: NONE   Testing/Procedures: NONE   Follow-Up: Your physician wants you to follow-up in: 1 Year with Dr. Branch.  You will receive a reminder letter in the mail two months in advance. If you don't receive a letter, please call our office to schedule the follow-up appointment.   Any Other Special Instructions Will Be Listed Below (If Applicable).     If you need a refill on your cardiac medications before your next appointment, please call your pharmacy. Thank you for choosing [facility]!    

## 2018-02-28 NOTE — Progress Notes (Signed)
Clinical Summary Dillon Strickland is a 67 y.o.male seen today for follow up of the following medical problems.   1. Chronic afib - a trial of eliquis caused some fatigue per his report, he is happy with coumadin.    - no recent palpitations - no bleeding on coumadin.   2. Hyperlipidemia - compliant with statin  - upcoming labs with pcp   3. Vasculitis - followed at [facility] rheumatology - ANCA + vasculitis. - has been on methotrexate  - no recent symptoms   Past Medical History:  Diagnosis Date  . Chronic anticoagulation    Colonoscopy with polypectomy in 2006; due for a repeat procedure as of 2012  . Chronic atrial fibrillation ([place]) 2001   Spontaneous contrast on TEE  . Degenerative joint disease of spine    Mild; cervical and lumbosacral  . Hyperlipidemia   . Overweight(278.02)   . Pseudogout      Allergies  Allergen Reactions  . Uloric [Febuxostat] Rash     Current Outpatient Medications  Medication Sig Dispense Refill  . allopurinol (ZYLOPRIM) 100 MG tablet Take 300 mg by mouth.    Marland Kitchen atenolol (TENORMIN) 50 MG tablet TAKE ONE TABLET BY MOUTH DAILY. 30 tablet 11  . atorvastatin (LIPITOR) 20 MG tablet Take 20 mg by mouth daily.    Marland Kitchen diltiazem (CARTIA XT) 240 MG 24 hr capsule Take 1 capsule (240 mg total) by mouth daily. 30 capsule 1  . escitalopram (LEXAPRO) 20 MG tablet Take 20 mg by mouth daily.    . folic acid (FOLVITE) 1 MG tablet Take 1 mg by mouth.    . methotrexate (RHEUMATREX) 2.5 MG tablet Take 2 tablets (5 mg) once a week.    . warfarin (COUMADIN) 5 MG tablet TAKE 1/2 TABLET BY MOUTH DAILY EXCEPT 1 TABLET ON TUESDAYS OR AS DIRECTED. 15 tablet 0   No current facility-administered medications for this visit.      Past Surgical History:  Procedure Laterality Date  . COLONOSCOPY N/A 04/23/2013   Procedure: COLONOSCOPY;  Surgeon: Rogene Houston, MD;  Location[facility];  Service: Endoscopy;  Laterality: N/A;  1030-rescheduled to [place] notified pt  . COLONOSCOPY W/ POLYPECTOMY  2006     Allergies  Allergen Reactions  . Uloric [Febuxostat] Rash      Family History  Problem Relation Age of Onset  . Stroke Mother   . Cancer Mother   . Heart attack Father   . Arrhythmia Sister        Atrial fibrillation  . Arrhythmia Brother   . Arrhythmia Brother   . Diabetes Other   . Heart attack Other   . Colon cancer Neg Hx      Social History [name] reports that he has never smoked. He has never used smokeless tobacco. [name] reports that he does not drink alcohol.   Review of Systems CONSTITUTIONAL: No weight loss, fever, chills, weakness or fatigue.  HEENT: Eyes: No visual loss, blurred vision, double vision or yellow sclerae.No hearing loss, sneezing, congestion, runny nose or sore throat.  SKIN: No rash or itching.  CARDIOVASCULAR: per hpi RESPIRATORY: No shortness of breath, cough or sputum.  GASTROINTESTINAL: No anorexia, nausea, vomiting or diarrhea. No abdominal pain or blood.  GENITOURINARY: No burning on urination, no polyuria NEUROLOGICAL: No headache, dizziness, syncope, paralysis, ataxia, numbness or tingling in the extremities. No change in bowel or bladder control.  MUSCULOSKELETAL: No muscle, back pain, joint pain or  stiffness.  LYMPHATICS: No enlarged nodes. No history of splenectomy.  PSYCHIATRIC: No history of depression or anxiety.  ENDOCRINOLOGIC: No reports of sweating, cold or heat intolerance. No polyuria or polydipsia.  Marland Kitchen   Physical Examination Vitals:   02/28/18 0928  BP: 104/68  Pulse: 62  SpO2: 96%   Vitals:   02/28/18 0928  Weight: 229 lb (103.9 kg)  Height: 5\' 7"  (1.702 m)    Gen: resting comfortably, no acute distress HEENT: no scleral icterus, pupils equal round and reactive, no palptable cervical adenopathy,  CV: irreg, no m/r/g, no jvd Resp: Clear to auscultation bilaterally GI: abdomen is soft, non-tender, non-distended, normal bowel sounds, no  hepatosplenomegaly MSK: extremities are warm, no edema.  Skin: warm, no rash Neuro:  no focal deficits Psych: appropriate affect   Diagnostic Studies 07/2007 TEE + echo contrast, appendage with low velocities, normal LV function     Assessment and Plan  1. Afib - no symptoms. COntinue current meds including anticoag. He has not been interested in DOACs, continue coumadin.  - EKG in clinic shows rate controlled afib.   2. Hyperlipidemia - continue statin, f/u labs with pcp    F/u 1 year       Arnoldo Lenis, M.D.

## 2018-03-03 ENCOUNTER — Encounter: Payer: Self-pay | Admitting: Cardiology

## 2018-03-11 ENCOUNTER — Telehealth: Payer: Self-pay | Admitting: Cardiology

## 2018-03-11 MED ORDER — WARFARIN SODIUM 5 MG PO TABS: ORAL_TABLET | ORAL | 1 refills | Status: DC

## 2018-03-11 NOTE — Telephone Encounter (Signed)
Needs refill for Warfarin sent to [facility]/tg

## 2018-04-02 ENCOUNTER — Ambulatory Visit (INDEPENDENT_AMBULATORY_CARE_PROVIDER_SITE_OTHER): Payer: PPO | Admitting: *Deleted

## 2018-04-02 DIAGNOSIS — Z7901 Long term (current) use of anticoagulants: Secondary | ICD-10-CM | POA: Diagnosis not present

## 2018-04-02 DIAGNOSIS — I482 Chronic atrial fibrillation, unspecified: Secondary | ICD-10-CM

## 2018-04-02 DIAGNOSIS — Z5181 Encounter for therapeutic drug level monitoring: Secondary | ICD-10-CM

## 2018-04-02 LAB — POCT INR: INR: 3.9

## 2018-04-02 NOTE — Patient Instructions (Signed)
Hold coumadin tomorrow then resume 1/2 tablet daily except 1 tablet on Tuesdays Eat extra greens today Recheck 4 weeks

## 2018-04-03 ENCOUNTER — Encounter (INDEPENDENT_AMBULATORY_CARE_PROVIDER_SITE_OTHER): Payer: Self-pay | Admitting: *Deleted

## 2018-04-30 ENCOUNTER — Ambulatory Visit (INDEPENDENT_AMBULATORY_CARE_PROVIDER_SITE_OTHER): Payer: PPO | Admitting: *Deleted

## 2018-04-30 DIAGNOSIS — I482 Chronic atrial fibrillation, unspecified: Secondary | ICD-10-CM

## 2018-04-30 DIAGNOSIS — Z7901 Long term (current) use of anticoagulants: Secondary | ICD-10-CM

## 2018-04-30 LAB — POCT INR: INR: 3.5 — AB (ref 2.0–3.0)

## 2018-04-30 NOTE — Patient Instructions (Signed)
Hold coumadin today then decrease dose to 1/2 tablet daily Eat extra greens today Recheck 4 weeks

## 2018-05-01 DIAGNOSIS — Z79899 Other long term (current) drug therapy: Secondary | ICD-10-CM | POA: Diagnosis not present

## 2018-05-01 DIAGNOSIS — I775 Necrosis of artery: Secondary | ICD-10-CM | POA: Diagnosis not present

## 2018-05-01 DIAGNOSIS — I776 Arteritis, unspecified: Secondary | ICD-10-CM | POA: Diagnosis not present

## 2018-05-12 ENCOUNTER — Other Ambulatory Visit: Payer: Self-pay | Admitting: Cardiology

## 2018-05-19 ENCOUNTER — Other Ambulatory Visit: Payer: Self-pay | Admitting: Cardiology

## 2018-05-19 DIAGNOSIS — Z79899 Other long term (current) drug therapy: Secondary | ICD-10-CM | POA: Diagnosis not present

## 2018-05-19 DIAGNOSIS — I776 Arteritis, unspecified: Secondary | ICD-10-CM | POA: Diagnosis not present

## 2018-05-19 DIAGNOSIS — I775 Necrosis of artery: Secondary | ICD-10-CM | POA: Diagnosis not present

## 2018-06-23 DIAGNOSIS — R05 Cough: Secondary | ICD-10-CM | POA: Diagnosis not present

## 2018-07-02 DIAGNOSIS — M109 Gout, unspecified: Secondary | ICD-10-CM | POA: Diagnosis not present

## 2018-07-02 DIAGNOSIS — Z79899 Other long term (current) drug therapy: Secondary | ICD-10-CM | POA: Diagnosis not present

## 2018-07-02 DIAGNOSIS — I776 Arteritis, unspecified: Secondary | ICD-10-CM | POA: Diagnosis not present

## 2018-07-02 DIAGNOSIS — M301 Polyarteritis with lung involvement [Churg-Strauss]: Secondary | ICD-10-CM | POA: Diagnosis not present

## 2018-07-23 DIAGNOSIS — N183 Chronic kidney disease, stage 3 (moderate): Secondary | ICD-10-CM | POA: Diagnosis not present

## 2018-07-23 DIAGNOSIS — L237 Allergic contact dermatitis due to plants, except food: Secondary | ICD-10-CM | POA: Diagnosis not present

## 2018-09-24 ENCOUNTER — Other Ambulatory Visit: Payer: Self-pay | Admitting: *Deleted

## 2018-09-30 ENCOUNTER — Ambulatory Visit (HOSPITAL_COMMUNITY)
Admission: RE | Admit: 2018-09-30 | Discharge: 2018-09-30 | Disposition: A | Discharge status: Home / Self Care | Payer: PPO | Source: Ambulatory Visit | Attending: Internal Medicine | Admitting: Internal Medicine

## 2018-09-30 ENCOUNTER — Other Ambulatory Visit (HOSPITAL_COMMUNITY): Payer: Self-pay | Admitting: Internal Medicine

## 2018-09-30 DIAGNOSIS — T148XXA Other injury of unspecified body region, initial encounter: Secondary | ICD-10-CM | POA: Diagnosis present

## 2018-09-30 DIAGNOSIS — S92351A Displaced fracture of fifth metatarsal bone, right foot, initial encounter for closed fracture: Secondary | ICD-10-CM | POA: Diagnosis not present

## 2018-09-30 DIAGNOSIS — M79671 Pain in right foot: Secondary | ICD-10-CM | POA: Diagnosis not present

## 2018-09-30 DIAGNOSIS — M1000 Idiopathic gout, unspecified site: Secondary | ICD-10-CM | POA: Diagnosis not present

## 2018-12-18 ENCOUNTER — Other Ambulatory Visit: Payer: Self-pay | Admitting: *Deleted

## 2018-12-18 MED ORDER — WARFARIN SODIUM 5 MG PO TABS: ORAL_TABLET | ORAL | 0 refills | Status: DC

## 2018-12-18 NOTE — Telephone Encounter (Signed)
Pt has not had his INR checked since May 2019 and is 7 months overdue for an INR check. He does have an appt scheduled for 1/13. Will send in 5 days worth of warfarin to last until his INR check. He will need to become compliant with INR checks before larger quantities of warfarin will be refilled.

## 2018-12-18 NOTE — Telephone Encounter (Signed)
Needing refill for his warfarin (COUMADIN) 5 MG tablet [[id number]]  Sent to [facility], he's been w/o it for 10 days.

## 2018-12-22 ENCOUNTER — Ambulatory Visit (INDEPENDENT_AMBULATORY_CARE_PROVIDER_SITE_OTHER): Payer: PPO | Admitting: Pharmacist

## 2018-12-22 DIAGNOSIS — I482 Chronic atrial fibrillation, unspecified: Secondary | ICD-10-CM | POA: Diagnosis not present

## 2018-12-22 DIAGNOSIS — Z7901 Long term (current) use of anticoagulants: Secondary | ICD-10-CM

## 2018-12-22 LAB — POCT INR: INR: 2.1 (ref 2.0–3.0)

## 2018-12-22 MED ORDER — WARFARIN SODIUM 5 MG PO TABS: ORAL_TABLET | ORAL | 0 refills | Status: DC

## 2018-12-22 NOTE — Patient Instructions (Signed)
Description   Continue 1/2 tablet daily, except for 1 tablet on Tuesdays Eat extra greens today Recheck 4 weeks

## 2018-12-26 DIAGNOSIS — I776 Arteritis, unspecified: Secondary | ICD-10-CM | POA: Diagnosis not present

## 2018-12-26 DIAGNOSIS — Z79899 Other long term (current) drug therapy: Secondary | ICD-10-CM | POA: Diagnosis not present

## 2019-01-06 DIAGNOSIS — Z23 Encounter for immunization: Secondary | ICD-10-CM | POA: Diagnosis not present

## 2019-01-21 ENCOUNTER — Ambulatory Visit (INDEPENDENT_AMBULATORY_CARE_PROVIDER_SITE_OTHER): Payer: PPO | Admitting: Pharmacist

## 2019-01-21 DIAGNOSIS — I482 Chronic atrial fibrillation, unspecified: Secondary | ICD-10-CM

## 2019-01-21 DIAGNOSIS — Z7901 Long term (current) use of anticoagulants: Secondary | ICD-10-CM | POA: Diagnosis not present

## 2019-01-21 LAB — POCT INR: INR: 2.5 (ref 2.0–3.0)

## 2019-01-21 NOTE — Patient Instructions (Signed)
Description   Continue 1/2 tablet daily, except for 1 tablet on Tuesdays Eat extra greens today Recheck 6 weeks

## 2019-02-18 DIAGNOSIS — I776 Arteritis, unspecified: Secondary | ICD-10-CM | POA: Diagnosis not present

## 2019-02-18 DIAGNOSIS — Z79899 Other long term (current) drug therapy: Secondary | ICD-10-CM | POA: Diagnosis not present

## 2019-02-18 DIAGNOSIS — Q600 Renal agenesis, unilateral: Secondary | ICD-10-CM | POA: Diagnosis not present

## 2019-02-18 DIAGNOSIS — M109 Gout, unspecified: Secondary | ICD-10-CM | POA: Diagnosis not present

## 2019-03-10 ENCOUNTER — Other Ambulatory Visit: Payer: Self-pay | Admitting: *Deleted

## 2019-03-10 ENCOUNTER — Other Ambulatory Visit: Payer: Self-pay | Admitting: Cardiology

## 2019-03-10 MED ORDER — WARFARIN SODIUM 5 MG PO TABS: ORAL_TABLET | ORAL | 0 refills | Status: DC

## 2019-03-10 NOTE — Telephone Encounter (Signed)
°*  STAT* If patient is at the pharmacy, call can be transferred to refill team.   1. Which medications need to be refilled? (please list name of each medication and dose if known) warfarin  2. Which pharmacy/location (including street and city if local pharmacy) is medication to be sent to? [facility]  In [place]  3. Do they need a 30 day or 90 day supply?

## 2019-03-11 ENCOUNTER — Ambulatory Visit: Payer: PPO | Admitting: Cardiology

## 2019-03-11 DIAGNOSIS — M1000 Idiopathic gout, unspecified site: Secondary | ICD-10-CM | POA: Diagnosis not present

## 2019-03-11 DIAGNOSIS — M1380 Other specified arthritis, unspecified site: Secondary | ICD-10-CM | POA: Diagnosis not present

## 2019-03-11 DIAGNOSIS — M25571 Pain in right ankle and joints of right foot: Secondary | ICD-10-CM | POA: Diagnosis not present

## 2019-04-22 ENCOUNTER — Telehealth: Payer: Self-pay | Admitting: *Deleted

## 2019-04-22 NOTE — Telephone Encounter (Signed)

## 2019-04-23 ENCOUNTER — Ambulatory Visit (INDEPENDENT_AMBULATORY_CARE_PROVIDER_SITE_OTHER): Payer: PPO | Admitting: *Deleted

## 2019-04-23 DIAGNOSIS — I482 Chronic atrial fibrillation, unspecified: Secondary | ICD-10-CM

## 2019-04-23 DIAGNOSIS — Z5181 Encounter for therapeutic drug level monitoring: Secondary | ICD-10-CM

## 2019-04-23 DIAGNOSIS — Z7901 Long term (current) use of anticoagulants: Secondary | ICD-10-CM

## 2019-04-23 LAB — POCT INR: INR: 3.5 — AB (ref 2.0–3.0)

## 2019-04-23 NOTE — Patient Instructions (Signed)
Took coumadin this morning Hold coumadin tomorrow then resume 1/2 tablet daily, except for 1 tablet on Tuesdays Has taken some prednisone for gout. Eat extra greens today Recheck 6 weeks

## 2019-06-08 DIAGNOSIS — I776 Arteritis, unspecified: Secondary | ICD-10-CM | POA: Diagnosis not present

## 2019-06-08 DIAGNOSIS — Z79899 Other long term (current) drug therapy: Secondary | ICD-10-CM | POA: Diagnosis not present

## 2019-06-09 ENCOUNTER — Telehealth: Payer: Self-pay | Admitting: Cardiology

## 2019-06-09 DIAGNOSIS — I482 Chronic atrial fibrillation, unspecified: Secondary | ICD-10-CM

## 2019-06-09 DIAGNOSIS — Z5181 Encounter for therapeutic drug level monitoring: Secondary | ICD-10-CM

## 2019-06-09 MED ORDER — WARFARIN SODIUM 5 MG PO TABS: ORAL_TABLET | ORAL | 3 refills | Status: DC

## 2019-06-09 NOTE — Telephone Encounter (Signed)
Having problems getting his warfarin (COUMADIN) 5 MG tablet [[id number]]  Filled.

## 2019-06-10 ENCOUNTER — Telehealth: Payer: Self-pay | Admitting: Cardiology

## 2019-06-10 NOTE — Telephone Encounter (Signed)
Virtual Visit Pre-Appointment Phone Call  "(Name), I am calling you today to discuss your upcoming appointment. We are currently trying to limit exposure to the virus that causes COVID-19 by seeing patients at home rather than in the office."  1. "What is the BEST phone number to call the day of the visit?" - include this in appointment notes  2. Do you have or have access to (through a family member/friend) a smartphone with video capability that we can use for your visit?" a. If yes - list this number in appt notes as cell (if different from BEST phone #) and list the appointment type as a VIDEO visit in appointment notes b. If no - list the appointment type as a PHONE visit in appointment notes  3. Confirm consent - "In the setting of the current Covid19 crisis, you are scheduled for a (phone or video) visit with your provider on (date) at (time).  Just as we do with many in-office visits, in order for you to participate in this visit, we must obtain consent.  If you'd like, I can send this to your mychart (if signed up) or email for you to review.  Otherwise, I can obtain your verbal consent now.  All virtual visits are billed to your insurance company just like a normal visit would be.  By agreeing to a virtual visit, we'd like you to understand that the technology does not allow for your provider to perform an examination, and thus may limit your provider's ability to fully assess your condition. If your provider identifies any concerns that need to be evaluated in person, we will make arrangements to do so.  Finally, though the technology is pretty good, we cannot assure that it will always work on either your or our end, and in the setting of a video visit, we may have to convert it to a phone-only visit.  In either situation, we cannot ensure that we have a secure connection.  Are you willing to proceed?" STAFF: Did the patient verbally acknowledge consent to telehealth visit? Document  YES/NO here: Yes  4. Advise patient to be prepared - "Two hours prior to your appointment, go ahead and check your blood pressure, pulse, oxygen saturation, and your weight (if you have the equipment to check those) and write them all down. When your visit starts, your provider will ask you for this information. If you have an Apple Watch or Kardia device, please plan to have heart rate information ready on the day of your appointment. Please have a pen and paper handy nearby the day of the visit as well."  5. Give patient instructions for MyChart download to smartphone OR [email or web address] as below if video visit (depending on what platform provider is using)  6. Inform patient they will receive a phone call 15 minutes prior to their appointment time (may be from unknown caller ID) so they should be prepared to answer    TELEPHONE CALL NOTE  Aarsh G Attaway has been deemed a candidate for a follow-up tele-health visit to limit community exposure during the Covid-19 pandemic. I spoke with the patient via phone to ensure availability of phone/video source, confirm preferred email & phone number, and discuss instructions and expectations.  I reminded Emad G Skowron to be prepared with any vital sign and/or heart rhythm information that could potentially be obtained via home monitoring, at the time of his visit. I reminded Ulysse Siemen Polack to expect a phone call prior to  his visit.  Terry L Goins 06/10/2019 11:14 AM

## 2019-06-11 ENCOUNTER — Encounter: Payer: Self-pay | Admitting: Cardiology

## 2019-06-11 ENCOUNTER — Telehealth (INDEPENDENT_AMBULATORY_CARE_PROVIDER_SITE_OTHER): Payer: PPO | Admitting: Cardiology

## 2019-06-11 VITALS — Ht 67.5 in | Wt 227.0 lb

## 2019-06-11 DIAGNOSIS — E782 Mixed hyperlipidemia: Secondary | ICD-10-CM

## 2019-06-11 DIAGNOSIS — I482 Chronic atrial fibrillation, unspecified: Secondary | ICD-10-CM

## 2019-06-11 NOTE — Patient Instructions (Signed)

## 2019-06-11 NOTE — Progress Notes (Signed)
Virtual Visit via Telephone Note   This visit type was conducted due to national recommendations for restrictions regarding the COVID-19 Pandemic (e.g. social distancing) in an effort to limit this patient's exposure and mitigate transmission in our community.  Due to his co-morbid illnesses, this patient is at least at moderate risk for complications without adequate follow up.  This format is felt to be most appropriate for this patient at this time.  The patient did not have access to video technology/had technical difficulties with video requiring transitioning to audio format only (telephone).  All issues noted in this document were discussed and addressed.  No physical exam could be performed with this format.  Please refer to the patient's chart for his  consent to telehealth for [facility].   Date:  06/11/2019   ID:  Dillon Strickland, DOB [birth year], MRN [id number]  Patient Location: Home Provider Location: Home  PCP:  Asencion Noble, MD  Cardiologist:  Carlyle Dolly, MD Electrophysiologist:  None   Evaluation Performed:  Follow-Up Visit  Chief Complaint:  Follow up  History of Present Illness:    Dillon Strickland is a 68 y.o. male seen today for follow up of the following medical problems.   1. Chronic afib - a trial of eliquis caused some fatigue per his report, he is happy with coumadin.   - no recent palpitations. No bleeding issues on coumadin  2. Hyperlipidemia - labs followed by pcp - compliant with statin   3. Vasculitis - followed at [facility] rheumatology - ANCA + vasculitis. - has been on methotrexate      The patient does not have symptoms concerning for COVID-19 infection (fever, chills, cough, or new shortness of breath).    Past Medical History:  Diagnosis Date  . Chronic anticoagulation    Colonoscopy with polypectomy in 2006; due for a repeat procedure as of 2012  . Chronic atrial fibrillation 2001   Spontaneous contrast on TEE   . Degenerative joint disease of spine    Mild; cervical and lumbosacral  . Hyperlipidemia   . Overweight(278.02)   . Pseudogout    Past Surgical History:  Procedure Laterality Date  . COLONOSCOPY N/A 04/23/2013   Procedure: COLONOSCOPY;  Surgeon: Rogene Houston, MD;  Location[facility];  Service: Endoscopy;  Laterality: N/A;  1030-rescheduled to [place] notified pt  . COLONOSCOPY W/ POLYPECTOMY  2006     Current Meds  Medication Sig  . allopurinol (ZYLOPRIM) 100 MG tablet Take 300 mg by mouth.  Marland Kitchen atenolol (TENORMIN) 50 MG tablet TAKE ONE TABLET BY MOUTH DAILY.  Marland Kitchen atorvastatin (LIPITOR) 20 MG tablet Take 20 mg by mouth daily.  Marland Kitchen diltiazem (CARTIA XT) 240 MG 24 hr capsule Take 1 capsule (240 mg total) by mouth daily.  Marland Kitchen escitalopram (LEXAPRO) 10 MG tablet Take 10 mg by mouth daily.  Marland Kitchen warfarin (COUMADIN) 5 MG tablet Take 1/2 daily except 1 tablet on Tuesdays or as directed by Coumadin clinic.  . [DISCONTINUED] atenolol (TENORMIN) 50 MG tablet TAKE ONE TABLET BY MOUTH DAILY.  . [DISCONTINUED] folic acid (FOLVITE) 1 MG tablet Take 1 mg by mouth.  . [DISCONTINUED] methotrexate (RHEUMATREX) 2.5 MG tablet Take 2 tablets (5 mg) once a week.     Allergies:   Uloric [febuxostat]   Social History   Tobacco Use  . Smoking status: Never Smoker  . Smokeless tobacco: Never Used  Substance Use Topics  . Alcohol use: No    Comment: occasional  beer, glass of wine/month - denies at this time 05/18/14  . Drug use: No     Family Hx: The patient's family history includes Arrhythmia in his brother, brother, and sister; Cancer in his mother; Diabetes in an other family member; Heart attack in his father and another family member; Stroke in his mother. There is no history of Colon cancer.  ROS:   Please see the history of present illness.     All other systems reviewed and are negative.   Prior CV studies:   The following studies were reviewed today:  07/2007 TEE + echo contrast,  appendage with low velocities, normal LV function  Labs/Other Tests and Data Reviewed:    EKG:  No ECG reviewed.  Recent Labs: No results found for requested labs within last 8760 hours.   Recent Lipid Panel Lab Results  Component Value Date/Time   CHOL 163 01/05/2011 07:42 PM   TRIG 141 01/05/2011 07:42 PM   HDL 33 (L) 01/05/2011 07:42 PM   CHOLHDL 4.9 Ratio 01/05/2011 07:42 PM   LDLCALC 102 (H) 01/05/2011 07:42 PM    Wt Readings from Last 3 Encounters:  06/11/19 227 lb (103 kg)  02/28/18 229 lb (103.9 kg)  01/03/17 206 lb (93.4 kg)     Objective:    Vital Signs:  Ht 5' 7.5" (1.715 m)   Wt 227 lb (103 kg)   BMI 35.03 kg/m    Normal affect. Normal speech pattern and tone. Comfortable, no apparent distress. No audible signs of SOB or wheezing.     ASSESSMENT & PLAN:    1. Afib -no recent symptoms. He is not interested in NOACs, continue coumadin   2. Hyperlipidemia -labs followed by pcp, request results. Continue statin    F/u 1 year  COVID-19 Education: The signs and symptoms of COVID-19 were discussed with the patient and how to seek care for testing (follow up with PCP or arrange E-visit).  The importance of social distancing was discussed today.  Time:   Today, I have spent 13 minutes with the patient with telehealth technology discussing the above problems.     Medication Adjustments/Labs and Tests Ordered: Current medicines are reviewed at length with the patient today.  Concerns regarding medicines are outlined above.   Tests Ordered: No orders of the defined types were placed in this encounter.   Medication Changes: No orders of the defined types were placed in this encounter.   Follow Up:  In Person in 1 year(s)  Signed, Carlyle Dolly, MD  06/11/2019 10:40 AM    [place]

## 2019-06-18 ENCOUNTER — Ambulatory Visit (INDEPENDENT_AMBULATORY_CARE_PROVIDER_SITE_OTHER): Payer: PPO | Admitting: *Deleted

## 2019-06-18 DIAGNOSIS — Z5181 Encounter for therapeutic drug level monitoring: Secondary | ICD-10-CM

## 2019-06-18 DIAGNOSIS — Z7901 Long term (current) use of anticoagulants: Secondary | ICD-10-CM | POA: Diagnosis not present

## 2019-06-18 DIAGNOSIS — I482 Chronic atrial fibrillation, unspecified: Secondary | ICD-10-CM | POA: Diagnosis not present

## 2019-06-18 LAB — POCT INR: INR: 2.8 (ref 2.0–3.0)

## 2019-06-18 NOTE — Patient Instructions (Signed)
Continue coumadin 1/2 tablet daily, except for 1 tablet on Tuesdays Recheck 6 weeks

## 2019-06-23 ENCOUNTER — Telehealth: Payer: Self-pay

## 2019-06-23 MED ORDER — ATENOLOL 50 MG PO TABS: 50.0000 mg | ORAL_TABLET | Freq: Every day | ORAL | 3 refills | Status: DC

## 2019-06-23 NOTE — Telephone Encounter (Signed)
Pt needs refill. Sent to [facility].

## 2019-06-29 DIAGNOSIS — L723 Sebaceous cyst: Secondary | ICD-10-CM | POA: Diagnosis not present

## 2019-07-30 ENCOUNTER — Ambulatory Visit (INDEPENDENT_AMBULATORY_CARE_PROVIDER_SITE_OTHER): Payer: PPO | Admitting: *Deleted

## 2019-07-30 ENCOUNTER — Other Ambulatory Visit: Payer: Self-pay

## 2019-07-30 DIAGNOSIS — Z5181 Encounter for therapeutic drug level monitoring: Secondary | ICD-10-CM | POA: Diagnosis not present

## 2019-07-30 DIAGNOSIS — Z7901 Long term (current) use of anticoagulants: Secondary | ICD-10-CM | POA: Diagnosis not present

## 2019-07-30 DIAGNOSIS — I482 Chronic atrial fibrillation, unspecified: Secondary | ICD-10-CM

## 2019-07-30 LAB — POCT INR: INR: 3.1 — AB (ref 2.0–3.0)

## 2019-07-30 NOTE — Patient Instructions (Signed)
Description   Hold tomorrow's dose (since you already had today's dose) then continue coumadin 1/2 tablet daily, except for 1 tablet on Tuesdays. Recheck 6 weeks.

## 2019-09-10 ENCOUNTER — Other Ambulatory Visit: Payer: Self-pay

## 2019-09-10 ENCOUNTER — Ambulatory Visit (INDEPENDENT_AMBULATORY_CARE_PROVIDER_SITE_OTHER): Payer: PPO | Admitting: *Deleted

## 2019-09-10 DIAGNOSIS — I482 Chronic atrial fibrillation, unspecified: Secondary | ICD-10-CM

## 2019-09-10 DIAGNOSIS — Z5181 Encounter for therapeutic drug level monitoring: Secondary | ICD-10-CM

## 2019-09-10 DIAGNOSIS — Z7901 Long term (current) use of anticoagulants: Secondary | ICD-10-CM | POA: Diagnosis not present

## 2019-09-10 LAB — POCT INR: INR: 2.9 (ref 2.0–3.0)

## 2019-09-10 NOTE — Patient Instructions (Signed)
Continue coumadin 1/2 tablet daily except 1 tablet on Tuesdays. Recheck 6 weeks.

## 2019-09-23 ENCOUNTER — Other Ambulatory Visit: Payer: Self-pay | Admitting: *Deleted

## 2019-09-23 DIAGNOSIS — Z20828 Contact with and (suspected) exposure to other viral communicable diseases: Secondary | ICD-10-CM | POA: Diagnosis not present

## 2019-09-23 DIAGNOSIS — Z20822 Contact with and (suspected) exposure to covid-19: Secondary | ICD-10-CM

## 2019-09-24 LAB — NOVEL CORONAVIRUS, NAA: SARS-CoV-2, NAA: NOT DETECTED

## 2019-10-13 ENCOUNTER — Telehealth: Payer: Self-pay | Admitting: Pharmacist

## 2019-10-13 NOTE — Telephone Encounter (Signed)
Called pt to discuss changing from warfarin to [place] therapy due to improved efficacy and safety profile, as well as decreased monitoring in setting of COVID-19 pandemic.  Pt states he tried Eliquis in the past and he "felt sick" and lost 30 lbs in 2.5 weeks. He prefers to stay on warfarin.

## 2019-10-19 DIAGNOSIS — I776 Arteritis, unspecified: Secondary | ICD-10-CM | POA: Diagnosis not present

## 2019-10-19 DIAGNOSIS — Z79899 Other long term (current) drug therapy: Secondary | ICD-10-CM | POA: Diagnosis not present

## 2019-11-03 ENCOUNTER — Other Ambulatory Visit: Payer: Self-pay

## 2019-11-03 DIAGNOSIS — Z20822 Contact with and (suspected) exposure to covid-19: Secondary | ICD-10-CM

## 2019-11-05 LAB — NOVEL CORONAVIRUS, NAA: SARS-CoV-2, NAA: NOT DETECTED

## 2019-11-06 ENCOUNTER — Telehealth: Payer: Self-pay | Admitting: Internal Medicine

## 2019-11-06 NOTE — Telephone Encounter (Signed)
Patients wife is calling to receive the patient's negative COVID test results.

## 2019-11-09 ENCOUNTER — Other Ambulatory Visit: Payer: Self-pay | Admitting: Cardiology

## 2019-11-09 DIAGNOSIS — I482 Chronic atrial fibrillation, unspecified: Secondary | ICD-10-CM

## 2019-11-09 DIAGNOSIS — Z5181 Encounter for therapeutic drug level monitoring: Secondary | ICD-10-CM

## 2019-11-17 DIAGNOSIS — Z23 Encounter for immunization: Secondary | ICD-10-CM | POA: Diagnosis not present

## 2019-12-14 ENCOUNTER — Other Ambulatory Visit: Payer: Self-pay | Admitting: Cardiology

## 2019-12-14 DIAGNOSIS — Z5181 Encounter for therapeutic drug level monitoring: Secondary | ICD-10-CM

## 2019-12-14 DIAGNOSIS — I482 Chronic atrial fibrillation, unspecified: Secondary | ICD-10-CM

## 2020-01-12 ENCOUNTER — Other Ambulatory Visit: Payer: Self-pay | Admitting: Cardiovascular Disease

## 2020-01-12 DIAGNOSIS — I482 Chronic atrial fibrillation, unspecified: Secondary | ICD-10-CM

## 2020-01-12 DIAGNOSIS — Z5181 Encounter for therapeutic drug level monitoring: Secondary | ICD-10-CM

## 2020-01-15 ENCOUNTER — Other Ambulatory Visit: Payer: Self-pay | Admitting: Cardiology

## 2020-01-15 DIAGNOSIS — I482 Chronic atrial fibrillation, unspecified: Secondary | ICD-10-CM

## 2020-01-15 DIAGNOSIS — Z5181 Encounter for therapeutic drug level monitoring: Secondary | ICD-10-CM

## 2020-01-21 ENCOUNTER — Other Ambulatory Visit: Payer: Self-pay

## 2020-01-21 ENCOUNTER — Ambulatory Visit (INDEPENDENT_AMBULATORY_CARE_PROVIDER_SITE_OTHER): Payer: PPO | Admitting: *Deleted

## 2020-01-21 DIAGNOSIS — I482 Chronic atrial fibrillation, unspecified: Secondary | ICD-10-CM

## 2020-01-21 DIAGNOSIS — Z5181 Encounter for therapeutic drug level monitoring: Secondary | ICD-10-CM

## 2020-01-21 LAB — POCT INR: INR: 2.5 (ref 2.0–3.0)

## 2020-01-21 NOTE — Patient Instructions (Signed)
Continue coumadin 1/2 tablet daily except 1 tablet on Tuesdays. Recheck 6 weeks.

## 2020-02-18 DIAGNOSIS — I776 Arteritis, unspecified: Secondary | ICD-10-CM | POA: Diagnosis not present

## 2020-02-18 DIAGNOSIS — Z79899 Other long term (current) drug therapy: Secondary | ICD-10-CM | POA: Diagnosis not present

## 2020-03-11 ENCOUNTER — Other Ambulatory Visit: Payer: Self-pay | Admitting: Cardiology

## 2020-03-17 ENCOUNTER — Other Ambulatory Visit: Payer: Self-pay

## 2020-03-17 ENCOUNTER — Ambulatory Visit (INDEPENDENT_AMBULATORY_CARE_PROVIDER_SITE_OTHER): Payer: PPO | Admitting: *Deleted

## 2020-03-17 DIAGNOSIS — Z5181 Encounter for therapeutic drug level monitoring: Secondary | ICD-10-CM

## 2020-03-17 DIAGNOSIS — I482 Chronic atrial fibrillation, unspecified: Secondary | ICD-10-CM | POA: Diagnosis not present

## 2020-03-17 LAB — POCT INR: INR: 3.6 — AB (ref 2.0–3.0)

## 2020-03-17 NOTE — Patient Instructions (Signed)
Hold warfarin tomorrow then resume 1/2 tablet daily except 1 tablet on Tuesdays. Recheck 6 weeks.

## 2020-04-14 DIAGNOSIS — I776 Arteritis, unspecified: Secondary | ICD-10-CM | POA: Diagnosis not present

## 2020-04-28 ENCOUNTER — Ambulatory Visit (INDEPENDENT_AMBULATORY_CARE_PROVIDER_SITE_OTHER): Payer: PPO | Admitting: Pharmacist

## 2020-04-28 ENCOUNTER — Other Ambulatory Visit: Payer: Self-pay

## 2020-04-28 DIAGNOSIS — Z7901 Long term (current) use of anticoagulants: Secondary | ICD-10-CM | POA: Diagnosis not present

## 2020-04-28 DIAGNOSIS — Z5181 Encounter for therapeutic drug level monitoring: Secondary | ICD-10-CM

## 2020-04-28 DIAGNOSIS — I482 Chronic atrial fibrillation, unspecified: Secondary | ICD-10-CM

## 2020-04-28 LAB — POCT INR: INR: 3.3 — AB (ref 2.0–3.0)

## 2020-04-28 NOTE — Patient Instructions (Signed)
Description   Skip your Coumadin tomorrow, then continue 1/2 tablet daily except 1 tablet on Tuesdays. Go back to eating your normal intake of greens Recheck in 4 weeks.

## 2020-05-26 ENCOUNTER — Ambulatory Visit (INDEPENDENT_AMBULATORY_CARE_PROVIDER_SITE_OTHER): Payer: PPO | Admitting: *Deleted

## 2020-05-26 DIAGNOSIS — Z5181 Encounter for therapeutic drug level monitoring: Secondary | ICD-10-CM

## 2020-05-26 DIAGNOSIS — I482 Chronic atrial fibrillation, unspecified: Secondary | ICD-10-CM

## 2020-05-26 LAB — POCT INR: INR: 2.9 (ref 2.0–3.0)

## 2020-05-26 NOTE — Patient Instructions (Signed)
Continue 1/2 tablet daily except 1 tablet on Tuesdays. Continue eating your normal intake of greens Recheck in 5 weeks.

## 2020-06-08 ENCOUNTER — Other Ambulatory Visit: Payer: Self-pay | Admitting: Cardiovascular Disease

## 2020-06-08 DIAGNOSIS — Z5181 Encounter for therapeutic drug level monitoring: Secondary | ICD-10-CM

## 2020-06-08 DIAGNOSIS — I482 Chronic atrial fibrillation, unspecified: Secondary | ICD-10-CM

## 2020-06-30 ENCOUNTER — Ambulatory Visit (INDEPENDENT_AMBULATORY_CARE_PROVIDER_SITE_OTHER): Payer: PPO | Admitting: *Deleted

## 2020-06-30 DIAGNOSIS — I482 Chronic atrial fibrillation, unspecified: Secondary | ICD-10-CM

## 2020-06-30 DIAGNOSIS — Z5181 Encounter for therapeutic drug level monitoring: Secondary | ICD-10-CM | POA: Diagnosis not present

## 2020-06-30 LAB — POCT INR: INR: 2.2 (ref 2.0–3.0)

## 2020-06-30 NOTE — Patient Instructions (Signed)
Continue 1/2 tablet daily except 1 tablet on Tuesdays. Continue eating your normal intake of greens Recheck in 6 weeks. 

## 2020-08-03 DIAGNOSIS — N183 Chronic kidney disease, stage 3 unspecified: Secondary | ICD-10-CM | POA: Diagnosis not present

## 2020-08-03 DIAGNOSIS — I482 Chronic atrial fibrillation, unspecified: Secondary | ICD-10-CM | POA: Diagnosis not present

## 2020-08-11 ENCOUNTER — Other Ambulatory Visit: Payer: Self-pay

## 2020-08-11 ENCOUNTER — Ambulatory Visit (INDEPENDENT_AMBULATORY_CARE_PROVIDER_SITE_OTHER): Payer: PPO | Admitting: Pharmacist

## 2020-08-11 DIAGNOSIS — Z5181 Encounter for therapeutic drug level monitoring: Secondary | ICD-10-CM | POA: Diagnosis not present

## 2020-08-11 DIAGNOSIS — Z7901 Long term (current) use of anticoagulants: Secondary | ICD-10-CM

## 2020-08-11 DIAGNOSIS — I482 Chronic atrial fibrillation, unspecified: Secondary | ICD-10-CM

## 2020-08-11 LAB — POCT INR: INR: 2.7 (ref 2.0–3.0)

## 2020-08-11 NOTE — Patient Instructions (Signed)
Description   Continue 1/2 tablet daily except 1 tablet on Tuesdays. Continue eating your normal intake of greens Recheck in 6 weeks.

## 2020-08-25 DIAGNOSIS — Z7689 Persons encountering health services in other specified circumstances: Secondary | ICD-10-CM | POA: Diagnosis not present

## 2020-08-25 DIAGNOSIS — I776 Arteritis, unspecified: Secondary | ICD-10-CM | POA: Diagnosis not present

## 2020-09-05 ENCOUNTER — Other Ambulatory Visit: Payer: Self-pay | Admitting: Cardiology

## 2020-09-16 DIAGNOSIS — Z23 Encounter for immunization: Secondary | ICD-10-CM | POA: Diagnosis not present

## 2020-09-22 ENCOUNTER — Ambulatory Visit (INDEPENDENT_AMBULATORY_CARE_PROVIDER_SITE_OTHER): Payer: PPO | Admitting: *Deleted

## 2020-09-22 ENCOUNTER — Other Ambulatory Visit: Payer: Self-pay

## 2020-09-22 DIAGNOSIS — Z5181 Encounter for therapeutic drug level monitoring: Secondary | ICD-10-CM | POA: Diagnosis not present

## 2020-09-22 DIAGNOSIS — I482 Chronic atrial fibrillation, unspecified: Secondary | ICD-10-CM | POA: Diagnosis not present

## 2020-09-22 LAB — POCT INR: INR: 2.9 (ref 2.0–3.0)

## 2020-09-22 NOTE — Patient Instructions (Signed)
Continue 1/2 tablet daily except 1 tablet on Tuesdays. Continue eating your normal intake of greens Recheck in 6 weeks.

## 2020-10-31 ENCOUNTER — Telehealth: Payer: Self-pay | Admitting: *Deleted

## 2020-10-31 NOTE — Telephone Encounter (Signed)
father in law has been placed with Hospice -will contact our office when able to return.

## 2020-12-09 ENCOUNTER — Other Ambulatory Visit: Payer: Self-pay | Admitting: Cardiology

## 2020-12-16 ENCOUNTER — Other Ambulatory Visit: Payer: Self-pay | Admitting: Cardiology

## 2020-12-16 DIAGNOSIS — Z5181 Encounter for therapeutic drug level monitoring: Secondary | ICD-10-CM

## 2020-12-16 DIAGNOSIS — I482 Chronic atrial fibrillation, unspecified: Secondary | ICD-10-CM

## 2020-12-16 NOTE — Telephone Encounter (Addendum)
Pt is overdue for Anticoagulation Appt. Called pt and spoke with wife and she set an appt for the pt and is aware that I can sent in a few tabs until appt, which she set for Monday at 1115am and wife states she will ensure the pt is aware. Also, pt has not seen a Cardiologist since 06/11/2019, so he needs an appt for [place] to continue to manage. Placed a note on 12/19/20 appt for Lattie Haw to have pt set an appt with Cardiologist.

## 2020-12-19 ENCOUNTER — Telehealth: Payer: Self-pay | Admitting: *Deleted

## 2020-12-19 NOTE — Telephone Encounter (Signed)
Spoke with wife.  Pt was up all night with her father who is 66 and dying.  Appt rescheduled for next week.

## 2020-12-19 NOTE — Telephone Encounter (Signed)
Patient called stating that he has been up all night with his father in law. He needs to cancel appointment for today but wants to speak with Edrick Oh, RN.

## 2020-12-29 ENCOUNTER — Ambulatory Visit (INDEPENDENT_AMBULATORY_CARE_PROVIDER_SITE_OTHER): Payer: PPO | Admitting: *Deleted

## 2020-12-29 DIAGNOSIS — I482 Chronic atrial fibrillation, unspecified: Secondary | ICD-10-CM

## 2020-12-29 DIAGNOSIS — Z5181 Encounter for therapeutic drug level monitoring: Secondary | ICD-10-CM

## 2020-12-29 LAB — POCT INR: INR: 3.6 — AB (ref 2.0–3.0)

## 2020-12-29 NOTE — Patient Instructions (Signed)
Hold warfarin tomorrow then resume 1/2 tablet daily except 1 tablet on Tuesdays. Continue eating your normal intake of greens Recheck in 4 weeks.

## 2020-12-30 ENCOUNTER — Ambulatory Visit: Payer: PPO | Admitting: Cardiology

## 2021-01-14 ENCOUNTER — Other Ambulatory Visit: Payer: Self-pay | Admitting: Cardiology

## 2021-01-14 DIAGNOSIS — I482 Chronic atrial fibrillation, unspecified: Secondary | ICD-10-CM

## 2021-01-14 DIAGNOSIS — Z5181 Encounter for therapeutic drug level monitoring: Secondary | ICD-10-CM

## 2021-01-25 ENCOUNTER — Ambulatory Visit (INDEPENDENT_AMBULATORY_CARE_PROVIDER_SITE_OTHER): Payer: PPO | Admitting: *Deleted

## 2021-01-25 DIAGNOSIS — I482 Chronic atrial fibrillation, unspecified: Secondary | ICD-10-CM | POA: Diagnosis not present

## 2021-01-25 DIAGNOSIS — Z5181 Encounter for therapeutic drug level monitoring: Secondary | ICD-10-CM

## 2021-01-25 LAB — POCT INR: INR: 2.7 (ref 2.0–3.0)

## 2021-01-25 NOTE — Patient Instructions (Signed)
Continue warfarin 1/2 tablet daily except 1 tablet on Tuesdays. Continue eating your normal intake of greens Recheck in 4 weeks.

## 2021-02-03 DIAGNOSIS — I482 Chronic atrial fibrillation, unspecified: Secondary | ICD-10-CM | POA: Diagnosis not present

## 2021-02-03 DIAGNOSIS — E785 Hyperlipidemia, unspecified: Secondary | ICD-10-CM | POA: Diagnosis not present

## 2021-02-03 DIAGNOSIS — N1832 Chronic kidney disease, stage 3b: Secondary | ICD-10-CM | POA: Diagnosis not present

## 2021-02-03 DIAGNOSIS — L959 Vasculitis limited to the skin, unspecified: Secondary | ICD-10-CM | POA: Diagnosis not present

## 2021-02-22 ENCOUNTER — Ambulatory Visit (INDEPENDENT_AMBULATORY_CARE_PROVIDER_SITE_OTHER): Payer: PPO | Admitting: *Deleted

## 2021-02-22 DIAGNOSIS — I482 Chronic atrial fibrillation, unspecified: Secondary | ICD-10-CM | POA: Diagnosis not present

## 2021-02-22 DIAGNOSIS — Z5181 Encounter for therapeutic drug level monitoring: Secondary | ICD-10-CM | POA: Diagnosis not present

## 2021-02-22 LAB — POCT INR: INR: 2.2 (ref 2.0–3.0)

## 2021-02-22 NOTE — Patient Instructions (Signed)
Continue warfarin 1/2 tablet daily except 1 tablet on Tuesdays. Continue eating your normal intake of greens Recheck in 4 weeks.

## 2021-02-24 ENCOUNTER — Other Ambulatory Visit: Payer: Self-pay

## 2021-02-24 ENCOUNTER — Ambulatory Visit
Admission: EM | Admit: 2021-02-24 | Discharge: 2021-02-24 | Disposition: A | Discharge status: Home / Self Care | Payer: PPO | Attending: Emergency Medicine | Admitting: Emergency Medicine

## 2021-02-24 ENCOUNTER — Ambulatory Visit: Payer: PPO

## 2021-02-24 ENCOUNTER — Encounter: Payer: Self-pay | Admitting: Emergency Medicine

## 2021-02-24 ENCOUNTER — Ambulatory Visit (INDEPENDENT_AMBULATORY_CARE_PROVIDER_SITE_OTHER): Payer: PPO

## 2021-02-24 DIAGNOSIS — M79671 Pain in right foot: Secondary | ICD-10-CM | POA: Diagnosis not present

## 2021-02-24 DIAGNOSIS — M19071 Primary osteoarthritis, right ankle and foot: Secondary | ICD-10-CM | POA: Diagnosis not present

## 2021-02-24 HISTORY — DX: Gout, unspecified: M10.9

## 2021-02-24 MED ORDER — PREDNISONE 10 MG (21) PO TBPK: ORAL_TABLET | Freq: Every day | ORAL | 0 refills | Status: DC

## 2021-02-24 MED ORDER — DEXAMETHASONE SODIUM PHOSPHATE 10 MG/ML IJ SOLN
10.0000 mg | Freq: Once | INTRAMUSCULAR | Status: AC
  Administered 2021-02-24: 10 mg via INTRAMUSCULAR

## 2021-02-24 NOTE — Discharge Instructions (Signed)
X-rays negative for fracture or dislocation Continue conservative management of rest, ice, and elevation Steroid shot given in office Prednisone prescribed.  Take as directed and to completion Follow up with PCP if symptoms persist Return or go to the ER if you have any new or worsening symptoms (fever, chills, chest pain, redness, swelling, bruising, etc...)

## 2021-02-24 NOTE — ED Provider Notes (Signed)
[place]   [id number] 02/24/21 Arrival Time: 0160  CC: RT foot pain and injury  SUBJECTIVE: History from: patient. Dillon Strickland is a 70 y.o. male complains of RT foot pain x 5 days.  Denies a precipitating event or specific injury, however, symptoms began after tilling.  Localizes the pain to the top of foot.  Describes the pain as intermittent and sharp in character.  Has tried OTC medications without relief.  Symptoms are made worse with weight-bearing.  Reports similar symptoms in the past with gout flare.  Currently on prednisone with little relief.  Denies fever, chills, erythema, ecchymosis, effusion, weakness, numbness and tingling.      ROS: As per HPI.  All other pertinent ROS negative.     Past Medical History:  Diagnosis Date  . Chronic anticoagulation    Colonoscopy with polypectomy in 2006; due for a repeat procedure as of 2012  . Chronic atrial fibrillation ([place]) 2001   Spontaneous contrast on TEE  . Degenerative joint disease of spine    Mild; cervical and lumbosacral  . Gout   . Hyperlipidemia   . Overweight(278.02)   . Pseudogout    Past Surgical History:  Procedure Laterality Date  . COLONOSCOPY N/A 04/23/2013   Procedure: COLONOSCOPY;  Surgeon: Rogene Houston, MD;  [facility];  Service: Endoscopy;  Laterality: N/A;  1030-rescheduled to [place] notified pt  . COLONOSCOPY W/ POLYPECTOMY  2006   Allergies  Allergen Reactions  . Uloric [Febuxostat] Rash   No current facility-administered medications on file prior to encounter.   Current Outpatient Medications on File Prior to Encounter  Medication Sig Dispense Refill  . allopurinol (ZYLOPRIM) 100 MG tablet Take 300 mg by mouth.    Marland Kitchen atenolol (TENORMIN) 50 MG tablet TAKE ONE TABLET BY MOUTH DAILY. 90 tablet 2  . atorvastatin (LIPITOR) 20 MG tablet Take 20 mg by mouth daily.    Marland Kitchen diltiazem (CARTIA XT) 240 MG 24 hr capsule Take 1 capsule (240 mg total) by mouth daily. 30 capsule 1   . escitalopram (LEXAPRO) 10 MG tablet Take 10 mg by mouth daily.    Marland Kitchen warfarin (COUMADIN) 5 MG tablet TAKE 1/2 TABLET BY MOUTH DAILY EXCEPT 1 TAB ON TUESDAYS AS DIRECTED BY COUMADIN CLINIC. 30 tablet 2   Social History   Socioeconomic History  . Marital status: Married    Spouse name: Not on file  . Number of children: Not on file  . Years of education: Not on file  . Highest education level: Not on file  Occupational History  . Occupation: Full time    Employer: RETIRED  Tobacco Use  . Smoking status: Never Smoker  . Smokeless tobacco: Never Used  Vaping Use  . Vaping Use: Never used  Substance and Sexual Activity  . Alcohol use: No    Comment: occasional beer, glass of wine/month - denies at this time 05/18/14  . Drug use: No  . Sexual activity: Never  Other Topics Concern  . Not on file  Social History Narrative   Married   Exercises regularly   Social Determinants of Health   Financial Resource Strain: Not on file  Food Insecurity: Not on file  Transportation Needs: Not on file  Physical Activity: Not on file  Stress: Not on file  Social Connections: Not on file  Intimate Partner Violence: Not on file   Family History  Problem Relation Age of Onset  . Stroke Mother   . Cancer  Mother   . Heart attack Father   . Arrhythmia Sister        Atrial fibrillation  . Arrhythmia Brother   . Arrhythmia Brother   . Diabetes Other   . Heart attack Other   . Colon cancer Neg Hx     OBJECTIVE:  Vitals:   02/24/21 1005  BP: 119/76  Pulse: 86  Resp: 20  Temp: 98.1 F (36.7 C)  TempSrc: Oral  SpO2: 96%    General appearance: ALERT; in no acute distress.  Head: NCAT Lungs: Normal respiratory effort CV: Dorsalis pedis pulse 2+; cap refill < 2 seconds Musculoskeletal: RT foot Inspection: diffuse swelling about the foot Palpation: diffusely TTP over 3-5 proximal MTs ROM: FROM active and passive Strength: deferred Skin: warm and dry Neurologic: Ambulates with  minimal difficulty Psychological: alert and cooperative; normal mood and affect  DIAGNOSTIC STUDIES:  DG Foot Complete Right  Result Date: 02/24/2021 CLINICAL DATA:  Right foot pain EXAM: RIGHT FOOT COMPLETE - 3+ VIEW COMPARISON:  09/30/2018 FINDINGS: Negative for fracture. Mild degenerative change first MTP joint. Mild degenerative change in the midfoot. Arterial calcification. IMPRESSION: Negative for fracture. Degenerative change in the first MTP joint and in the midfoot Electronically Signed   By: Franchot Gallo M.D.   On: 02/24/2021 10:31    X-rays negative for bony abnormalities including fracture, or dislocation.  No soft tissue swelling.    I have reviewed the x-rays myself and the radiologist interpretation. I am in agreement with the radiologist interpretation.     ASSESSMENT & PLAN:  1. Right foot pain     Meds ordered this encounter  Medications  . dexamethasone (DECADRON) injection 10 mg  . predniSONE (STERAPRED UNI-PAK 21 TAB) 10 MG (21) TBPK tablet    Sig: Take by mouth daily. Take 6 tabs by mouth daily  for 2 days, then 5 tabs for 2 days, then 4 tabs for 2 days, then 3 tabs for 2 days, 2 tabs for 2 days, then 1 tab by mouth daily for 2 days    Dispense:  42 tablet    Refill:  0    Order Specific Question:   Supervising Provider    Answer:   Raylene Everts [[id number]]   X-rays negative for fracture or dislocation Continue conservative management of rest, ice, and elevation Steroid shot given in office Prednisone prescribed.  Take as directed and to completion Follow up with PCP if symptoms persist Return or go to the ER if you have any new or worsening symptoms (fever, chills, chest pain, redness, swelling, bruising, etc...)   Reviewed expectations re: course of current medical issues. Questions answered. Outlined signs and symptoms indicating need for more acute intervention. Patient verbalized understanding. After Visit Summary given.    Lestine Box,  PA-C 02/24/21 1046

## 2021-02-24 NOTE — ED Triage Notes (Signed)
RT foot pain that goes up to his knee for a week after he twisted his leg tilling.   Pt is currently being tx for gout in LT hand with prednisone from his pcp.

## 2021-02-27 DIAGNOSIS — M109 Gout, unspecified: Secondary | ICD-10-CM | POA: Diagnosis not present

## 2021-03-08 ENCOUNTER — Ambulatory Visit (INDEPENDENT_AMBULATORY_CARE_PROVIDER_SITE_OTHER): Payer: PPO | Admitting: Cardiology

## 2021-03-08 ENCOUNTER — Encounter: Payer: Self-pay | Admitting: *Deleted

## 2021-03-08 ENCOUNTER — Encounter: Payer: Self-pay | Admitting: Cardiology

## 2021-03-08 VITALS — BP 124/80 | HR 72 | Ht 67.5 in | Wt 235.2 lb

## 2021-03-08 DIAGNOSIS — E782 Mixed hyperlipidemia: Secondary | ICD-10-CM

## 2021-03-08 DIAGNOSIS — I776 Arteritis, unspecified: Secondary | ICD-10-CM | POA: Diagnosis not present

## 2021-03-08 DIAGNOSIS — I482 Chronic atrial fibrillation, unspecified: Secondary | ICD-10-CM | POA: Diagnosis not present

## 2021-03-08 MED ORDER — SILDENAFIL CITRATE 20 MG PO TABS: ORAL_TABLET | ORAL | 1 refills | Status: DC

## 2021-03-08 NOTE — Progress Notes (Signed)
Clinical Summary Mr. Wynia is a 70 y.o.male seen today for follow up of the following medical problems.   1. Chronic afib - a trial of eliquis caused some fatigue per his report, he is happy with coumadin.    - no recent palpitations - no bleeding on coumadin.   2. Hyperlipidemia - labs followed by pcp - he is compliant with statin   3. Vasculitis - followed at [facility] - ANCA + vasculitis. Had been on methotrexate, now off     Past Medical History:  Diagnosis Date  . Chronic anticoagulation    Colonoscopy with polypectomy in 2006; due for a repeat procedure as of 2012  . Chronic atrial fibrillation ([place]) 2001   Spontaneous contrast on TEE  . Degenerative joint disease of spine    Mild; cervical and lumbosacral  . Gout   . Hyperlipidemia   . Overweight(278.02)   . Pseudogout      Allergies  Allergen Reactions  . Uloric [Febuxostat] Rash     Current Outpatient Medications  Medication Sig Dispense Refill  . allopurinol (ZYLOPRIM) 100 MG tablet Take 300 mg by mouth.    Marland Kitchen atenolol (TENORMIN) 50 MG tablet TAKE ONE TABLET BY MOUTH DAILY. 90 tablet 2  . atorvastatin (LIPITOR) 20 MG tablet Take 20 mg by mouth daily.    Marland Kitchen diltiazem (CARTIA XT) 240 MG 24 hr capsule Take 1 capsule (240 mg total) by mouth daily. 30 capsule 1  . escitalopram (LEXAPRO) 10 MG tablet Take 10 mg by mouth daily.    . predniSONE (STERAPRED UNI-PAK 21 TAB) 10 MG (21) TBPK tablet Take by mouth daily. Take 6 tabs by mouth daily  for 2 days, then 5 tabs for 2 days, then 4 tabs for 2 days, then 3 tabs for 2 days, 2 tabs for 2 days, then 1 tab by mouth daily for 2 days 42 tablet 0  . warfarin (COUMADIN) 5 MG tablet TAKE 1/2 TABLET BY MOUTH DAILY EXCEPT 1 TAB ON TUESDAYS AS DIRECTED BY COUMADIN CLINIC. 30 tablet 2   No current facility-administered medications for this visit.     Past Surgical History:  Procedure Laterality Date  . COLONOSCOPY N/A 04/23/2013    Procedure: COLONOSCOPY;  Surgeon: Rogene Houston, MD;  [facility];  Service: Endoscopy;  Laterality: N/A;  1030-rescheduled to [place] notified pt  . COLONOSCOPY W/ POLYPECTOMY  2006     Allergies  Allergen Reactions  . Uloric [Febuxostat] Rash      Family History  Problem Relation Age of Onset  . Stroke Mother   . Cancer Mother   . Heart attack Father   . Arrhythmia Sister        Atrial fibrillation  . Arrhythmia Brother   . Arrhythmia Brother   . Diabetes Other   . Heart attack Other   . Colon cancer Neg Hx      Social History Mr. Muscatello reports that he has never smoked. He has never used smokeless tobacco. Mr. Kazmierczak reports no history of alcohol use.   Review of Systems CONSTITUTIONAL: No weight loss, fever, chills, weakness or fatigue.  HEENT: Eyes: No visual loss, blurred vision, double vision or yellow sclerae.No hearing loss, sneezing, congestion, runny nose or sore throat.  SKIN: No rash or itching.  CARDIOVASCULAR: per hpi RESPIRATORY: No shortness of breath, cough or sputum.  GASTROINTESTINAL: No anorexia, nausea, vomiting or diarrhea. No abdominal pain or blood.  GENITOURINARY: No burning on urination,  no polyuria NEUROLOGICAL: No headache, dizziness, syncope, paralysis, ataxia, numbness or tingling in the extremities. No change in bowel or bladder control.  MUSCULOSKELETAL: No muscle, back pain, joint pain or stiffness.  LYMPHATICS: No enlarged nodes. No history of splenectomy.  PSYCHIATRIC: No history of depression or anxiety.  ENDOCRINOLOGIC: No reports of sweating, cold or heat intolerance. No polyuria or polydipsia.  Marland Kitchen   Physical Examination Today's Vitals   03/08/21 1038  BP: 124/80  Pulse: 72  Weight: 235 lb 3.2 oz (106.7 kg)  Height: 5' 7.5" (1.715 m)   Body mass index is 36.29 kg/m.  Gen: resting comfortably, no acute distress HEENT: no scleral icterus, pupils equal round and reactive, no palptable cervical adenopathy,   CV: irreg, no m/r/g, no jvd Resp: Clear to auscultation bilaterally GI: abdomen is soft, non-tender, non-distended, normal bowel sounds, no hepatosplenomegaly MSK: extremities are warm, no edema.  Skin: warm, no rash Neuro:  no focal deficits Psych: appropriate affect     Assessment and Plan   1. Afib  denies any symptoms - has not had interest in NOACs, continue coumadin - EKG today shows rate controlled afib  2. Hyperlipidemia -request pcp labs, continue statin     Arnoldo Lenis, M.D.

## 2021-03-08 NOTE — Patient Instructions (Signed)
Your physician recommends that you schedule a follow-up appointment in: [place] has recommended you make the following change in your medication:   TAKE SILDENAFIL 20 MG - TAKE 3-5 TABLETS 30 MINS PRIOR TO INTERCOURSE   Thank you for choosing [facility]!!

## 2021-03-22 ENCOUNTER — Other Ambulatory Visit: Payer: Self-pay

## 2021-03-22 ENCOUNTER — Ambulatory Visit (INDEPENDENT_AMBULATORY_CARE_PROVIDER_SITE_OTHER): Payer: PPO | Admitting: *Deleted

## 2021-03-22 DIAGNOSIS — Z5181 Encounter for therapeutic drug level monitoring: Secondary | ICD-10-CM | POA: Diagnosis not present

## 2021-03-22 DIAGNOSIS — I482 Chronic atrial fibrillation, unspecified: Secondary | ICD-10-CM | POA: Diagnosis not present

## 2021-03-22 LAB — POCT INR: INR: 3.1 — AB (ref 2.0–3.0)

## 2021-03-22 NOTE — Patient Instructions (Signed)
Continue warfarin 1/2 tablet daily except 1 tablet on Tuesdays. Continue eating your normal intake of greens Recheck in 6 weeks.

## 2021-05-04 ENCOUNTER — Ambulatory Visit
Admission: EM | Admit: 2021-05-04 | Discharge: 2021-05-04 | Disposition: A | Discharge status: Home / Self Care | Payer: PPO | Attending: Emergency Medicine | Admitting: Emergency Medicine

## 2021-05-04 ENCOUNTER — Other Ambulatory Visit: Payer: Self-pay

## 2021-05-04 DIAGNOSIS — N3001 Acute cystitis with hematuria: Secondary | ICD-10-CM | POA: Insufficient documentation

## 2021-05-04 LAB — POCT URINALYSIS DIP (MANUAL ENTRY)
Bilirubin, UA: NEGATIVE
Glucose, UA: NEGATIVE mg/dL
Ketones, POC UA: NEGATIVE mg/dL
Nitrite, UA: NEGATIVE
Protein Ur, POC: 30 mg/dL — AB
Spec Grav, UA: >=1.03 — AB (ref 1.010–1.025)
Urobilinogen, UA: 0.2 E.U./dL
pH, UA: 5.5 (ref 5.0–8.0)

## 2021-05-04 MED ORDER — CEPHALEXIN 500 MG PO CAPS: 500.0000 mg | ORAL_CAPSULE | Freq: Two times a day (BID) | ORAL | 0 refills | Status: AC

## 2021-05-04 NOTE — ED Provider Notes (Signed)
[facility]   CC: Burning with urination  SUBJECTIVE:  Dillon Strickland is a 70 y.o. male who complains of RT lower back pain, dysuria, difficulties with voiding, and increased frequency x few days.  Denies precipitating event or trauma.  Hx significant for kidney disease.  Localizes the pain to the RT low back.  Pain is constant and describes it as dull.  Has NOT tried OTC medications.  Symptoms are made worse with urination.  Denies fever, chills, nausea, vomiting, abdominal pain.  LMP: No LMP for male patient.  ROS: As in HPI.  All other pertinent ROS negative.     Past Medical History:  Diagnosis Date  . Chronic anticoagulation    Colonoscopy with polypectomy in 2006; due for a repeat procedure as of 2012  . Chronic atrial fibrillation ([place]) 2001   Spontaneous contrast on TEE  . Degenerative joint disease of spine    Mild; cervical and lumbosacral  . Gout   . Hyperlipidemia   . Overweight(278.02)   . Pseudogout    Past Surgical History:  Procedure Laterality Date  . COLONOSCOPY N/A 04/23/2013   Procedure: COLONOSCOPY;  Surgeon: Rogene Houston, MD;  Location[facility];  Service: Endoscopy;  Laterality: N/A;  1030-rescheduled to [place] notified pt  . COLONOSCOPY W/ POLYPECTOMY  2006   Allergies  Allergen Reactions  . Uloric [Febuxostat] Rash   No current facility-administered medications on file prior to encounter.   Current Outpatient Medications on File Prior to Encounter  Medication Sig Dispense Refill  . allopurinol (ZYLOPRIM) 100 MG tablet Take 300 mg by mouth.    Marland Kitchen atenolol (TENORMIN) 50 MG tablet TAKE ONE TABLET BY MOUTH DAILY. 90 tablet 2  . atorvastatin (LIPITOR) 20 MG tablet Take 20 mg by mouth daily.    Marland Kitchen diltiazem (CARTIA XT) 240 MG 24 hr capsule Take 1 capsule (240 mg total) by mouth daily. 30 capsule 1  . escitalopram (LEXAPRO) 10 MG tablet Take 10 mg by mouth daily.    . sildenafil (REVATIO) 20 MG tablet TAKE 3-5 TABLETS 30 MINS PRIOR  TO INTERCOURSE 30 tablet 1  . warfarin (COUMADIN) 5 MG tablet TAKE 1/2 TABLET BY MOUTH DAILY EXCEPT 1 TAB ON TUESDAYS AS DIRECTED BY COUMADIN CLINIC. 30 tablet 2   Social History   Socioeconomic History  . Marital status: Married    Spouse name: Not on file  . Number of children: Not on file  . Years of education: Not on file  . Highest education level: Not on file  Occupational History  . Occupation: Full time    Employer: RETIRED  Tobacco Use  . Smoking status: Never Smoker  . Smokeless tobacco: Never Used  Vaping Use  . Vaping Use: Never used  Substance and Sexual Activity  . Alcohol use: No    Comment: occasional beer, glass of wine/month - denies at this time 05/18/14  . Drug use: No  . Sexual activity: Never  Other Topics Concern  . Not on file  Social History Narrative   Married   Exercises regularly   Social Determinants of Health   Financial Resource Strain: Not on file  Food Insecurity: Not on file  Transportation Needs: Not on file  Physical Activity: Not on file  Stress: Not on file  Social Connections: Not on file  Intimate Partner Violence: Not on file   Family History  Problem Relation Age of Onset  . Stroke Mother   . Cancer Mother   .  Heart attack Father   . Arrhythmia Sister        Atrial fibrillation  . Arrhythmia Brother   . Arrhythmia Brother   . Diabetes Other   . Heart attack Other   . Colon cancer Neg Hx     OBJECTIVE:  Vitals:   05/04/21 0959  BP: 131/73  Pulse: 75  Resp: 16  Temp: 98.1 F (36.7 C)  TempSrc: Oral  SpO2: 96%   General appearance: AOx3 in no acute distress HEENT: NCAT.  Oropharynx clear.  Lungs: clear to auscultation bilaterally without adventitious breath sounds Heart: regular rate and rhythm.   Abdomen: soft; non-distended; no tenderness; bowel sounds present; no guarding Back: no CVA tenderness Extremities: no edema; symmetrical with no gross deformities Skin: warm and dry Neurologic: Ambulates from  chair to exam table without difficulty Psychological: alert and cooperative; normal mood and affect  Labs Reviewed  POCT URINALYSIS DIP (MANUAL ENTRY) - Abnormal; Notable for the following components:      Result Value   Clarity, UA hazy (*)    Spec Grav, UA >=1.030 (*)    Blood, UA moderate (*)    Protein Ur, POC =30 (*)    Leukocytes, UA Trace (*)    All other components within normal limits  URINE CULTURE    ASSESSMENT & PLAN:  1. Acute cystitis with hematuria     Meds ordered this encounter  Medications  . cephALEXin (KEFLEX) 500 MG capsule    Sig: Take 1 capsule (500 mg total) by mouth 2 (two) times daily for 10 days.    Dispense:  20 capsule    Refill:  0    Order Specific Question:   Supervising Provider    Answer:   Raylene Everts [[id number]]   Urine concerning for infection Urine culture sent.  We will call you with the results.   Push fluids and get plenty of rest.   Take antibiotic as directed and to completion Follow up with PCP for recheck Return here or go to ER if you have any new or worsening symptoms such as fever, abdominal pain, nausea/vomiting, flank pain, etc...  Outlined signs and symptoms indicating need for more acute intervention. Patient verbalized understanding. After Visit Summary given.     Lestine Box, PA-C 05/04/21 1053

## 2021-05-04 NOTE — Discharge Instructions (Addendum)
Urine concerning for infection Urine culture sent.  We will call you with the results.   Push fluids and get plenty of rest.   Take antibiotic as directed and to completion Follow up with PCP for recheck Return here or go to ER if you have any new or worsening symptoms such as fever, abdominal pain, nausea/vomiting, flank pain, etc..Marland Kitchen

## 2021-05-04 NOTE — ED Triage Notes (Addendum)
Patient presents to Urgent Care with complaints of right lower back pain, dysuria, has difficulties voiding, increased freq. Pt states he had creatine lab workup recently but unsure of results.   Denies fever, abdominal pain.

## 2021-05-06 LAB — URINE CULTURE: Culture: 60000 — AB

## 2021-05-15 ENCOUNTER — Ambulatory Visit (INDEPENDENT_AMBULATORY_CARE_PROVIDER_SITE_OTHER): Payer: PPO | Admitting: *Deleted

## 2021-05-15 DIAGNOSIS — Z5181 Encounter for therapeutic drug level monitoring: Secondary | ICD-10-CM | POA: Diagnosis not present

## 2021-05-15 DIAGNOSIS — I482 Chronic atrial fibrillation, unspecified: Secondary | ICD-10-CM

## 2021-05-15 LAB — POCT INR: INR: 2.6 (ref 2.0–3.0)

## 2021-05-15 NOTE — Patient Instructions (Signed)
Continue warfarin 1/2 tablet daily except 1 tablet on Tuesdays. Continue eating your normal intake of greens Recheck in 6 weeks. Is taking prednisone 20mg  daily for gout. Plans to take tomorrow then stop.

## 2021-05-29 DIAGNOSIS — N390 Urinary tract infection, site not specified: Secondary | ICD-10-CM | POA: Diagnosis not present

## 2021-06-01 DIAGNOSIS — N3000 Acute cystitis without hematuria: Secondary | ICD-10-CM | POA: Diagnosis not present

## 2021-06-26 ENCOUNTER — Ambulatory Visit (INDEPENDENT_AMBULATORY_CARE_PROVIDER_SITE_OTHER): Payer: PPO | Admitting: *Deleted

## 2021-06-26 ENCOUNTER — Other Ambulatory Visit: Payer: Self-pay

## 2021-06-26 DIAGNOSIS — Z5181 Encounter for therapeutic drug level monitoring: Secondary | ICD-10-CM | POA: Diagnosis not present

## 2021-06-26 DIAGNOSIS — I482 Chronic atrial fibrillation, unspecified: Secondary | ICD-10-CM

## 2021-06-26 LAB — POCT INR: INR: 2.9 (ref 2.0–3.0)

## 2021-06-26 NOTE — Patient Instructions (Signed)
Continue warfarin 1/2 tablet daily except 1 tablet on Tuesdays. Continue eating your normal intake of greens Recheck in 6 weeks.

## 2021-07-03 DIAGNOSIS — N1832 Chronic kidney disease, stage 3b: Secondary | ICD-10-CM | POA: Diagnosis not present

## 2021-07-03 DIAGNOSIS — I129 Hypertensive chronic kidney disease with stage 1 through stage 4 chronic kidney disease, or unspecified chronic kidney disease: Secondary | ICD-10-CM | POA: Diagnosis not present

## 2021-07-03 DIAGNOSIS — R809 Proteinuria, unspecified: Secondary | ICD-10-CM | POA: Diagnosis not present

## 2021-07-03 DIAGNOSIS — I776 Arteritis, unspecified: Secondary | ICD-10-CM | POA: Diagnosis not present

## 2021-07-03 DIAGNOSIS — D631 Anemia in chronic kidney disease: Secondary | ICD-10-CM | POA: Diagnosis not present

## 2021-07-03 DIAGNOSIS — Z79899 Other long term (current) drug therapy: Secondary | ICD-10-CM | POA: Diagnosis not present

## 2021-07-07 DIAGNOSIS — E875 Hyperkalemia: Secondary | ICD-10-CM | POA: Diagnosis not present

## 2021-07-07 DIAGNOSIS — R809 Proteinuria, unspecified: Secondary | ICD-10-CM | POA: Diagnosis not present

## 2021-07-07 DIAGNOSIS — I129 Hypertensive chronic kidney disease with stage 1 through stage 4 chronic kidney disease, or unspecified chronic kidney disease: Secondary | ICD-10-CM | POA: Diagnosis not present

## 2021-07-07 DIAGNOSIS — N038 Chronic nephritic syndrome with other morphologic changes: Secondary | ICD-10-CM | POA: Diagnosis not present

## 2021-07-07 DIAGNOSIS — N1831 Chronic kidney disease, stage 3a: Secondary | ICD-10-CM | POA: Diagnosis not present

## 2021-07-14 DIAGNOSIS — E875 Hyperkalemia: Secondary | ICD-10-CM | POA: Diagnosis not present

## 2021-07-14 DIAGNOSIS — N038 Chronic nephritic syndrome with other morphologic changes: Secondary | ICD-10-CM | POA: Diagnosis not present

## 2021-07-14 DIAGNOSIS — I129 Hypertensive chronic kidney disease with stage 1 through stage 4 chronic kidney disease, or unspecified chronic kidney disease: Secondary | ICD-10-CM | POA: Diagnosis not present

## 2021-07-14 DIAGNOSIS — N1831 Chronic kidney disease, stage 3a: Secondary | ICD-10-CM | POA: Diagnosis not present

## 2021-07-14 DIAGNOSIS — R809 Proteinuria, unspecified: Secondary | ICD-10-CM | POA: Diagnosis not present

## 2021-07-21 ENCOUNTER — Other Ambulatory Visit: Payer: Self-pay | Admitting: Cardiology

## 2021-07-21 DIAGNOSIS — Z5181 Encounter for therapeutic drug level monitoring: Secondary | ICD-10-CM

## 2021-07-21 DIAGNOSIS — I482 Chronic atrial fibrillation, unspecified: Secondary | ICD-10-CM

## 2021-07-31 NOTE — Progress Notes (Signed)
History of Present Illness: 70 year old male seen once before in February, 2018.  At that time he had erectile dysfunction and was placed on Viagra.  He had a history of microscopic hematuria with history of Cytoxan exposure.  Cystoscopy and cytology were negative.  He did have meatal stenosis.  He does have persistent erectile dysfunction.  Up to 60 mg of sildenafil is not helping.  He does complain of urinary urgency, frequency, urgency incontinence and slow stream.  Nocturia x4.  IPSS 13, quality-of-life score 1.  This is bothersome when talking to him, however.  No gross hematuria, no dysuria.  Past Medical History:  Diagnosis Date   Chronic anticoagulation    Colonoscopy with polypectomy in 2006; due for a repeat procedure as of 2012   Chronic atrial fibrillation ([place]) 2001   Spontaneous contrast on TEE   Degenerative joint disease of spine    Mild; cervical and lumbosacral   Gout    Hyperlipidemia    Overweight(278.02)    Pseudogout     Past Surgical History:  Procedure Laterality Date   COLONOSCOPY N/A 04/23/2013   Procedure: COLONOSCOPY;  Surgeon: Rogene Houston, MD;  [facility];  Service: Endoscopy;  Laterality: N/A;  1030-rescheduled to [place] notified pt   COLONOSCOPY W/ POLYPECTOMY  2006    Home Medications:  Allergies as of 08/01/2021       Reactions   Uloric [febuxostat] Rash        Medication List        Accurate as of July 31, 2021  1:23 PM. If you have any questions, ask your nurse or doctor.          allopurinol 100 MG tablet Commonly known as: ZYLOPRIM Take 300 mg by mouth.   atenolol 50 MG tablet Commonly known as: TENORMIN TAKE ONE TABLET BY MOUTH DAILY.   atorvastatin 20 MG tablet Commonly known as: LIPITOR Take 20 mg by mouth daily.   diltiazem 240 MG 24 hr capsule Commonly known as: Cartia XT Take 1 capsule (240 mg total) by mouth daily.   escitalopram 10 MG tablet Commonly known as: LEXAPRO Take 10 mg by mouth  daily.   sildenafil 20 MG tablet Commonly known as: REVATIO TAKE 3-5 TABLETS 30 MINS PRIOR TO INTERCOURSE   warfarin 5 MG tablet Commonly known as: COUMADIN Take as directed by the anticoagulation clinic. If you are unsure how to take this medication, talk to your nurse or doctor. Original instructions: TAKE 1/2 TABLET BY MOUTH DAILY EXCEPT 1 TAB ON TUESDAYS OR AS DIRECTED BY COUMADIN CLINIC.        Allergies:  Allergies  Allergen Reactions   Uloric [Febuxostat] Rash    Family History  Problem Relation Age of Onset   Stroke Mother    Cancer Mother    Heart attack Father    Arrhythmia Sister        Atrial fibrillation   Arrhythmia Brother    Arrhythmia Brother    Diabetes Other    Heart attack Other    Colon cancer Neg Hx     Social History:  reports that he has never smoked. He has never used smokeless tobacco. He reports that he does not drink alcohol and does not use drugs.  ROS: A complete review of systems was performed.  All systems are negative except for pertinent findings as noted.  Physical Exam:  Vital signs in last 24 hours: There were no vitals taken for this visit. Constitutional:  Alert and oriented, No acute distress Cardiovascular: Regular rate  Respiratory: Normal respiratory effort GI: Abdomen is soft, nontender, nondistended, no abdominal masses. No CVAT.  Obese, no inguinal hernias. Genitourinary: Normal male phallus, testes are descended bilaterally and non-tender and without masses, scrotum is normal in appearance without lesions or masses, perineum is normal on inspection.  Prostate 40 to 50 g, symmetrical, nonnodular, nontender. Lymphatic: No lymphadenopathy Neurologic: Grossly intact, no focal deficits Psychiatric: Normal mood and affect  I have reviewed prior pt notes  I have reviewed notes from referring/previous physicians  I have reviewed urinalysis results--still 3-10 red cells per high-power field  I have reviewed prior PSA  results  I have reviewed prior urine culture   Impression/Assessment:  1.  ED, current dose of sildenafil not working  2.  Overactive bladder symptoms with appropriate emptying.  3.  History of meatal stenosis, I do not see that to be an issue today  Plan:  1.  I will put him on samples of Myrbetriq 50 mg  2.  Sildenafil 100 mg sent in  3.  I will see back in a couple of months to see how he is doing with this

## 2021-08-01 ENCOUNTER — Ambulatory Visit (INDEPENDENT_AMBULATORY_CARE_PROVIDER_SITE_OTHER): Payer: PPO | Admitting: Urology

## 2021-08-01 ENCOUNTER — Encounter: Payer: Self-pay | Admitting: Urology

## 2021-08-01 ENCOUNTER — Other Ambulatory Visit: Payer: Self-pay

## 2021-08-01 VITALS — BP 117/76 | HR 57

## 2021-08-01 DIAGNOSIS — N5201 Erectile dysfunction due to arterial insufficiency: Secondary | ICD-10-CM

## 2021-08-01 DIAGNOSIS — R350 Frequency of micturition: Secondary | ICD-10-CM

## 2021-08-01 DIAGNOSIS — R351 Nocturia: Secondary | ICD-10-CM | POA: Diagnosis not present

## 2021-08-01 DIAGNOSIS — R39198 Other difficulties with micturition: Secondary | ICD-10-CM

## 2021-08-01 DIAGNOSIS — R3129 Other microscopic hematuria: Secondary | ICD-10-CM | POA: Diagnosis not present

## 2021-08-01 LAB — URINALYSIS, ROUTINE W REFLEX MICROSCOPIC
Bilirubin, UA: NEGATIVE
Glucose, UA: NEGATIVE
Ketones, UA: NEGATIVE
Leukocytes,UA: NEGATIVE
Nitrite, UA: NEGATIVE
Specific Gravity, UA: 1.02 (ref 1.005–1.030)
Urobilinogen, Ur: 0.2 mg/dL (ref 0.2–1.0)
pH, UA: 5.5 (ref 5.0–7.5)

## 2021-08-01 LAB — MICROSCOPIC EXAMINATION
Renal Epithel, UA: NONE SEEN /hpf
WBC, UA: NONE SEEN /hpf (ref 0–5)

## 2021-08-01 LAB — BLADDER SCAN AMB NON-IMAGING: Scan Result: 0

## 2021-08-01 MED ORDER — SILDENAFIL CITRATE 100 MG PO TABS: ORAL_TABLET | ORAL | 99 refills | Status: DC

## 2021-08-01 NOTE — Progress Notes (Signed)
Urological Symptom Review PVR 0  Patient is experiencing the following symptoms: Frequent urination Hard to postpone urination Get up at night to urinate Leakage of urine Weak stream   Review of Systems  Gastrointestinal (upper)  : Negative for upper GI symptoms  Gastrointestinal (lower) : Negative for lower GI symptoms  Constitutional : Negative for symptoms  Skin: Negative for skin symptoms  Eyes: Negative for eye symptoms  Ear/Nose/Throat : Negative for Ear/Nose/Throat symptoms  Hematologic/Lymphatic: Negative for Hematologic/Lymphatic symptoms  Cardiovascular : Negative for cardiovascular symptoms  Respiratory : Negative for respiratory symptoms  Endocrine: Negative for endocrine symptoms  Musculoskeletal: Joint pain  Neurological: Negative for neurological symptoms  Psychologic: Negative for psychiatric symptoms

## 2021-08-21 DIAGNOSIS — N3001 Acute cystitis with hematuria: Secondary | ICD-10-CM | POA: Diagnosis not present

## 2021-08-21 DIAGNOSIS — N390 Urinary tract infection, site not specified: Secondary | ICD-10-CM | POA: Diagnosis not present

## 2021-08-21 DIAGNOSIS — R319 Hematuria, unspecified: Secondary | ICD-10-CM | POA: Diagnosis not present

## 2021-08-30 DIAGNOSIS — E875 Hyperkalemia: Secondary | ICD-10-CM | POA: Diagnosis not present

## 2021-08-30 DIAGNOSIS — N038 Chronic nephritic syndrome with other morphologic changes: Secondary | ICD-10-CM | POA: Diagnosis not present

## 2021-08-30 DIAGNOSIS — I129 Hypertensive chronic kidney disease with stage 1 through stage 4 chronic kidney disease, or unspecified chronic kidney disease: Secondary | ICD-10-CM | POA: Diagnosis not present

## 2021-08-30 DIAGNOSIS — R809 Proteinuria, unspecified: Secondary | ICD-10-CM | POA: Diagnosis not present

## 2021-08-30 DIAGNOSIS — N1831 Chronic kidney disease, stage 3a: Secondary | ICD-10-CM | POA: Diagnosis not present

## 2021-09-01 DIAGNOSIS — E785 Hyperlipidemia, unspecified: Secondary | ICD-10-CM | POA: Diagnosis not present

## 2021-09-01 DIAGNOSIS — I4821 Permanent atrial fibrillation: Secondary | ICD-10-CM | POA: Diagnosis not present

## 2021-09-01 DIAGNOSIS — M109 Gout, unspecified: Secondary | ICD-10-CM | POA: Diagnosis not present

## 2021-09-01 DIAGNOSIS — Z23 Encounter for immunization: Secondary | ICD-10-CM | POA: Diagnosis not present

## 2021-09-01 DIAGNOSIS — E039 Hypothyroidism, unspecified: Secondary | ICD-10-CM | POA: Diagnosis not present

## 2021-09-06 DIAGNOSIS — N038 Chronic nephritic syndrome with other morphologic changes: Secondary | ICD-10-CM | POA: Diagnosis not present

## 2021-09-06 DIAGNOSIS — R809 Proteinuria, unspecified: Secondary | ICD-10-CM | POA: Diagnosis not present

## 2021-09-06 DIAGNOSIS — I129 Hypertensive chronic kidney disease with stage 1 through stage 4 chronic kidney disease, or unspecified chronic kidney disease: Secondary | ICD-10-CM | POA: Diagnosis not present

## 2021-09-06 DIAGNOSIS — N1831 Chronic kidney disease, stage 3a: Secondary | ICD-10-CM | POA: Diagnosis not present

## 2021-09-06 DIAGNOSIS — E875 Hyperkalemia: Secondary | ICD-10-CM | POA: Diagnosis not present

## 2021-09-15 ENCOUNTER — Other Ambulatory Visit: Payer: Self-pay | Admitting: Cardiology

## 2021-10-03 ENCOUNTER — Ambulatory Visit: Payer: PPO | Admitting: Urology

## 2021-10-09 NOTE — Progress Notes (Signed)
History of Present Illness: Here for follow-up of lower urinary tract symptoms.  At last visit, placed on Myrbetriq.  He did have prostatic volume of approximately 50 mL.  Myrbetriq has not helped out very much.  He is not had any gross hematuria or dysuria.  Had a negative evaluation for microscopic hematuria in 2018.  That included negative cytology as he did have a history of Cytoxan administration for vasculitis.  Past Medical History:  Diagnosis Date   Chronic anticoagulation    Colonoscopy with polypectomy in 2006; due for a repeat procedure as of 2012   Chronic atrial fibrillation ([place]) 2001   Spontaneous contrast on TEE   Degenerative joint disease of spine    Mild; cervical and lumbosacral   Gout    Hyperlipidemia    Overweight(278.02)    Pseudogout     Past Surgical History:  Procedure Laterality Date   COLONOSCOPY N/A 04/23/2013   Procedure: COLONOSCOPY;  Surgeon: Rogene Houston, MD;  [facility]: [facility];  Service: Endoscopy;  Laterality: N/A;  1030-rescheduled to 830 Ann notified pt   COLONOSCOPY W/ POLYPECTOMY  2006    Home Medications:  Allergies as of 10/10/2021       Reactions   Uloric [febuxostat] Rash        Medication List        Accurate as of October 09, 2021  7:31 PM. If you have any questions, ask your nurse or doctor.          allopurinol 100 MG tablet Commonly known as: ZYLOPRIM Take 300 mg by mouth.   allopurinol 300 MG tablet Commonly known as: ZYLOPRIM Take 300 mg by mouth daily.   atenolol 50 MG tablet Commonly known as: TENORMIN TAKE ONE TABLET BY MOUTH DAILY.   atorvastatin 20 MG tablet Commonly known as: LIPITOR Take 20 mg by mouth daily.   diltiazem 240 MG 24 hr capsule Commonly known as: Cartia XT Take 1 capsule (240 mg total) by mouth daily.   escitalopram 10 MG tablet Commonly known as: LEXAPRO Take 10 mg by mouth daily.   sildenafil 100 MG tablet Commonly known as: VIAGRA 1/2 to 1 tablet p.o. as  needed   sildenafil 20 MG tablet Commonly known as: REVATIO TAKE 3-5 TABLETS 30 MINS PRIOR TO INTERCOURSE   warfarin 5 MG tablet Commonly known as: COUMADIN Take as directed by the anticoagulation clinic. If you are unsure how to take this medication, talk to your nurse or doctor. Original instructions: TAKE 1/2 TABLET BY MOUTH DAILY EXCEPT 1 TAB ON TUESDAYS OR AS DIRECTED BY COUMADIN CLINIC.        Allergies:  Allergies  Allergen Reactions   Uloric [Febuxostat] Rash    Family History  Problem Relation Age of Onset   Stroke Mother    Cancer Mother    Heart attack Father    Arrhythmia Sister        Atrial fibrillation   Arrhythmia Brother    Arrhythmia Brother    Diabetes Other    Heart attack Other    Colon cancer Neg Hx     Social History:  reports that he has never smoked. He has never used smokeless tobacco. He reports that he does not drink alcohol and does not use drugs.  ROS: A complete review of systems was performed.  All systems are negative except for pertinent findings as noted.  Physical Exam:  Vital signs in last 24 hours: There were no vitals taken for this  visit. Constitutional:  Alert and oriented, No acute distress Cardiovascular: Regular rate  Respiratory: Normal respiratory effort Neurologic: Grossly intact, no focal deficits Psychiatric: Normal mood and affect   I have reviewed prior pt notes  I have reviewed notes from referring/previous physicians  I have reviewed urinalysis results     Impression/Assessment:  1.  Microscopic hematuria, persistent, negative evaluation in 2018.  History of Cytoxan exposure  2.  BPH, symptoms not improved with Myrbetriq  Plan:  1.  He will stop Myrbetriq  2.  Urine was sent for cytology  3.  Tamsulosin prescribed  4.  Office visit in 6 months

## 2021-10-10 ENCOUNTER — Ambulatory Visit (INDEPENDENT_AMBULATORY_CARE_PROVIDER_SITE_OTHER): Payer: PPO | Admitting: Urology

## 2021-10-10 ENCOUNTER — Encounter: Payer: Self-pay | Admitting: Urology

## 2021-10-10 ENCOUNTER — Other Ambulatory Visit: Payer: Self-pay

## 2021-10-10 VITALS — BP 95/59 | HR 78 | Temp 98.4°F | Wt 238.2 lb

## 2021-10-10 DIAGNOSIS — R8289 Other abnormal findings on cytological and histological examination of urine: Secondary | ICD-10-CM | POA: Diagnosis not present

## 2021-10-10 DIAGNOSIS — N5201 Erectile dysfunction due to arterial insufficiency: Secondary | ICD-10-CM | POA: Diagnosis not present

## 2021-10-10 DIAGNOSIS — R351 Nocturia: Secondary | ICD-10-CM | POA: Diagnosis not present

## 2021-10-10 DIAGNOSIS — R3129 Other microscopic hematuria: Secondary | ICD-10-CM | POA: Diagnosis not present

## 2021-10-10 DIAGNOSIS — R350 Frequency of micturition: Secondary | ICD-10-CM | POA: Diagnosis not present

## 2021-10-10 LAB — URINALYSIS, ROUTINE W REFLEX MICROSCOPIC
Bilirubin, UA: NEGATIVE
Glucose, UA: NEGATIVE
Ketones, UA: NEGATIVE
Leukocytes,UA: NEGATIVE
Nitrite, UA: NEGATIVE
Specific Gravity, UA: 1.02 (ref 1.005–1.030)
Urobilinogen, Ur: 0.2 mg/dL (ref 0.2–1.0)
pH, UA: 5.5 (ref 5.0–7.5)

## 2021-10-10 LAB — MICROSCOPIC EXAMINATION
Bacteria, UA: NONE SEEN
Renal Epithel, UA: NONE SEEN /hpf
WBC, UA: NONE SEEN /hpf (ref 0–5)

## 2021-10-10 MED ORDER — TAMSULOSIN HCL 0.4 MG PO CAPS: 0.4000 mg | ORAL_CAPSULE | Freq: Every day | ORAL | 3 refills | Status: DC

## 2021-10-10 NOTE — Addendum Note (Signed)
Addended by: Iris Pert on: 10/10/2021 01:05 PM   Modules accepted: Orders

## 2021-10-10 NOTE — Addendum Note (Signed)
Addended byIris Pert on: 10/10/2021 01:16 PM   Modules accepted: Orders

## 2021-10-10 NOTE — Addendum Note (Signed)
Addended by: Dorisann Frames on: 10/10/2021 05:04 PM   Modules accepted: Orders

## 2021-10-10 NOTE — Progress Notes (Signed)
Urological Symptom Review  Patient is experiencing the following symptoms: Frequent urination Get up at night to urinate Weak stream Erection problems (male only)   Review of Systems  Gastrointestinal (upper)  : Negative for upper GI symptoms  Gastrointestinal (lower) : Negative for lower GI symptoms  Constitutional : Negative for symptoms  Skin: Negative for skin symptoms  Eyes: Negative for eye symptoms  Ear/Nose/Throat : Negative for Ear/Nose/Throat symptoms  Hematologic/Lymphatic: Negative for Hematologic/Lymphatic symptoms  Cardiovascular : Negative for cardiovascular symptoms  Respiratory : Negative for respiratory symptoms  Endocrine: Negative for endocrine symptoms  Musculoskeletal: Negative for musculoskeletal symptoms  Neurological: Negative for neurological symptoms  Psychologic: Negative for psychiatric symptoms

## 2021-10-11 LAB — CYTOLOGY, URINE

## 2021-10-23 ENCOUNTER — Other Ambulatory Visit: Payer: Self-pay | Admitting: Cardiology

## 2021-10-23 DIAGNOSIS — Z5181 Encounter for therapeutic drug level monitoring: Secondary | ICD-10-CM

## 2021-10-23 DIAGNOSIS — I482 Chronic atrial fibrillation, unspecified: Secondary | ICD-10-CM

## 2021-10-25 ENCOUNTER — Ambulatory Visit (INDEPENDENT_AMBULATORY_CARE_PROVIDER_SITE_OTHER): Payer: PPO | Admitting: *Deleted

## 2021-10-25 DIAGNOSIS — I482 Chronic atrial fibrillation, unspecified: Secondary | ICD-10-CM

## 2021-10-25 DIAGNOSIS — Z5181 Encounter for therapeutic drug level monitoring: Secondary | ICD-10-CM | POA: Diagnosis not present

## 2021-10-25 LAB — POCT INR: INR: 3.1 — AB (ref 2.0–3.0)

## 2021-10-25 NOTE — Patient Instructions (Signed)
Continue warfarin 1/2 tablet daily except 1 tablet on Tuesdays. Eat extra greens/salad today Recheck in 6 weeks.

## 2021-11-06 DIAGNOSIS — I129 Hypertensive chronic kidney disease with stage 1 through stage 4 chronic kidney disease, or unspecified chronic kidney disease: Secondary | ICD-10-CM | POA: Diagnosis not present

## 2021-11-06 DIAGNOSIS — R809 Proteinuria, unspecified: Secondary | ICD-10-CM | POA: Diagnosis not present

## 2021-11-06 DIAGNOSIS — E875 Hyperkalemia: Secondary | ICD-10-CM | POA: Diagnosis not present

## 2021-11-06 DIAGNOSIS — N038 Chronic nephritic syndrome with other morphologic changes: Secondary | ICD-10-CM | POA: Diagnosis not present

## 2021-11-06 DIAGNOSIS — N1831 Chronic kidney disease, stage 3a: Secondary | ICD-10-CM | POA: Diagnosis not present

## 2021-11-17 DIAGNOSIS — N1831 Chronic kidney disease, stage 3a: Secondary | ICD-10-CM | POA: Diagnosis not present

## 2021-11-17 DIAGNOSIS — N038 Chronic nephritic syndrome with other morphologic changes: Secondary | ICD-10-CM | POA: Diagnosis not present

## 2021-11-17 DIAGNOSIS — E875 Hyperkalemia: Secondary | ICD-10-CM | POA: Diagnosis not present

## 2021-11-17 DIAGNOSIS — I129 Hypertensive chronic kidney disease with stage 1 through stage 4 chronic kidney disease, or unspecified chronic kidney disease: Secondary | ICD-10-CM | POA: Diagnosis not present

## 2021-11-17 DIAGNOSIS — R809 Proteinuria, unspecified: Secondary | ICD-10-CM | POA: Diagnosis not present

## 2021-11-26 ENCOUNTER — Ambulatory Visit
Admission: EM | Admit: 2021-11-26 | Discharge: 2021-11-26 | Disposition: A | Discharge status: Home / Self Care | Payer: PPO

## 2021-11-26 ENCOUNTER — Ambulatory Visit (INDEPENDENT_AMBULATORY_CARE_PROVIDER_SITE_OTHER): Payer: PPO

## 2021-11-26 ENCOUNTER — Other Ambulatory Visit: Payer: Self-pay

## 2021-11-26 DIAGNOSIS — R062 Wheezing: Secondary | ICD-10-CM

## 2021-11-26 DIAGNOSIS — J209 Acute bronchitis, unspecified: Secondary | ICD-10-CM

## 2021-11-26 DIAGNOSIS — R0602 Shortness of breath: Secondary | ICD-10-CM

## 2021-11-26 DIAGNOSIS — R059 Cough, unspecified: Secondary | ICD-10-CM | POA: Diagnosis not present

## 2021-11-26 MED ORDER — DEXAMETHASONE SODIUM PHOSPHATE 10 MG/ML IJ SOLN
10.0000 mg | Freq: Once | INTRAMUSCULAR | Status: AC
  Administered 2021-11-26: 10:00:00 10 mg via INTRAMUSCULAR

## 2021-11-26 MED ORDER — PROMETHAZINE-DM 6.25-15 MG/5ML PO SYRP: 5.0000 mL | ORAL_SOLUTION | Freq: Four times a day (QID) | ORAL | 0 refills | Status: DC | PRN

## 2021-11-26 MED ORDER — ALBUTEROL SULFATE HFA 108 (90 BASE) MCG/ACT IN AERS
2.0000 | INHALATION_SPRAY | Freq: Once | RESPIRATORY_TRACT | Status: AC
  Administered 2021-11-26: 10:00:00 2 via RESPIRATORY_TRACT

## 2021-11-26 MED ORDER — PREDNISONE 20 MG PO TABS: 40.0000 mg | ORAL_TABLET | Freq: Every day | ORAL | 0 refills | Status: DC

## 2021-11-26 NOTE — ED Provider Notes (Signed)
[facility]    CSN: [id number] Arrival date & time: 11/26/21  0825      History   Chief Complaint Chief Complaint  Patient presents with   Cough    Bad cough     HPI Dillon Strickland is a 69 y.o. male.   Patient presenting today with over a week of progressively worsening hacking productive cough, wheezing, chest tightness, now shortness of breath.  States his sides are hurting so bad it feels like he has been beat up from all the coughing.  He denies known fever, chills, abdominal pain, nausea vomiting, persisting sore throat or significant nasal congestion.  Has been taking Tessalon from his PCP as well as Mucinex with no relief.  No known history of chronic pulmonary disease.    Past Medical History:  Diagnosis Date   Chronic anticoagulation    Colonoscopy with polypectomy in 2006; due for a repeat procedure as of 2012   Chronic atrial fibrillation ([place]) 2001   Spontaneous contrast on TEE   Degenerative joint disease of spine    Mild; cervical and lumbosacral   Gout    Hyperlipidemia    Overweight(278.02)    Pseudogout     Patient Active Problem List   Diagnosis Date Noted   Acute renal failure ([place]) 04/28/2014   ARF (acute renal failure) ([place]) 04/27/2014   Laboratory test 02/05/2012   Gout 02/05/2012   Chronic atrial fibrillation ([facility])    Chronic anticoagulation    Hyperlipidemia     Past Surgical History:  Procedure Laterality Date   COLONOSCOPY N/A 04/23/2013   Procedure: COLONOSCOPY;  Surgeon: Rogene Houston, MD;  Location: [facility];  Service: Endoscopy;  Laterality: N/A;  1030-rescheduled to 38 Ann notified pt   COLONOSCOPY W/ POLYPECTOMY  2006       Home Medications    Prior to Admission medications   Medication Sig Start Date End Date Taking? Authorizing Provider  predniSONE (DELTASONE) 20 MG tablet Take 2 tablets (40 mg total) by mouth daily with breakfast. 11/26/21  Yes Volney American, PA-C   promethazine-dextromethorphan (PROMETHAZINE-DM) 6.25-15 MG/5ML syrup Take 5 mLs by mouth 4 (four) times daily as needed. 11/26/21  Yes Volney American, PA-C  allopurinol (ZYLOPRIM) 100 MG tablet Take 300 mg by mouth.    [provider]  allopurinol (ZYLOPRIM) 300 MG tablet Take 300 mg by mouth daily. 07/21/21   [provider]  atenolol (TENORMIN) 50 MG tablet TAKE ONE TABLET BY MOUTH DAILY. 09/15/21   Arnoldo Lenis, MD  atorvastatin (LIPITOR) 20 MG tablet Take 20 mg by mouth daily.    [provider]  benzonatate (TESSALON) 200 MG capsule Take 200 mg by mouth 3 (three) times daily. 11/20/21   [provider]  diltiazem (CARTIA XT) 240 MG 24 hr capsule Take 1 capsule (240 mg total) by mouth daily. 08/25/14   Ward, Delice [place], DO  escitalopram (LEXAPRO) 10 MG tablet Take 10 mg by mouth daily.    [provider]  lisinopril (ZESTRIL) 2.5 MG tablet Take 2.5 mg by mouth daily. 09/06/21   [provider]  sildenafil (REVATIO) 20 MG tablet TAKE 3-5 TABLETS 30 MINS PRIOR TO INTERCOURSE 03/08/21   Arnoldo Lenis, MD  sildenafil (VIAGRA) 100 MG tablet 1/2 to 1 tablet p.o. as needed 08/01/21   Franchot Gallo, MD  tamsulosin (FLOMAX) 0.4 MG CAPS capsule Take 1 capsule (0.4 mg total) by mouth daily. 10/10/21   Franchot Gallo, MD  warfarin (COUMADIN) 5 MG tablet TAKE 1/2 TABLET BY MOUTH DAILY EXCEPT 1 TAB ON TUESDAYS AS DIRECTED BY COUMADIN CLINIC. 10/23/21   Satira Sark, MD    Family History Family History  Problem Relation Age of Onset   Stroke Mother    Cancer Mother    Heart attack Father    Arrhythmia Sister        Atrial fibrillation   Arrhythmia Brother    Arrhythmia Brother    Diabetes Other    Heart attack Other    Colon cancer Neg Hx     Social History Social History   Tobacco Use   Smoking status: Never   Smokeless tobacco: Never  Vaping Use   Vaping Use: Never used  Substance Use Topics   Alcohol use:  No    Comment: occasional beer, glass of wine/month - denies at this time 05/18/14   Drug use: No     Allergies   Uloric [febuxostat]   Review of Systems Review of Systems Per HPI  Physical Exam Triage Vital Signs ED Triage Vitals  Enc Vitals Group     BP 11/26/21 0932 122/85     Pulse Rate 11/26/21 0932 91     Resp 11/26/21 0932 18     Temp 11/26/21 0932 98.2 F (36.8 C)     Temp Source 11/26/21 0932 Oral     SpO2 11/26/21 0932 93 %     Weight --      Height --      Head Circumference --      Peak Flow --      Pain Score 11/26/21 0926 5     Pain Loc --      Pain Edu? --      Excl. in [place]? --    No data found.  Updated Vital Signs BP 122/85 (BP Location: Right Arm)    Pulse 91    Temp 98.2 F (36.8 C) (Oral)    Resp 18    SpO2 98% Comment: oxygen rechecked by doctor after receiving meds  Visual Acuity Right Eye Distance:   Left Eye Distance:   Bilateral Distance:    Right Eye Near:   Left Eye Near:    Bilateral Near:     Physical Exam Vitals and nursing note reviewed.  Constitutional:      Appearance: Normal appearance.  HENT:     Head: Atraumatic.     Nose: Nose normal.     Mouth/Throat:     Mouth: Mucous membranes are moist.     Pharynx: Posterior oropharyngeal erythema present. No oropharyngeal exudate.  Eyes:     Extraocular Movements: Extraocular movements intact.     Conjunctiva/sclera: Conjunctivae normal.  Cardiovascular:     Rate and Rhythm: Normal rate and regular rhythm.  Pulmonary:     Effort: Pulmonary effort is normal.     Breath sounds: Wheezing present.     Comments: Significant diffuse wheezes bilaterally. Abdominal:     General: Bowel sounds are normal. There is no distension.     Palpations: Abdomen is soft.     Tenderness: There is no abdominal tenderness. There is no guarding.  Musculoskeletal:        General: Normal range of motion.     Cervical back: Normal range of motion and neck supple.  Skin:    General: Skin is warm  and dry.  Neurological:     General: No focal deficit present.     Mental Status:  He is oriented to person, place, and time.     Motor: No weakness.     Gait: Gait normal.  Psychiatric:        Mood and Affect: Mood normal.        Thought Content: Thought content normal.        Judgment: Judgment normal.     UC Treatments / Results  Labs (all labs ordered are listed, but only abnormal results are displayed) Labs Reviewed - No data to display  EKG   Radiology DG Chest 2 View  Result Date: 11/26/2021 CLINICAL DATA:  Worsening productive cough.  Shortness of breath. EXAM: CHEST - 2 VIEW COMPARISON:  None. FINDINGS: The heart size and mediastinal contours are within normal limits. Both lungs are clear. The visualized skeletal structures are unremarkable. IMPRESSION: No active cardiopulmonary disease. Electronically Signed   By: Dorise Bullion III M.D.   On: 11/26/2021 10:21    Procedures Procedures (including critical care time)  Medications Ordered in UC Medications  albuterol (VENTOLIN HFA) 108 (90 Base) MCG/ACT inhaler 2 puff (2 puffs Inhalation Given 11/26/21 1005)  dexamethasone (DECADRON) injection 10 mg (10 mg Intramuscular Given 11/26/21 1005)    Initial Impression / Assessment and Plan / UC Course  I have reviewed the triage vital signs and the nursing notes.  Pertinent labs & imaging results that were available during my care of the patient were reviewed by me and considered in my medical decision making (see chart for details).     Oxygen much lower than baseline upon arrival at 93% on room air.  Significant wheezes noted on exam so albuterol inhaler administered as well as IM Decadron, chest x-ray performed with no acute cardiopulmonary abnormality noted.  Upon recheck, oxygen saturation had improved to 98% on room air and lungs were much improved with only mild to moderate wheezes and much better air movement.  Patient also noting symptomatic benefit from these  medications.  Will discharge home on prednisone, Phenergan DM in addition to his over-the-counter medications and supportive home care.  Recheck next week with PCP and return sooner if worsening at any time.  45 minutes spent today in direct patient care, management and education.  Final Clinical Impressions(s) / UC Diagnoses   Final diagnoses:  Acute bronchitis, unspecified organism  SOB (shortness of breath)  Wheezing   Discharge Instructions   None    ED Prescriptions     Medication Sig Dispense Auth. Provider   predniSONE (DELTASONE) 20 MG tablet Take 2 tablets (40 mg total) by mouth daily with breakfast. 10 tablet Volney American, PA-C   promethazine-dextromethorphan (PROMETHAZINE-DM) 6.25-15 MG/5ML syrup Take 5 mLs by mouth 4 (four) times daily as needed. 100 mL Volney American, [place]      PDMP not reviewed this encounter.   Volney American, [place] 11/26/21 1041

## 2021-11-26 NOTE — ED Triage Notes (Signed)
Patient states he called his PCP for his cough and they prescribed him Benzonatate 200mg .  Patient states that his cough has a clear gobs mucus and it is progressing. He states that both of his sides are hurting from coughing.     Denies Fever.

## 2021-11-30 ENCOUNTER — Encounter: Payer: Self-pay | Admitting: Emergency Medicine

## 2021-11-30 ENCOUNTER — Emergency Department (HOSPITAL_COMMUNITY): Payer: PPO

## 2021-11-30 ENCOUNTER — Other Ambulatory Visit: Payer: Self-pay

## 2021-11-30 ENCOUNTER — Ambulatory Visit
Admission: EM | Admit: 2021-11-30 | Discharge: 2021-11-30 | Disposition: A | Discharge status: Home / Self Care | Payer: PPO

## 2021-11-30 ENCOUNTER — Inpatient Hospital Stay (HOSPITAL_COMMUNITY)
Admission: EM | Admit: 2021-11-30 | Discharge: 2021-12-02 | DRG: 813 | Disposition: A | Discharge status: Home / Self Care | Payer: PPO | Attending: Internal Medicine | Admitting: Internal Medicine

## 2021-11-30 ENCOUNTER — Encounter (HOSPITAL_COMMUNITY): Payer: Self-pay | Admitting: *Deleted

## 2021-11-30 DIAGNOSIS — E785 Hyperlipidemia, unspecified: Secondary | ICD-10-CM | POA: Diagnosis not present

## 2021-11-30 DIAGNOSIS — R109 Unspecified abdominal pain: Secondary | ICD-10-CM | POA: Diagnosis present

## 2021-11-30 DIAGNOSIS — S301XXA Contusion of abdominal wall, initial encounter: Secondary | ICD-10-CM

## 2021-11-30 DIAGNOSIS — D6832 Hemorrhagic disorder due to extrinsic circulating anticoagulants: Principal | ICD-10-CM | POA: Diagnosis present

## 2021-11-30 DIAGNOSIS — I482 Chronic atrial fibrillation, unspecified: Secondary | ICD-10-CM | POA: Diagnosis not present

## 2021-11-30 DIAGNOSIS — T45515A Adverse effect of anticoagulants, initial encounter: Secondary | ICD-10-CM | POA: Diagnosis not present

## 2021-11-30 DIAGNOSIS — Q632 Ectopic kidney: Secondary | ICD-10-CM | POA: Diagnosis not present

## 2021-11-30 DIAGNOSIS — Z888 Allergy status to other drugs, medicaments and biological substances status: Secondary | ICD-10-CM

## 2021-11-30 DIAGNOSIS — Z8249 Family history of ischemic heart disease and other diseases of the circulatory system: Secondary | ICD-10-CM | POA: Diagnosis not present

## 2021-11-30 DIAGNOSIS — S3011XA Contusion of abdominal wall, initial encounter: Secondary | ICD-10-CM | POA: Diagnosis present

## 2021-11-30 DIAGNOSIS — T148XXA Other injury of unspecified body region, initial encounter: Secondary | ICD-10-CM

## 2021-11-30 DIAGNOSIS — M7981 Nontraumatic hematoma of soft tissue: Secondary | ICD-10-CM | POA: Diagnosis not present

## 2021-11-30 DIAGNOSIS — I129 Hypertensive chronic kidney disease with stage 1 through stage 4 chronic kidney disease, or unspecified chronic kidney disease: Secondary | ICD-10-CM | POA: Diagnosis present

## 2021-11-30 DIAGNOSIS — M47817 Spondylosis without myelopathy or radiculopathy, lumbosacral region: Secondary | ICD-10-CM | POA: Diagnosis not present

## 2021-11-30 DIAGNOSIS — M109 Gout, unspecified: Secondary | ICD-10-CM | POA: Diagnosis present

## 2021-11-30 DIAGNOSIS — N261 Atrophy of kidney (terminal): Secondary | ICD-10-CM | POA: Diagnosis not present

## 2021-11-30 DIAGNOSIS — J9811 Atelectasis: Secondary | ICD-10-CM | POA: Diagnosis not present

## 2021-11-30 DIAGNOSIS — M7989 Other specified soft tissue disorders: Secondary | ICD-10-CM | POA: Diagnosis not present

## 2021-11-30 DIAGNOSIS — E782 Mixed hyperlipidemia: Secondary | ICD-10-CM | POA: Diagnosis not present

## 2021-11-30 DIAGNOSIS — Z20822 Contact with and (suspected) exposure to covid-19: Secondary | ICD-10-CM | POA: Diagnosis not present

## 2021-11-30 DIAGNOSIS — N1832 Chronic kidney disease, stage 3b: Secondary | ICD-10-CM | POA: Diagnosis present

## 2021-11-30 DIAGNOSIS — R1900 Intra-abdominal and pelvic swelling, mass and lump, unspecified site: Secondary | ICD-10-CM | POA: Diagnosis not present

## 2021-11-30 DIAGNOSIS — Z7901 Long term (current) use of anticoagulants: Secondary | ICD-10-CM | POA: Diagnosis not present

## 2021-11-30 DIAGNOSIS — R051 Acute cough: Secondary | ICD-10-CM

## 2021-11-30 DIAGNOSIS — Z823 Family history of stroke: Secondary | ICD-10-CM

## 2021-11-30 DIAGNOSIS — Z809 Family history of malignant neoplasm, unspecified: Secondary | ICD-10-CM | POA: Diagnosis not present

## 2021-11-30 DIAGNOSIS — M47812 Spondylosis without myelopathy or radiculopathy, cervical region: Secondary | ICD-10-CM | POA: Diagnosis present

## 2021-11-30 DIAGNOSIS — I878 Other specified disorders of veins: Secondary | ICD-10-CM | POA: Diagnosis not present

## 2021-11-30 DIAGNOSIS — J90 Pleural effusion, not elsewhere classified: Secondary | ICD-10-CM | POA: Diagnosis not present

## 2021-11-30 LAB — BASIC METABOLIC PANEL
Anion gap: 7 (ref 5–15)
BUN: 38 mg/dL — ABNORMAL HIGH (ref 8–23)
CO2: 22 mmol/L (ref 22–32)
Calcium: 7.9 mg/dL — ABNORMAL LOW (ref 8.9–10.3)
Chloride: 105 mmol/L (ref 98–111)
Creatinine, Ser: 1.8 mg/dL — ABNORMAL HIGH (ref 0.61–1.24)
GFR, Estimated: 40 mL/min — ABNORMAL LOW (ref >60)
Glucose, Bld: 139 mg/dL — ABNORMAL HIGH (ref 70–99)
Potassium: 4.5 mmol/L (ref 3.5–5.1)
Sodium: 134 mmol/L — ABNORMAL LOW (ref 135–145)

## 2021-11-30 LAB — RESP PANEL BY RT-PCR (FLU A&B, COVID) ARPGX2
Influenza A by PCR: NEGATIVE
Influenza B by PCR: NEGATIVE
SARS Coronavirus 2 by RT PCR: NEGATIVE

## 2021-11-30 LAB — CBC
HCT: 39.5 % (ref 39.0–52.0)
Hemoglobin: 12.9 g/dL — ABNORMAL LOW (ref 13.0–17.0)
MCH: 31.2 pg (ref 26.0–34.0)
MCHC: 32.7 g/dL (ref 30.0–36.0)
MCV: 95.4 fL (ref 80.0–100.0)
Platelets: 219 10*3/uL (ref 150–400)
RBC: 4.14 MIL/uL — ABNORMAL LOW (ref 4.22–5.81)
RDW: 15.3 % (ref 11.5–15.5)
WBC: 11.7 10*3/uL — ABNORMAL HIGH (ref 4.0–10.5)
nRBC: 0 % (ref 0.0–0.2)

## 2021-11-30 LAB — PROTIME-INR
INR: 3.1 — ABNORMAL HIGH (ref 0.8–1.2)
Prothrombin Time: 31.6 seconds — ABNORMAL HIGH (ref 11.4–15.2)

## 2021-11-30 MED ORDER — TAMSULOSIN HCL 0.4 MG PO CAPS
0.4000 mg | ORAL_CAPSULE | Freq: Every day | ORAL | Status: DC
  Administered 2021-11-30 – 2021-12-02 (×3): 0.4 mg via ORAL
  Filled 2021-11-30 (×3): qty 1

## 2021-11-30 MED ORDER — ATORVASTATIN CALCIUM 20 MG PO TABS
20.0000 mg | ORAL_TABLET | Freq: Every day | ORAL | Status: DC
  Administered 2021-11-30 – 2021-12-02 (×3): 20 mg via ORAL
  Filled 2021-11-30: qty 1
  Filled 2021-11-30 (×2): qty 2

## 2021-11-30 MED ORDER — VITAMIN K1 10 MG/ML IJ SOLN
2.0000 mg | Freq: Once | INTRAVENOUS | Status: AC
  Administered 2021-11-30: 19:00:00 2 mg via INTRAVENOUS
  Filled 2021-11-30: qty 0.2

## 2021-11-30 MED ORDER — HYDROCODONE-ACETAMINOPHEN 5-325 MG PO TABS
1.0000 | ORAL_TABLET | Freq: Once | ORAL | Status: AC
  Administered 2021-11-30: 13:00:00 1 via ORAL
  Filled 2021-11-30: qty 1

## 2021-11-30 MED ORDER — TRAZODONE HCL 50 MG PO TABS
50.0000 mg | ORAL_TABLET | Freq: Every evening | ORAL | Status: DC | PRN
  Administered 2021-12-01: 21:00:00 50 mg via ORAL
  Filled 2021-11-30: qty 1

## 2021-11-30 MED ORDER — DILTIAZEM HCL ER COATED BEADS 240 MG PO CP24
240.0000 mg | ORAL_CAPSULE | Freq: Every day | ORAL | Status: DC
  Administered 2021-11-30 – 2021-12-02 (×3): 240 mg via ORAL
  Filled 2021-11-30 (×3): qty 1

## 2021-11-30 MED ORDER — VITAMIN K1 1 MG/0.5ML IJ SOLN
INTRAMUSCULAR | Status: AC
  Filled 2021-11-30: qty 1

## 2021-11-30 MED ORDER — ALLOPURINOL 300 MG PO TABS
300.0000 mg | ORAL_TABLET | Freq: Every day | ORAL | Status: DC
  Administered 2021-11-30 – 2021-12-02 (×3): 300 mg via ORAL
  Filled 2021-11-30 (×3): qty 1

## 2021-11-30 MED ORDER — ATENOLOL 25 MG PO TABS
50.0000 mg | ORAL_TABLET | Freq: Every day | ORAL | Status: DC
  Administered 2021-12-01 – 2021-12-02 (×2): 50 mg via ORAL
  Filled 2021-11-30 (×2): qty 2

## 2021-11-30 MED ORDER — HYDROCODONE-ACETAMINOPHEN 7.5-325 MG PO TABS
1.0000 | ORAL_TABLET | Freq: Four times a day (QID) | ORAL | Status: DC | PRN
  Administered 2021-11-30: 23:00:00 1 via ORAL
  Filled 2021-11-30: qty 1

## 2021-11-30 NOTE — H&P (Signed)
TR[facility] H&P   Patient Demographics:    Dillon Strickland, is a 70 y.o. male  MRN: [id number]   DOB - [birth year]  Admit Date - 11/30/2021  Outpatient Primary MD for the patient is Asencion Noble, MD  Referring MD/NP/PA: Clide Cliff  Outpatient Specialists: cardiology Dr Harl Bowie  Patient coming from: home  Chief Complaint  Patient presents with   Abdominal Pain      HPI:    Dillon Strickland  is a 70 y.o. male, with past medical history of A. fib, on warfarin (intolerant to Eliquis in the past), hereditary solitary kidney, CKD stage IIIB, hypertension, vasculitis??,  Patient presents to ED secondary to bruising and pain in left flank, patient report upper respiratory infection/bronchitis few days ago, for which he was given prednisone by his PCP, he is on warfarin, he takes aspirin intermittently for pain, but no significant use for last couple days, only today because of his flank pain, he does report cough as well, reports his wife tested positive for COVID-19, ports he stopped his warfarin 2 days ago because he was taking many over-the-counter pain relievers for his illness, reports sudden onset of left flank pain and bruising over last 24 hours which prompted him to come to ED. -In ED CT abdomen pelvis significant for left intramuscular/chest wall hematoma, with left pleural effusion, creatinine at baseline 1.8, hemoglobin is stable at 12.9, it is stable at 219K, INR is 3.1, [facility] hospitalist consulted to admit.    Review of systems:    In addition to the HPI above,  No Fever-chills, No Headache, No changes with Vision or hearing, No problems swallowing food or Liquids, No Chest pain, reports upper respiratory symptoms include cough, congestion. Reports left flank pain, no Nausea or Vommitting, Bowel movements are regular, No Blood in stool or Urine, No dysuria, No new skin rashes or  bruises, No new joints pains-aches,  No new weakness, tingling, numbness in any extremity, No recent weight gain or loss, No polyuria, polydypsia or polyphagia, No significant Mental Stressors.  A full 10 point Review of Systems was done, except as stated above, all other Review of Systems were negative.   With Past History of the following :    Past Medical History:  Diagnosis Date   Chronic anticoagulation    Colonoscopy with polypectomy in 2006; due for a repeat procedure as of 2012   Chronic atrial fibrillation ([place]) 2001   Spontaneous contrast on TEE   Degenerative joint disease of spine    Mild; cervical and lumbosacral   Gout    Hyperlipidemia    Overweight(278.02)    Pseudogout       Past Surgical History:  Procedure Laterality Date   COLONOSCOPY N/A 04/23/2013   Procedure: COLONOSCOPY;  Surgeon: Rogene Houston, MD;  [facility];  Service: Endoscopy;  Laterality: N/A;  1030-rescheduled to [place] notified pt  COLONOSCOPY W/ POLYPECTOMY  2006      Social History:     Social History   Tobacco Use   Smoking status: Never   Smokeless tobacco: Never  Substance Use Topics   Alcohol use: No    Comment: occasional beer, glass of wine/month - denies at this time 05/18/14       Family History :     Family History  Problem Relation Age of Onset   Stroke Mother    Cancer Mother    Heart attack Father    Arrhythmia Sister        Atrial fibrillation   Arrhythmia Brother    Arrhythmia Brother    Diabetes Other    Heart attack Other    Colon cancer Neg Hx       Home Medications:   Prior to Admission medications   Medication Sig Start Date End Date Taking? Authorizing Provider  allopurinol (ZYLOPRIM) 300 MG tablet Take 300 mg by mouth daily. 07/21/21  Yes [provider]  aspirin EC 81 MG tablet Take 162 mg by mouth daily. Swallow whole.   Yes [provider]  atenolol (TENORMIN) 50 MG tablet TAKE ONE TABLET BY MOUTH DAILY.  09/15/21  Yes BranchAlphonse Guild, MD  atorvastatin (LIPITOR) 20 MG tablet Take 20 mg by mouth daily.   Yes [provider]  diltiazem (CARTIA XT) 240 MG 24 hr capsule Take 1 capsule (240 mg total) by mouth daily. 08/25/14  Yes Ward, Kristen N, DO  lisinopril (ZESTRIL) 2.5 MG tablet Take 2.5 mg by mouth daily. 09/06/21  Yes [provider]  predniSONE (DELTASONE) 20 MG tablet Take 2 tablets (40 mg total) by mouth daily with breakfast. 11/26/21  Yes Volney American, PA-C  promethazine-dextromethorphan (PROMETHAZINE-DM) 6.25-15 MG/5ML syrup Take 5 mLs by mouth 4 (four) times daily as needed. 11/26/21  Yes Volney American, PA-C  tamsulosin (FLOMAX) 0.4 MG CAPS capsule Take 1 capsule (0.4 mg total) by mouth daily. 10/10/21  Yes Dahlstedt, Annie Main, MD  warfarin (COUMADIN) 5 MG tablet TAKE 1/2 TABLET BY MOUTH DAILY EXCEPT 1 TAB ON TUESDAYS AS DIRECTED BY COUMADIN CLINIC. 10/23/21  Yes Satira Sark, MD  allopurinol (ZYLOPRIM) 100 MG tablet Take 300 mg by mouth. Patient not taking: Reported on 11/30/2021    [provider]  benzonatate (TESSALON) 200 MG capsule Take 200 mg by mouth 3 (three) times daily. Patient not taking: Reported on 11/30/2021 11/20/21   [provider]  escitalopram (LEXAPRO) 10 MG tablet Take 10 mg by mouth daily. Patient not taking: Reported on 11/30/2021    [provider]  escitalopram (LEXAPRO) 20 MG tablet Take 20 mg by mouth daily. Patient not taking: Reported on 11/30/2021 11/13/21   [provider]  Promethazine HCl 6.25 MG/5ML SOLN Take 5 mLs by mouth every 4 (four) hours as needed. Patient not taking: Reported on 11/30/2021 11/28/21   [provider]  sildenafil (REVATIO) 20 MG tablet TAKE 3-5 TABLETS 30 MINS PRIOR TO INTERCOURSE Patient not taking: Reported on 11/30/2021 03/08/21   Arnoldo Lenis, MD  sildenafil (VIAGRA) 100 MG tablet 1/2 to 1 tablet p.o. as needed Patient not taking:  Reported on 11/30/2021 08/01/21   Franchot Gallo, MD     Allergies:     Allergies  Allergen Reactions   Uloric [Febuxostat] Rash     Physical Exam:   Vitals  Blood pressure 115/73, pulse 91, temperature 97.6 F (36.4 C), temperature source Oral, resp. rate 19,  SpO2 97 %.   1. General developed male, laying in bed in no apparent distress  2. Normal affect and insight, Not Suicidal or Homicidal, Awake Alert, Oriented X 3.  3. No F.N deficits, ALL C.Nerves Intact, Strength 5/5 all 4 extremities, Sensation intact all 4 extremities, Plantars down Strickland.  4. Ears and Eyes appear Normal, Conjunctivae clear, PERRLA. Moist Oral Mucosa.  5. Supple Neck, No JVD, No cervical lymphadenopathy appriciated, No Carotid Bruits.  6. Symmetrical Chest wall movement, diminished air entry at the left lung base   7.  Irregular irregular, No Gallops, Rubs or Murmurs, No Parasternal Heave.  8. Positive Bowel Sounds, Abdomen Soft, sent with bruising and tenderness and left flank/abdominal wall area, please see pictures below, s, No organomegaly appriciated,No rebound -guarding or rigidity.  9.  No Cyanosis, Normal Skin Turgor, No Skin Rash or Bruise.  10. Good muscle tone,  joints appear normal , no effusions, Normal ROM.  11. No Palpable Lymph Nodes in Neck or Axillae      Data Review:    CBC Recent Labs  Lab 11/30/21 1254  WBC 11.7*  HGB 12.9*  HCT 39.5  PLT 219  MCV 95.4  MCH 31.2  MCHC 32.7  RDW 15.3   ------------------------------------------------------------------------------------------------------------------  Chemistries  Recent Labs  Lab 11/30/21 1254  NA 134*  K 4.5  CL 105  CO2 22  GLUCOSE 139*  BUN 38*  CREATININE 1.80*  CALCIUM 7.9*   ------------------------------------------------------------------------------------------------------------------ CrCl cannot be calculated (Unknown ideal  weight.). ------------------------------------------------------------------------------------------------------------------ No results for input(s): TSH, T4TOTAL, T3FREE, THYROIDAB in the last 72 hours.  Invalid input(s): FREET3  Coagulation profile Recent Labs  Lab 11/30/21 1254  INR 3.1*   ------------------------------------------------------------------------------------------------------------------- No results for input(s): DDIMER in the last 72 hours. -------------------------------------------------------------------------------------------------------------------  Cardiac Enzymes No results for input(s): CKMB, TROPONINI, MYOGLOBIN in the last 168 hours.  Invalid input(s): CK ------------------------------------------------------------------------------------------------------------------ No results found for: BNP   ---------------------------------------------------------------------------------------------------------------  Urinalysis    Component Value Date/Time   COLORURINE YELLOW 01/29/2017 1600   APPEARANCEUR Clear 10/10/2021 1310   LABSPEC 1.012 01/29/2017 1600   PHURINE 5.0 01/29/2017 1600   GLUCOSEU Negative 10/10/2021 1310   HGBUR MODERATE (A) 01/29/2017 1600   BILIRUBINUR Negative 10/10/2021 1310   KETONESUR negative 05/04/2021 1012   KETONESUR NEGATIVE 01/29/2017 1600   PROTEINUR Trace (A) 10/10/2021 1310   PROTEINUR NEGATIVE 01/29/2017 1600   UROBILINOGEN 0.2 05/04/2021 1012   UROBILINOGEN 0.2 11/29/2014 0037   NITRITE Negative 10/10/2021 1310   NITRITE NEGATIVE 01/29/2017 1600   LEUKOCYTESUR Negative 10/10/2021 1310    ----------------------------------------------------------------------------------------------------------------   Imaging Results:    CT ABDOMEN PELVIS WO CONTRAST  Result Date: 11/30/2021 CLINICAL DATA:  LEFT-sided abdominal pain and swelling. EXAM: CT ABDOMEN AND PELVIS WITHOUT CONTRAST TECHNIQUE: Multidetector CT imaging  of the abdomen and pelvis was performed following the standard protocol without IV contrast. COMPARISON:  None. FINDINGS: Lower chest: Moderate size layering LEFT effusion with associated atelectasis of the LEFT lower lobe. Pleural fluid collection is intermediate density. Mild airspace disease surrounds atelectasis. Hepatobiliary: No focal hepatic lesion. No biliary duct dilatation. Common bile duct is normal. Pancreas: Fatty replacement the pancreas. Spleen: Normal spleen Adrenals/urinary tract: Adrenal glands normal. The LEFT kidney is atrophic and malposition in the LEFT upper pelvis. RIGHT kidney is normal noncontrast exam. Ureters and bladder normal. Stomach/Bowel: Stomach, small bowel, appendix, and cecum are normal. The colon and rectosigmoid colon are normal. Vascular/Lymphatic: Abdominal aorta is normal caliber. No periportal or retroperitoneal adenopathy. No pelvic  adenopathy. Reproductive: Unremarkable Other: No intraperitoneal free fluid. Musculoskeletal: There is a large ovoid hematoma in the LEFT flank wall extending from the inferior costal margin the level the pelvic brim. Hematoma within the musculature of the LEFT lateral abdominal wall. Hematoma measures 7.9 x 6.9 16.1 cm (volume = 460 cm^3). Smaller hematoma slightly dorsal to the dominant hematoma within inter costal space (image 134/series 6). No associated rib fracture adjacent to the hematoma. No pelvic fracture identified. IMPRESSION: 1. Large (500 cc) intramuscular hematoma along the LEFT abdominal and chest wall. No associated rib fracture identified. 2. Intermediate density LEFT pleural effusion. Cannot exclude hemothorax. Recommend correlation with history of trauma. No rib fracture identified. 3. No evidence of splenic injury. 4. Atrophic mal positioned LEFT kidney. Electronically Signed   By: Suzy Bouchard M.D.   On: 11/30/2021 16:02   DG Chest 2 View  Result Date: 11/30/2021 CLINICAL DATA:  Left pleural effusion EXAM: CHEST -  2 VIEW COMPARISON:  CT abdomen 11/30/2021 FINDINGS: Small left pleural effusion. Left basilar atelectasis. No right pleural effusion. No pneumothorax. Heart and mediastinal contours are unremarkable. No acute osseous abnormality. IMPRESSION: Small left pleural effusion with left basilar atelectasis. Electronically Signed   By: Kathreen Devoid M.D.   On: 11/30/2021 17:18    My personal review of EKG: pending   Assessment & Plan:    Principal Problem:   Abdominal wall hematoma Active Problems:   Chronic atrial fibrillation (HCC)   Chronic anticoagulation   Hyperlipidemia   Gout  Abdominal wall hematoma -Patient presents with left flank pain, imaging significant for abdominal wall hematoma, most likely due to coughing/sneezing with recent upper respiratory infection, use of blood thinner. -Apply abdominal binders. -Continue with as needed Norco for pain control -We will hold warfarin, likely will need to be held 10 to 14 days till hemoglobin has stabilized, then can resume as an outpatient by PCP or cardiology. -Continue to hold aspirin. -If bleeding has not stopped, then likely will need interventional radiology evaluation for possible embolization in case his H&H keeps dropping. -We will give 2 mg of IV vitamin K to reverse warfarin especially he may need thoracentesis.  Left pleural effusion -Most likely sympathetic pleural effusion from left flank abdominal wall hematoma, less likely due to hemothorax, will give vitamin K, and request thoracentesis to evaluate(if there is a safe access for thoracentesis and some chest wall hematoma as well) -We will encourage use of incentive spirometer  A. fib/chronic anticoagulation -Continue with Cardizem and atenolol for heart rate control, will hold anticoagulation due to above -Monitor on telemetry  URI -He just finished treatment with prednisone by his PCP, wife is COVID-19 positive, follow on results  Hyperlipidemia -Continue with home  medications  Gout -Continue with allopurinol  Hypertension -Continue with home medication including atenolol and Cardizem, resume lisinopril when stable  CKD stage IIIb/solitary solid kidney -Function at baseline, continue to monitor closely and avoid nephrotoxic medications  DVT Prophylaxis SCDs  AM Labs Ordered, also please review Full Orders  Family Communication: Admission, patients condition and plan of care including tests being ordered have been discussed with the patient who indicate understanding and agree with the plan and Code Status.  Code Status full  Likely DC to  home  Condition GUARDED    Consults called: none    Admission status: observation    Time spent in minutes : 65 minutes   Phillips Climes M.D on 11/30/2021 at 6:03 PM   [facility] - Office  [phone]

## 2021-11-30 NOTE — ED Triage Notes (Signed)
His wife is covid positive.

## 2021-11-30 NOTE — ED Triage Notes (Signed)
Abdominal swelling left side , referral from urgent care, patient states his wife is covid positive

## 2021-11-30 NOTE — ED Notes (Signed)
Patient is being discharged from the Urgent Care and sent to the Emergency Department via private vehicle . Per Merrie Roof, PA, patient is in need of higher level of care due to abdominal bruising / anticoagulant. Patient is aware and verbalizes understanding of plan of care.  Vitals:   11/30/21 1036 11/30/21 1039  BP: 96/67 113/79  Pulse: 85   Resp: 20   Temp: 98.7 F (37.1 C)   SpO2: 94%

## 2021-11-30 NOTE — ED Provider Notes (Signed)
[facility] DE[facility]MENT Provider Note   CSN: [id number] Arrival date & time: 11/30/21  1130     History Chief Complaint  Patient presents with   Abdominal Pain    Dillon Strickland is a 70 y.o. male.  70 year old male with history of hereditary solitary kidney presents today for evaluation of left flank, left-sided abdominal swelling and pain as well as bruising of 1 day duration.  Patient reports he was recently diagnosed with bronchitis and is having symptom improvement from that standpoint.  He states his wife was diagnosed with COVID over the past couple days and he was at home taking care of her.  He reports he was on the phone with his son last night when he started laughing and went into a coughing spell and had sudden onset of his current symptoms.  He is on Coumadin for A. fib.  He reports he stopped taking this 2 days ago because he was taking some over-the-counter pain medicines which can interfere with his Coumadin.  He denies hematuria, difficulty urinating, nausea, vomiting.  He is without other complaints.  He has not tried anything over-the-counter for this pain.  He reports he does have some improvement since onset.  It is worse with exertion.  The history is provided by the patient. No language interpreter was used.      Past Medical History:  Diagnosis Date   Chronic anticoagulation    Colonoscopy with polypectomy in 2006; due for a repeat procedure as of 2012   Chronic atrial fibrillation ([place]) 2001   Spontaneous contrast on TEE   Degenerative joint disease of spine    Mild; cervical and lumbosacral   Gout    Hyperlipidemia    Overweight(278.02)    Pseudogout     Patient Active Problem List   Diagnosis Date Noted   Acute renal failure ([place]) 04/28/2014   ARF (acute renal failure) ([place]) 04/27/2014   Laboratory test 02/05/2012   Gout 02/05/2012   Chronic atrial fibrillation (HCC)    Chronic anticoagulation    Hyperlipidemia     Past Surgical  History:  Procedure Laterality Date   COLONOSCOPY N/A 04/23/2013   Procedure: COLONOSCOPY;  Surgeon: Rogene Houston, MD;  Location[facility];  Service: Endoscopy;  Laterality: N/A;  1030-rescheduled to [place] notified pt   COLONOSCOPY W/ POLYPECTOMY  2006       Family History  Problem Relation Age of Onset   Stroke Mother    Cancer Mother    Heart attack Father    Arrhythmia Sister        Atrial fibrillation   Arrhythmia Brother    Arrhythmia Brother    Diabetes Other    Heart attack Other    Colon cancer Neg Hx     Social History   Tobacco Use   Smoking status: Never   Smokeless tobacco: Never  Vaping Use   Vaping Use: Never used  Substance Use Topics   Alcohol use: No    Comment: occasional beer, glass of wine/month - denies at this time 05/18/14   Drug use: No    Home Medications Prior to Admission medications   Medication Sig Start Date End Date Taking? Authorizing Provider  allopurinol (ZYLOPRIM) 100 MG tablet Take 300 mg by mouth.    [provider]  allopurinol (ZYLOPRIM) 300 MG tablet Take 300 mg by mouth daily. 07/21/21   [provider]  atenolol (TENORMIN) 50 MG tablet TAKE ONE TABLET BY MOUTH DAILY.  09/15/21   Arnoldo Lenis, MD  atorvastatin (LIPITOR) 20 MG tablet Take 20 mg by mouth daily.    [provider]  benzonatate (TESSALON) 200 MG capsule Take 200 mg by mouth 3 (three) times daily. 11/20/21   [provider]  diltiazem (CARTIA XT) 240 MG 24 hr capsule Take 1 capsule (240 mg total) by mouth daily. 08/25/14   Ward, Delice Bison, DO  escitalopram (LEXAPRO) 10 MG tablet Take 10 mg by mouth daily.    [provider]  lisinopril (ZESTRIL) 2.5 MG tablet Take 2.5 mg by mouth daily. 09/06/21   [provider]  predniSONE (DELTASONE) 20 MG tablet Take 2 tablets (40 mg total) by mouth daily with breakfast. 11/26/21   Volney American, PA-C  promethazine-dextromethorphan (PROMETHAZINE-DM) 6.25-15  MG/5ML syrup Take 5 mLs by mouth 4 (four) times daily as needed. 11/26/21   Volney American, PA-C  sildenafil (REVATIO) 20 MG tablet TAKE 3-5 TABLETS 30 MINS PRIOR TO INTERCOURSE 03/08/21   Arnoldo Lenis, MD  sildenafil (VIAGRA) 100 MG tablet 1/2 to 1 tablet p.o. as needed 08/01/21   Franchot Gallo, MD  tamsulosin (FLOMAX) 0.4 MG CAPS capsule Take 1 capsule (0.4 mg total) by mouth daily. 10/10/21   Franchot Gallo, MD  warfarin (COUMADIN) 5 MG tablet TAKE 1/2 TABLET BY MOUTH DAILY EXCEPT 1 TAB ON TUESDAYS AS DIRECTED BY COUMADIN CLINIC. 10/23/21   Satira Sark, MD    Allergies    Uloric [febuxostat]  Review of Systems   Review of Systems  Constitutional:  Negative for activity change, chills and fever.  Respiratory:  Positive for cough. Negative for shortness of breath.   Cardiovascular:  Negative for chest pain and palpitations.  Gastrointestinal:  Positive for abdominal pain. Negative for nausea and vomiting.  Genitourinary:  Negative for difficulty urinating and hematuria.  Skin:  Positive for color change (bruising to left flank).  Neurological:  Negative for light-headedness.  All other systems reviewed and are negative.  Physical Exam Updated Vital Signs BP 116/78 (BP Location: Right Arm)    Pulse 86    Temp 97.6 F (36.4 C) (Oral)    Resp 20    SpO2 97%   Physical Exam Vitals and nursing note reviewed.  Constitutional:      General: He is not in acute distress.    Appearance: Normal appearance. He is not ill-appearing.  HENT:     Head: Normocephalic and atraumatic.     Nose: Nose normal.  Eyes:     General: No scleral icterus.    Extraocular Movements: Extraocular movements intact.     Conjunctiva/sclera: Conjunctivae normal.  Cardiovascular:     Rate and Rhythm: Normal rate.  Pulmonary:     Effort: Pulmonary effort is normal. No respiratory distress.     Breath sounds: Normal breath sounds. No wheezing or rales.  Abdominal:     General: There  is distension.     Tenderness: There is abdominal tenderness. There is left CVA tenderness. There is no right CVA tenderness or guarding.  Musculoskeletal:        General: Normal range of motion.     Cervical back: Normal range of motion.     Right lower leg: Edema (trace edema) present.     Left lower leg: Edema (trace edema) present.  Skin:    General: Skin is warm and dry.  Neurological:     General: No focal deficit present.     Mental Status: He is  alert. Mental status is at baseline.    ED Results / Procedures / Treatments   Labs (all labs ordered are listed, but only abnormal results are displayed) Labs Reviewed - No data to display  EKG None  Radiology No results found.  Procedures Procedures   Medications Ordered in ED Medications - No data to display  ED Course  I have reviewed the triage vital signs and the nursing notes.  Pertinent labs & imaging results that were available during my care of the patient were reviewed by me and considered in my medical decision making (see chart for details).  Clinical Course as of 11/30/21 1728  Thu Nov 30, 2021  1333 Creatinine(!): 1.80 [AA]    Clinical Course User Index [AA] Evlyn Courier, PA-C   MDM Rules/Calculators/A&P                         41-year-old male with history of solitary kidney presents today for evaluation of left-sided abdominal pain, flank pain, bruising over his left flank following a coughing spell last night.  Abdomen distended with mild tenderness palpation otherwise benign.  We will start with basic labs including INR and assess kidney function before proceeding with imaging.  CT abdomen pelvis without contrast significant for 500 cc intramuscular hematoma as well as mild to moderate pleural effusion unsure if this is a hemothorax.  Given patient is on Coumadin and symptoms presented last night will discuss with hospitalist for potential admission for obs and monitoring of his pleural effusion and  hematoma.  Discussed with hospitalist will evaluate patient for admission.     Final Clinical Impression(s) / ED Diagnoses Final diagnoses:  Pleural effusion  Intramuscular hematoma    Rx / DC Orders ED Discharge Orders     None        Evlyn Courier, PA-C 11/30/21 1743    Long, Wonda [facility], MD 12/05/21 [phone]

## 2021-11-30 NOTE — ED Provider Notes (Addendum)
[facility]    CSN: [id number] Arrival date & time: 11/30/21  7408      History   Chief Complaint Chief Complaint  Patient presents with   Abdominal Pain    Bruising over abdomen   HPI Dillon Strickland is a 70 y.o. male.   Patient presenting today with 1 day history of significant pain, swelling, bruising over the left flank and abdomen after an intense coughing fit yesterday.  He states the area is very sore, firm but has not worsened since onset.  He denies nausea, vomiting, bowel changes, melena, hematuria.  He was seen several days ago and treated with steroids, albuterol, cough syrup for significant bronchitis that have been ongoing for over a week and states he is overall feeling better with regard to that illness.  His wife just tested positive for COVID.  Of note, he is on Coumadin for chronic atrial fibrillation.  He states he stopped his Coumadin 2 days ago because he was taking so many over-the-counter pain relievers for his illness and knows that they thin the blood as well.  Apart from the bruising and distention of his left side, he has not noted any other bleeding or bruising issues.   Past Medical History:  Diagnosis Date   Chronic anticoagulation    Colonoscopy with polypectomy in 2006; due for a repeat procedure as of 2012   Chronic atrial fibrillation ([place]) 2001   Spontaneous contrast on TEE   Degenerative joint disease of spine    Mild; cervical and lumbosacral   Gout    Hyperlipidemia    Overweight(278.02)    Pseudogout     Patient Active Problem List   Diagnosis Date Noted   Acute renal failure ([place]) 04/28/2014   ARF (acute renal failure) ([place]) 04/27/2014   Laboratory test 02/05/2012   Gout 02/05/2012   Chronic atrial fibrillation (HCC)    Chronic anticoagulation    Hyperlipidemia     Past Surgical History:  Procedure Laterality Date   COLONOSCOPY N/A 04/23/2013   Procedure: COLONOSCOPY;  Surgeon: Rogene Houston, MD;  Location: [facility];  Service: Endoscopy;  Laterality: N/A;  1030-rescheduled to 63 Ann notified pt   COLONOSCOPY W/ POLYPECTOMY  2006     Home Medications    Prior to Admission medications   Medication Sig Start Date End Date Taking? Authorizing Provider  warfarin (COUMADIN) 5 MG tablet TAKE 1/2 TABLET BY MOUTH DAILY EXCEPT 1 TAB ON TUESDAYS AS DIRECTED BY COUMADIN CLINIC. 10/23/21  Yes Satira Sark, MD  allopurinol (ZYLOPRIM) 100 MG tablet Take 300 mg by mouth.    [provider]  allopurinol (ZYLOPRIM) 300 MG tablet Take 300 mg by mouth daily. 07/21/21   [provider]  atenolol (TENORMIN) 50 MG tablet TAKE ONE TABLET BY MOUTH DAILY. 09/15/21   Arnoldo Lenis, MD  atorvastatin (LIPITOR) 20 MG tablet Take 20 mg by mouth daily.    [provider]  benzonatate (TESSALON) 200 MG capsule Take 200 mg by mouth 3 (three) times daily. 11/20/21   [provider]  diltiazem (CARTIA XT) 240 MG 24 hr capsule Take 1 capsule (240 mg total) by mouth daily. 08/25/14   Ward, Delice Bison, DO  escitalopram (LEXAPRO) 10 MG tablet Take 10 mg by mouth daily.    [provider]  lisinopril (ZESTRIL) 2.5 MG tablet Take 2.5 mg by mouth daily. 09/06/21   [provider]  predniSONE (DELTASONE) 20 MG tablet Take 2 tablets (  40 mg total) by mouth daily with breakfast. 11/26/21   Volney American, PA-C  promethazine-dextromethorphan (PROMETHAZINE-DM) 6.25-15 MG/5ML syrup Take 5 mLs by mouth 4 (four) times daily as needed. 11/26/21   Volney American, PA-C  sildenafil (REVATIO) 20 MG tablet TAKE 3-5 TABLETS 30 MINS PRIOR TO INTERCOURSE 03/08/21   Arnoldo Lenis, MD  sildenafil (VIAGRA) 100 MG tablet 1/2 to 1 tablet p.o. as needed 08/01/21   Franchot Gallo, MD  tamsulosin (FLOMAX) 0.4 MG CAPS capsule Take 1 capsule (0.4 mg total) by mouth daily. 10/10/21   Franchot Gallo, MD    Family History Family History  Problem Relation Age of Onset   Stroke  Mother    Cancer Mother    Heart attack Father    Arrhythmia Sister        Atrial fibrillation   Arrhythmia Brother    Arrhythmia Brother    Diabetes Other    Heart attack Other    Colon cancer Neg Hx     Social History Social History   Tobacco Use   Smoking status: Never   Smokeless tobacco: Never  Vaping Use   Vaping Use: Never used  Substance Use Topics   Alcohol use: No    Comment: occasional beer, glass of wine/month - denies at this time 05/18/14   Drug use: No     Allergies   Uloric [febuxostat]   Review of Systems Review of Systems Per HPI  Physical Exam Triage Vital Signs ED Triage Vitals  Enc Vitals Group     BP 11/30/21 1036 96/67     Pulse Rate 11/30/21 1036 85     Resp 11/30/21 1036 20     Temp 11/30/21 1036 98.7 F (37.1 C)     Temp Source 11/30/21 1036 Oral     SpO2 11/30/21 1036 94 %     Weight --      Height --      Head Circumference --      Peak Flow --      Pain Score 11/30/21 1037 7     Pain Loc --      Pain Edu? --      Excl. in [place]? --    No data found.  Updated Vital Signs BP 113/79    Pulse 85    Temp 98.7 F (37.1 C) (Oral)    Resp 20    SpO2 94%   Visual Acuity Right Eye Distance:   Left Eye Distance:   Bilateral Distance:    Right Eye Near:   Left Eye Near:    Bilateral Near:     Physical Exam Vitals and nursing note reviewed.  Constitutional:      Appearance: Normal appearance.  HENT:     Head: Atraumatic.  Eyes:     Extraocular Movements: Extraocular movements intact.     Conjunctiva/sclera: Conjunctivae normal.  Cardiovascular:     Rate and Rhythm: Normal rate and regular rhythm.  Pulmonary:     Effort: Pulmonary effort is normal.     Breath sounds: Normal breath sounds.  Abdominal:     General: Bowel sounds are normal. There is distension.     Tenderness: There is abdominal tenderness. There is guarding.     Comments: Significant distention left upper abdomen extending down left flank, bruising,  swelling in this area additionally.  Musculoskeletal:        General: Normal range of motion.     Cervical back: Normal range of motion  and neck supple.  Skin:    General: Skin is warm and dry.     Findings: Bruising present.  Neurological:     General: No focal deficit present.     Mental Status: He is oriented to person, place, and time.  Psychiatric:        Mood and Affect: Mood normal.        Thought Content: Thought content normal.        Judgment: Judgment normal.   UC Treatments / Results  Labs (all labs ordered are listed, but only abnormal results are displayed) Labs Reviewed - No data to display  EKG   Radiology No results found.  Procedures Procedures (including critical care time)  Medications Ordered in UC Medications - No data to display  Initial Impression / Assessment and Plan / UC Course  I have reviewed the triage vital signs and the nursing notes.  Pertinent labs & imaging results that were available during my care of the patient were reviewed by me and considered in my medical decision making (see chart for details).     Possibly hematoma from muscle strain from coughing, but given the extent of distention, bruising, pain in the left flank and abdomen as well as his chronic anticoagulation on Coumadin recommended that he go to the emergency department for immediate labs, possibly some imaging for safety check.  Discussed that we are unable to obtain INR, CBC or other necessary labs immediately in the setting or perform any sort of abdominal imaging.  Discussed this case with Dr. Mannie Stabile who agrees that the emergency department is the best course of action to rule out more emergent causes of his symptoms.  Patient is agreeable to this plan and wishes to go via private vehicle to the emergency department for further evaluation.  He is currently hemodynamically stable for private vehicle transport.  Final Clinical Impressions(s) / UC Diagnoses   Final  diagnoses:  Abdominal wall hematoma, initial encounter  Chronic anticoagulation  Acute cough   Discharge Instructions   None    ED Prescriptions   None    PDMP not reviewed this encounter.   Volney American, [place] 11/30/21 [place], [place], [place] 11/30/21 1136

## 2021-11-30 NOTE — ED Triage Notes (Signed)
PT has bruising and swelling over left side of abdomen. No injury. Notes that he has been forcefully coughing and noticed immediate pain while coughing yesterday.

## 2021-12-01 ENCOUNTER — Observation Stay (HOSPITAL_COMMUNITY): Payer: PPO

## 2021-12-01 ENCOUNTER — Encounter (HOSPITAL_COMMUNITY): Payer: Self-pay | Admitting: Internal Medicine

## 2021-12-01 DIAGNOSIS — M109 Gout, unspecified: Secondary | ICD-10-CM | POA: Diagnosis present

## 2021-12-01 DIAGNOSIS — E782 Mixed hyperlipidemia: Secondary | ICD-10-CM | POA: Diagnosis not present

## 2021-12-01 DIAGNOSIS — D6832 Hemorrhagic disorder due to extrinsic circulating anticoagulants: Secondary | ICD-10-CM | POA: Diagnosis present

## 2021-12-01 DIAGNOSIS — Z20822 Contact with and (suspected) exposure to covid-19: Secondary | ICD-10-CM | POA: Diagnosis present

## 2021-12-01 DIAGNOSIS — I129 Hypertensive chronic kidney disease with stage 1 through stage 4 chronic kidney disease, or unspecified chronic kidney disease: Secondary | ICD-10-CM | POA: Diagnosis present

## 2021-12-01 DIAGNOSIS — Z888 Allergy status to other drugs, medicaments and biological substances status: Secondary | ICD-10-CM | POA: Diagnosis not present

## 2021-12-01 DIAGNOSIS — M47812 Spondylosis without myelopathy or radiculopathy, cervical region: Secondary | ICD-10-CM | POA: Diagnosis present

## 2021-12-01 DIAGNOSIS — Z823 Family history of stroke: Secondary | ICD-10-CM | POA: Diagnosis not present

## 2021-12-01 DIAGNOSIS — M47817 Spondylosis without myelopathy or radiculopathy, lumbosacral region: Secondary | ICD-10-CM | POA: Diagnosis present

## 2021-12-01 DIAGNOSIS — Z7901 Long term (current) use of anticoagulants: Secondary | ICD-10-CM | POA: Diagnosis not present

## 2021-12-01 DIAGNOSIS — I482 Chronic atrial fibrillation, unspecified: Secondary | ICD-10-CM | POA: Diagnosis present

## 2021-12-01 DIAGNOSIS — M7989 Other specified soft tissue disorders: Secondary | ICD-10-CM | POA: Diagnosis not present

## 2021-12-01 DIAGNOSIS — N1832 Chronic kidney disease, stage 3b: Secondary | ICD-10-CM | POA: Diagnosis present

## 2021-12-01 DIAGNOSIS — S301XXA Contusion of abdominal wall, initial encounter: Secondary | ICD-10-CM | POA: Diagnosis not present

## 2021-12-01 DIAGNOSIS — M7981 Nontraumatic hematoma of soft tissue: Secondary | ICD-10-CM | POA: Diagnosis present

## 2021-12-01 DIAGNOSIS — Z8249 Family history of ischemic heart disease and other diseases of the circulatory system: Secondary | ICD-10-CM | POA: Diagnosis not present

## 2021-12-01 DIAGNOSIS — R109 Unspecified abdominal pain: Secondary | ICD-10-CM | POA: Diagnosis present

## 2021-12-01 DIAGNOSIS — T45515A Adverse effect of anticoagulants, initial encounter: Secondary | ICD-10-CM | POA: Diagnosis present

## 2021-12-01 DIAGNOSIS — N261 Atrophy of kidney (terminal): Secondary | ICD-10-CM | POA: Diagnosis not present

## 2021-12-01 DIAGNOSIS — J90 Pleural effusion, not elsewhere classified: Secondary | ICD-10-CM | POA: Diagnosis not present

## 2021-12-01 DIAGNOSIS — Z809 Family history of malignant neoplasm, unspecified: Secondary | ICD-10-CM | POA: Diagnosis not present

## 2021-12-01 DIAGNOSIS — E785 Hyperlipidemia, unspecified: Secondary | ICD-10-CM | POA: Diagnosis present

## 2021-12-01 DIAGNOSIS — I878 Other specified disorders of veins: Secondary | ICD-10-CM | POA: Diagnosis not present

## 2021-12-01 LAB — COMPREHENSIVE METABOLIC PANEL
ALT: 20 U/L (ref 0–44)
AST: 22 U/L (ref 15–41)
Albumin: 3.1 g/dL — ABNORMAL LOW (ref 3.5–5.0)
Alkaline Phosphatase: 58 U/L (ref 38–126)
Anion gap: 8 (ref 5–15)
BUN: 38 mg/dL — ABNORMAL HIGH (ref 8–23)
CO2: 25 mmol/L (ref 22–32)
Calcium: 8.2 mg/dL — ABNORMAL LOW (ref 8.9–10.3)
Chloride: 105 mmol/L (ref 98–111)
Creatinine, Ser: 1.44 mg/dL — ABNORMAL HIGH (ref 0.61–1.24)
GFR, Estimated: 52 mL/min — ABNORMAL LOW (ref >60)
Glucose, Bld: 116 mg/dL — ABNORMAL HIGH (ref 70–99)
Potassium: 4.8 mmol/L (ref 3.5–5.1)
Sodium: 138 mmol/L (ref 135–145)
Total Bilirubin: 1.4 mg/dL — ABNORMAL HIGH (ref 0.3–1.2)
Total Protein: 5.7 g/dL — ABNORMAL LOW (ref 6.5–8.1)

## 2021-12-01 LAB — HEMOGLOBIN AND HEMATOCRIT, BLOOD
HCT: 33.5 % — ABNORMAL LOW (ref 39.0–52.0)
Hemoglobin: 10.9 g/dL — ABNORMAL LOW (ref 13.0–17.0)

## 2021-12-01 LAB — CBC
HCT: 35.3 % — ABNORMAL LOW (ref 39.0–52.0)
Hemoglobin: 11.2 g/dL — ABNORMAL LOW (ref 13.0–17.0)
MCH: 30.1 pg (ref 26.0–34.0)
MCHC: 31.7 g/dL (ref 30.0–36.0)
MCV: 94.9 fL (ref 80.0–100.0)
Platelets: 210 10*3/uL (ref 150–400)
RBC: 3.72 MIL/uL — ABNORMAL LOW (ref 4.22–5.81)
RDW: 15.3 % (ref 11.5–15.5)
WBC: 14.2 10*3/uL — ABNORMAL HIGH (ref 4.0–10.5)
nRBC: 0 % (ref 0.0–0.2)

## 2021-12-01 LAB — PROTIME-INR
INR: 1.6 — ABNORMAL HIGH (ref 0.8–1.2)
Prothrombin Time: 19.4 seconds — ABNORMAL HIGH (ref 11.4–15.2)

## 2021-12-01 LAB — CBG MONITORING, ED: Glucose-Capillary: 107 mg/dL — ABNORMAL HIGH (ref 70–99)

## 2021-12-01 LAB — HIV ANTIBODY (ROUTINE TESTING W REFLEX): HIV Screen 4th Generation wRfx: NONREACTIVE

## 2021-12-01 LAB — LACTATE DEHYDROGENASE: LDH: 133 U/L (ref 98–192)

## 2021-12-01 MED ORDER — SENNOSIDES-DOCUSATE SODIUM 8.6-50 MG PO TABS: 1.0000 | ORAL_TABLET | Freq: Every evening | ORAL | Status: DC | PRN

## 2021-12-01 MED ORDER — PROMETHAZINE-PHENYLEPHRINE 6.25-5 MG/5ML PO SYRP: 5.0000 mL | ORAL_SOLUTION | ORAL | Status: DC | PRN

## 2021-12-01 MED ORDER — METOPROLOL TARTRATE 5 MG/5ML IV SOLN: 5.0000 mg | INTRAVENOUS | Status: DC | PRN

## 2021-12-01 MED ORDER — DM-GUAIFENESIN ER 30-600 MG PO TB12
1.0000 | ORAL_TABLET | Freq: Two times a day (BID) | ORAL | Status: DC | PRN
  Administered 2021-12-01 – 2021-12-02 (×3): 1 via ORAL
  Filled 2021-12-01 (×3): qty 1

## 2021-12-01 MED ORDER — HYDRALAZINE HCL 20 MG/ML IJ SOLN: 10.0000 mg | INTRAMUSCULAR | Status: DC | PRN

## 2021-12-01 MED ORDER — IPRATROPIUM-ALBUTEROL 0.5-2.5 (3) MG/3ML IN SOLN: 3.0000 mL | RESPIRATORY_TRACT | Status: DC | PRN

## 2021-12-01 MED ORDER — OXYCODONE HCL 5 MG PO TABS
5.0000 mg | ORAL_TABLET | ORAL | Status: DC | PRN
  Administered 2021-12-02 (×2): 5 mg via ORAL
  Filled 2021-12-01 (×3): qty 1

## 2021-12-01 NOTE — Progress Notes (Signed)
PROGRESS NOTE    Dillon Strickland  ASN:[id number] DOB: [birth year] DOA: 11/30/2021 PCP: Asencion Noble, MD   Brief Narrative:  70 year old with history of A. fib Coumadin, hereditary solitary kidney, CKD stage IIIb, HTN comes to the hospital with complains of left-sided hip pain and URI type symptoms.  CT of the abdomen showed left-sided intramuscular hematoma in his abdomen, about 400 cc.  His INR was slightly supratherapeutic requiring vitamin K.   Assessment & Plan:   Principal Problem:   Abdominal wall hematoma Active Problems:   Chronic atrial fibrillation (HCC)   Chronic anticoagulation   Hyperlipidemia   Gout   Abdominal wall hematoma -Suspect from coughing and sneezing.  Spontaneous hematoma.  No trauma.  Abdominal binder semaglutide.  Pain control, Coumadin/other anticoagulation has been on hold.  Continue to monitor hemoglobin, check CBC later today and tomorrow morning.  If remains stable then he likely will not need any further intervention by IR. -Anticipate improved his Coumadin ( - 14 days - Vitamin K has been given in the ED.  Continue to monitor   Left pleural effusion -Evaluated by radiology, nondiagnostic thoracentesis.  Continue   A. fib/chronic anticoagulation -On Cardizem and atenolol.  Coumadin on hold   URI -Supportive care. Flu/COVID test negative ER treatment   Hyperlipidemia -Continue with home medications   Gout -Continue with allopurinol   Hypertension -Continue with home medication including atenolol and Cardizem, resume lisinopril when stable   CKD stage IIIb/solitary solid kidney; improving.  -Function at baseline, continue to monitor closely and avoid nephrotoxic medications    DVT prophylaxis: SCDs Start: 11/30/21 2050 Code Status: Full  Family Communication:  None   Maintain hospital since patient is on anticoagulation at home.  Large hematoma.  This needs to be monitored closely over the next 24 to respirated if remains stable  can go home.    Subjective: Patient wanted to go home today but I explained to him he needs to stay in the hospital to ongoing anticoagulation Further bleeding.  We need to closely monitor his hemoglobin.  He understands this and is willing to stay.  States abdominal binder has been helping him quite a bit.    Examination:  General exam: Appears calm and comfortable  Respiratory system: Clear to auscultation. Respiratory effort normal. Cardiovascular system: S1 & S2 heard, RRR. No JVD, murmurs, rubs, gallops or clicks. No pedal edema. Gastrointestinal system: Abdomen is nondistended, soft and nontender. No organomegaly or masses felt. Normal bowel sounds heard. Central nervous system: Alert and oriented. No focal neurological deficits. Extremities: Symmetric 5 x 5 power. Skin: Abdominal wall hematoma noted.  Abd binder in place.  Psychiatry: Judgement and insight appear normal. Mood & affect appropriate.     Objective: Vitals:   12/01/21 0400 12/01/21 0700 12/01/21 0730 12/01/21 1037  BP: 104/61 108/65 125/67 122/65  Pulse: 80 83 88 (!) 122  Resp: 16 16 16  (!) 25  Temp:  98 F (36.7 C)    TempSrc:  Oral    SpO2: 93% 95% 96% 95%   No intake or output data in the 24 hours ending 12/01/21 1229 There were no vitals filed for this visit.   Data Reviewed:   CBC: Recent Labs  Lab 11/30/21 1254 12/01/21 0351  WBC 11.7* 14.2*  HGB 12.9* 11.2*  HCT 39.5 35.3*  MCV 95.4 94.9  PLT 219 193   Basic Metabolic Panel: Recent Labs  Lab 11/30/21 1254 12/01/21 0351  NA 134* 138  K 4.5 4.8  CL 105 105  CO2 22 25  GLUCOSE 139* 116*  BUN 38* 38*  CREATININE 1.80* 1.44*  CALCIUM 7.9* 8.2*   GFR: CrCl cannot be calculated (Unknown ideal weight.). Liver Function Tests: Recent Labs  Lab 12/01/21 0351  AST 22  ALT 20  ALKPHOS 58  BILITOT 1.4*  PROT 5.7*  ALBUMIN 3.1*   No results for input(s): LIPASE, AMYLASE in the last 168 hours. No results for input(s): AMMONIA in  the last 168 hours. Coagulation Profile: Recent Labs  Lab 11/30/21 1254 12/01/21 0351  INR 3.1* 1.6*   Cardiac Enzymes: No results for input(s): CKTOTAL, CKMB, CKMBINDEX, TROPONINI in the last 168 hours. BNP (last 3 results) No results for input(s): PROBNP in the last 8760 hours. HbA1C: No results for input(s): HGBA1C in the last 72 hours. CBG: Recent Labs  Lab 12/01/21 0050  GLUCAP 107*   Lipid Profile: No results for input(s): CHOL, HDL, LDLCALC, TRIG, CHOLHDL, LDLDIRECT in the last 72 hours. Thyroid Function Tests: No results for input(s): TSH, T4TOTAL, FREET4, T3FREE, THYROIDAB in the last 72 hours. Anemia Panel: No results for input(s): VITAMINB12, FOLATE, FERRITIN, TIBC, IRON, RETICCTPCT in the last 72 hours. Sepsis Labs: No results for input(s): PROCALCITON, LATICACIDVEN in the last 168 hours.  Recent Results (from the past 240 hour(s))  Resp Panel by RT-PCR (Flu A&B, Covid) Nasopharyngeal Swab     Status: None   Collection Time: 11/30/21  5:05 PM   Specimen: Nasopharyngeal Swab; Nasopharyngeal(NP) swabs in vial transport medium  Result Value Ref Range Status   SARS Coronavirus 2 by RT PCR NEGATIVE NEGATIVE Final    Comment: (NOTE) SARS-CoV-2 target nucleic acids are NOT DETECTED.  The SARS-CoV-2 RNA is generally detectable in upper respiratory specimens during the acute phase of infection. The lowest concentration of SARS-CoV-2 viral copies this assay can detect is 138 copies/mL. A negative result does not preclude SARS-Cov-2 infection and should not be used as the sole basis for treatment or other patient management decisions. A negative result may occur with  improper specimen collection/handling, submission of specimen other than nasopharyngeal swab, presence of viral mutation(s) within the areas targeted by this assay, and inadequate number of viral copies(<138 copies/mL). A negative result must be combined with clinical observations, patient history, and  epidemiological information. The expected result is Negative.  Fact Sheet for Patients:  EntrepreneurPulse.com.au  Fact Sheet for Healthcare Providers:  IncredibleEmployment.be  This test is no t yet approved or cleared by the [place] FDA and  has been authorized for detection and/or diagnosis of SARS-CoV-2 by FDA under an Emergency Use Authorization (EUA). This EUA will remain  in effect (meaning this test can be used) for the duration of the COVID-19 declaration under Section 564(b)(1) of the Act, 21 U.S.C.section 360bbb-3(b)(1), unless the authorization is terminated  or revoked sooner.       Influenza A by PCR NEGATIVE NEGATIVE Final   Influenza B by PCR NEGATIVE NEGATIVE Final    Comment: (NOTE) The Xpert Xpress SARS-CoV-2/FLU/RSV plus assay is intended as an aid in the diagnosis of influenza from Nasopharyngeal swab specimens and should not be used as a sole basis for treatment. Nasal washings and aspirates are unacceptable for Xpert Xpress SARS-CoV-2/FLU/RSV testing.  Fact Sheet for Patients: EntrepreneurPulse.com.au  Fact Sheet for Healthcare Providers: IncredibleEmployment.be  This test is not yet approved or cleared by the [place] FDA and has been authorized for detection and/or diagnosis of SARS-CoV-2 by FDA under an Emergency Use Authorization (EUA). This  EUA will remain in effect (meaning this test can be used) for the duration of the COVID-19 declaration under Section 564(b)(1) of the Act, 21 U.S.C. section 360bbb-3(b)(1), unless the authorization is terminated or revoked.  Performed at [facility], [address] [place], [place] [address]          Radiology Studies: CT ABDOMEN PELVIS WO CONTRAST  Result Date: 11/30/2021 CLINICAL DATA:  LEFT-sided abdominal pain and swelling. EXAM: CT ABDOMEN AND PELVIS WITHOUT CONTRAST TECHNIQUE: Multidetector CT imaging of the  abdomen and pelvis was performed following the standard protocol without IV contrast. COMPARISON:  None. FINDINGS: Lower chest: Moderate size layering LEFT effusion with associated atelectasis of the LEFT lower lobe. Pleural fluid collection is intermediate density. Mild airspace disease surrounds atelectasis. Hepatobiliary: No focal hepatic lesion. No biliary duct dilatation. Common bile duct is normal. Pancreas: Fatty replacement the pancreas. Spleen: Normal spleen Adrenals/urinary tract: Adrenal glands normal. The LEFT kidney is atrophic and malposition in the LEFT upper pelvis. RIGHT kidney is normal noncontrast exam. Ureters and bladder normal. Stomach/Bowel: Stomach, small bowel, appendix, and cecum are normal. The colon and rectosigmoid colon are normal. Vascular/Lymphatic: Abdominal aorta is normal caliber. No periportal or retroperitoneal adenopathy. No pelvic adenopathy. Reproductive: Unremarkable Other: No intraperitoneal free fluid. Musculoskeletal: There is a large ovoid hematoma in the LEFT flank wall extending from the inferior costal margin the level the pelvic brim. Hematoma within the musculature of the LEFT lateral abdominal wall. Hematoma measures 7.9 x 6.9 16.1 cm (volume = 460 cm^3). Smaller hematoma slightly dorsal to the dominant hematoma within inter costal space (image 134/series 6). No associated rib fracture adjacent to the hematoma. No pelvic fracture identified. IMPRESSION: 1. Large (500 cc) intramuscular hematoma along the LEFT abdominal and chest wall. No associated rib fracture identified. 2. Intermediate density LEFT pleural effusion. Cannot exclude hemothorax. Recommend correlation with history of trauma. No rib fracture identified. 3. No evidence of splenic injury. 4. Atrophic mal positioned LEFT kidney. Electronically Signed   By: Suzy Bouchard M.D.   On: 11/30/2021 16:02   DG Chest 2 View  Result Date: 11/30/2021 CLINICAL DATA:  Left pleural effusion EXAM: CHEST - 2 VIEW  COMPARISON:  CT abdomen 11/30/2021 FINDINGS: Small left pleural effusion. Left basilar atelectasis. No right pleural effusion. No pneumothorax. Heart and mediastinal contours are unremarkable. No acute osseous abnormality. IMPRESSION: Small left pleural effusion with left basilar atelectasis. Electronically Signed   By: Kathreen Devoid M.D.   On: 11/30/2021 17:18   [place] CHEST (PLEURAL EFFUSION)  Result Date: 12/01/2021 CLINICAL DATA:  Possible thoracentesis EXAM: CHEST ULTRASOUND COMPARISON:  Chest x-ray 11/30/2021 FINDINGS: Only a small left pleural effusion is seen by ultrasound, not sufficient to tap at this time. IMPRESSION: Small left pleural effusion, not sufficient to tap. Electronically Signed   By: Rolm Baptise M.D.   On: 12/01/2021 09:50        Scheduled Meds:  allopurinol  300 mg Oral Daily   atenolol  50 mg Oral Daily   atorvastatin  20 mg Oral Daily   diltiazem  240 mg Oral Daily   tamsulosin  0.4 mg Oral Daily   Continuous Infusions:   LOS: 0 days   Time spent= 35 mins    Nhyira Leano Arsenio Loader, MD [facility]  If 7PM-7AM, please contact night-coverage  12/01/2021, 12:29 PM

## 2021-12-01 NOTE — ED Notes (Signed)
Pt states he feels much better and wants to go home and take care of his wife. Pt states he does not feel that he needs to stay here. Hospitalist notified.

## 2021-12-01 NOTE — Progress Notes (Addendum)
° °  Limited US Chest performed at bedside Very small left pleural effusion identified  Pt is in NO distress Denies SOB; afeb 96-97% On room air; breathing easily Speaking in clear full sentences  NO Thoracentesis performed today

## 2021-12-01 NOTE — Progress Notes (Signed)
°  Transition of Care [facility]) Screening Note   Patient Details  Name: Dillon Strickland Date of Birth: 05-Jul-1951   Transition of Care [facility]) CM/SW Contact:    Ihor Gully, LCSW Phone Number: 12/01/2021, 4:31 PM    Transition of Care Department [facility]) has reviewed patient and no TOC needs have been identified at this time. We will continue to monitor patient advancement through interdisciplinary progression rounds. If new patient transition needs arise, please place a TOC consult.  Aalijah Mims, Clydene Pugh, LCSW

## 2021-12-02 ENCOUNTER — Inpatient Hospital Stay (HOSPITAL_COMMUNITY): Payer: PPO

## 2021-12-02 DIAGNOSIS — N261 Atrophy of kidney (terminal): Secondary | ICD-10-CM | POA: Diagnosis not present

## 2021-12-02 DIAGNOSIS — I878 Other specified disorders of veins: Secondary | ICD-10-CM | POA: Diagnosis not present

## 2021-12-02 DIAGNOSIS — M7989 Other specified soft tissue disorders: Secondary | ICD-10-CM | POA: Diagnosis not present

## 2021-12-02 DIAGNOSIS — S301XXA Contusion of abdominal wall, initial encounter: Secondary | ICD-10-CM | POA: Diagnosis not present

## 2021-12-02 LAB — CBC
HCT: 29.8 % — ABNORMAL LOW (ref 39.0–52.0)
Hemoglobin: 9.8 g/dL — ABNORMAL LOW (ref 13.0–17.0)
MCH: 30.7 pg (ref 26.0–34.0)
MCHC: 32.9 g/dL (ref 30.0–36.0)
MCV: 93.4 fL (ref 80.0–100.0)
Platelets: 198 10*3/uL (ref 150–400)
RBC: 3.19 MIL/uL — ABNORMAL LOW (ref 4.22–5.81)
RDW: 15 % (ref 11.5–15.5)
WBC: 13.2 10*3/uL — ABNORMAL HIGH (ref 4.0–10.5)
nRBC: 0.2 % (ref 0.0–0.2)

## 2021-12-02 LAB — BASIC METABOLIC PANEL
Anion gap: 6 (ref 5–15)
BUN: 43 mg/dL — ABNORMAL HIGH (ref 8–23)
CO2: 25 mmol/L (ref 22–32)
Calcium: 8.2 mg/dL — ABNORMAL LOW (ref 8.9–10.3)
Chloride: 105 mmol/L (ref 98–111)
Creatinine, Ser: 1.71 mg/dL — ABNORMAL HIGH (ref 0.61–1.24)
GFR, Estimated: 43 mL/min — ABNORMAL LOW (ref >60)
Glucose, Bld: 111 mg/dL — ABNORMAL HIGH (ref 70–99)
Potassium: 4.3 mmol/L (ref 3.5–5.1)
Sodium: 136 mmol/L (ref 135–145)

## 2021-12-02 LAB — MAGNESIUM: Magnesium: 2.1 mg/dL (ref 1.7–2.4)

## 2021-12-02 MED ORDER — SENNOSIDES-DOCUSATE SODIUM 8.6-50 MG PO TABS
1.0000 | ORAL_TABLET | Freq: Two times a day (BID) | ORAL | 0 refills | Status: DC | PRN
Start: 2021-12-02 — End: 2023-04-22

## 2021-12-02 MED ORDER — OXYCODONE HCL 5 MG PO TABS: 5.0000 mg | ORAL_TABLET | ORAL | 0 refills | Status: DC | PRN

## 2021-12-02 NOTE — Discharge Summary (Signed)
Physician Discharge Summary  SAVEON PLANT ZOX:[id number] DOB: 06-Jul-1951 DOA: 11/30/2021  PCP: Asencion Noble, MD  Admit date: 11/30/2021 Discharge date: 12/02/2021  Admitted From: Home Disposition: Home  Recommendations for Outpatient Follow-up:  Follow up with PCP in 1-2 weeks Please obtain BMP/CBC in one week your next doctors visit.  Hold Coumadin minimum for 2 weeks.  Needs to follow-up with PCP in the meantime for further instructions Pain medication with bowel regimen prescribed   Discharge Condition: Stable CODE STATUS: Full code Diet recommendation: Heart healthy  Brief/Interim Summary: 70 year old with history of A. fib Coumadin, hereditary solitary kidney, CKD stage IIIb, HTN comes to the hospital with complains of left-sided hip pain and URI type symptoms.  CT of the abdomen showed left-sided intramuscular hematoma in his abdomen, about 400 cc.  His INR was slightly supratherapeutic requiring vitamin K.  During the hospitalization patient's abdominal discomfort resolved.  Abdominal binder was placed.  Repeat CT scan on the day of discharge showed stable hematoma but slight tracking towards left inguinal area which is as expected. He has been advised to hold Coumadin for minimum of 2 weeks.  He understands that during this time he may have a slightly higher risk of stroke given his history of atrial fibrillation. Left-sided pleural effusion was evaluated by IR but not enough to perform thoracentesis.  Breathing remained stable. Stable for discharge with outpatient follow-up recommendations as stated above.     Assessment & Plan:   Principal Problem:   Abdominal wall hematoma Active Problems:   Chronic atrial fibrillation (HCC)   Chronic anticoagulation   Hyperlipidemia   Gout     Abdominal wall hematoma -Suspect from coughing and sneezing.  Spontaneous hematoma.  No trauma.  Abdominal binder is in place.  Initial CT showed abdominal wall hematoma about 400 cc,  repeat CT scan showed stable hematoma slightly tracking towards left inguinal which is expected.  He has been advised to hold Coumadin for minimum of 2 weeks but in the meantime will need to follow-up with PCP and get repeat lab work.  No evidence of infection noted - Vitamin K has been given in the ED.     Left pleural effusion, small - Seen by IR, not enough to obtain any kind of fluid.  He is not in any respiratory distress showed no short of breath.   A. fib/chronic anticoagulation -On Cardizem and atenolol.  Coumadin on hold for minimum of 2 weeks.  Follow-up outpatient with PCP and get repeat CBC in about 1 week.  He understands during this time he is at high risk for stroke   URI -Supportive care. Flu/COVID test negative ER treatment   Hyperlipidemia -Continue with home medications   Gout -Continue with allopurinol   Hypertension -Continue with home medication including atenolol and Cardizem, resume lisinopril when stable   CKD stage IIIb/solitary solid kidney; improving.  -Function at baseline, continue to monitor closely and avoid nephrotoxic medications      Body mass index is 37.54 kg/m.         Discharge Diagnoses:  Principal Problem:   Abdominal wall hematoma Active Problems:   Chronic atrial fibrillation (HCC)   Chronic anticoagulation   Hyperlipidemia   Gout      Consultations: None  Subjective: Feels great wants to go home today.  Discharge Exam: Vitals:   12/02/21 0504 12/02/21 0830  BP: 102/68 113/75  Pulse: 82 89  Resp: 16   Temp: 98.1 F (36.7 C)   SpO2: 92% 96%  Vitals:   12/01/21 2106 12/02/21 0058 12/02/21 0504 12/02/21 0830  BP: 98/66 107/66 102/68 113/75  Pulse: 74 79 82 89  Resp: 16 16 16    Temp: 98.4 F (36.9 C) 98.6 F (37 C) 98.1 F (36.7 C)   TempSrc: Oral Oral Oral   SpO2: 97% 92% 92% 96%  Weight:      Height:        General: Pt is alert, awake, not in acute distress Cardiovascular: RRR, S1/S2 +, no rubs,  no gallops Respiratory: CTA bilaterally, no wheezing, no rhonchi Abdominal: Soft, NT, ND, bowel sounds +.  Abdominal binder in place.  Some ecchymosis noted over his left abdomen as expected Extremities: no edema, no cyanosis  Discharge Instructions   Allergies as of 12/02/2021       Reactions   Uloric [febuxostat] Rash        Medication List     TAKE these medications    allopurinol 100 MG tablet Commonly known as: ZYLOPRIM Take 300 mg by mouth.   allopurinol 300 MG tablet Commonly known as: ZYLOPRIM Take 300 mg by mouth daily.   aspirin EC 81 MG tablet Take 162 mg by mouth daily. Swallow whole.   atenolol 50 MG tablet Commonly known as: TENORMIN TAKE ONE TABLET BY MOUTH DAILY.   atorvastatin 20 MG tablet Commonly known as: LIPITOR Take 20 mg by mouth daily.   benzonatate 200 MG capsule Commonly known as: TESSALON Take 200 mg by mouth 3 (three) times daily.   diltiazem 240 MG 24 hr capsule Commonly known as: Cartia XT Take 1 capsule (240 mg total) by mouth daily.   escitalopram 10 MG tablet Commonly known as: LEXAPRO Take 10 mg by mouth daily.   escitalopram 20 MG tablet Commonly known as: LEXAPRO Take 20 mg by mouth daily.   lisinopril 2.5 MG tablet Commonly known as: ZESTRIL Take 2.5 mg by mouth daily.   oxyCODONE 5 MG immediate release tablet Commonly known as: Oxy IR/ROXICODONE Take 1 tablet (5 mg total) by mouth every 4 (four) hours as needed for moderate pain or severe pain.   predniSONE 20 MG tablet Commonly known as: DELTASONE Take 2 tablets (40 mg total) by mouth daily with breakfast.   Promethazine HCl 6.25 MG/5ML Soln Take 5 mLs by mouth every 4 (four) hours as needed.   promethazine-dextromethorphan 6.25-15 MG/5ML syrup Commonly known as: PROMETHAZINE-DM Take 5 mLs by mouth 4 (four) times daily as needed.   senna-docusate 8.6-50 MG tablet Commonly known as: Senokot-S Take 1 tablet by mouth 2 (two) times daily as needed for  moderate constipation.   sildenafil 100 MG tablet Commonly known as: VIAGRA 1/2 to 1 tablet p.o. as needed   sildenafil 20 MG tablet Commonly known as: REVATIO TAKE 3-5 TABLETS 30 MINS PRIOR TO INTERCOURSE   tamsulosin 0.4 MG Caps capsule Commonly known as: FLOMAX Take 1 capsule (0.4 mg total) by mouth daily.   warfarin 5 MG tablet Commonly known as: COUMADIN Take as directed. If you are unsure how to take this medication, talk to your nurse or doctor. Original instructions: TAKE 1/2 TABLET BY MOUTH DAILY EXCEPT 1 TAB ON TUESDAYS AS DIRECTED BY COUMADIN CLINIC.        Follow-up Information     Asencion Noble, MD Follow up in 1 week(s).   Specialty: Internal Medicine Contact information: [address] [place] [address] [phone]         Arnoldo Lenis, MD .   Specialty: Cardiology  Contact information: [address] [place] [address] [phone]                Allergies  Allergen Reactions   Uloric [Febuxostat] Rash    You were cared for by a hospitalist during your hospital stay. If you have any questions about your discharge medications or the care you received while you were in the hospital after you are discharged, you can call the unit and asked to speak with the hospitalist on call if the hospitalist that took care of you is not available. Once you are discharged, your primary care physician will handle any further medical issues. Please note that no refills for any discharge medications will be authorized once you are discharged, as it is imperative that you return to your primary care physician (or establish a relationship with a primary care physician if you do not have one) for your aftercare needs so that they can reassess your need for medications and monitor your lab values.   Procedures/Studies: CT ABDOMEN PELVIS WO CONTRAST  Result Date: 12/02/2021 CLINICAL DATA:  70 year old male with abdominal pain. Spontaneous  abdominal wall hematoma. EXAM: CT ABDOMEN AND PELVIS WITHOUT CONTRAST TECHNIQUE: Multidetector CT imaging of the abdomen and pelvis was performed following the standard protocol without IV contrast. COMPARISON:  Noncontrast CT Abdomen and Pelvis 11/30/2021. FINDINGS: Lower chest: Low-density layering left pleural effusion is unchanged with compressive left lung base atelectasis. No pericardial effusion. Mild superimposed lower lobe scarring. Hepatobiliary: Stable and negative noncontrast liver and gallbladder. Pancreas: Atrophied. Spleen: Stable and negative noncontrast spleen. Adrenals/Urinary Tract: Stable orthotopic right kidney. Normal adrenal glands. No retroperitoneal hematoma. Partially atrophied left pelvic kidney, at the pelvic inlet on series 2, image 62 is stable. Unremarkable bladder. Incidental pelvic phleboliths. Stomach/Bowel: Stable and negative aside from redundant large bowel. No free air or free fluid. Vascular/Lymphatic: Mild Aortoiliac calcified atherosclerosis. Normal caliber abdominal aorta no lymphadenopathy. Reproductive: Negative. Other: No pelvic free fluid. Musculoskeletal: No acute osseous abnormality identified. Osteopenia. Large mixed density left oblique abdominal intramuscular hematoma with surrounding inflammation appears stable encompassing 99 x 63 x 170 mm (AP by transverse by CC) estimated confluent blood volume 530 mL. Regional body wall swelling has not significantly changed, but the subcutaneous stranding extends farther caudal from the hematoma now to the left inguinal region (series 2, image 79). IMPRESSION: 1. Stable large volume intramuscular hematoma of the left oblique abdominal muscle, although tracking subcutaneous blood products or inflammation extends farther caudal now to the left inguinal region. 2. Left pleural effusion unchanged and has simple fluid density today, favoring transudate over hemothorax. 3. No new abnormality identified in the noncontrast abdomen or  pelvis. Partially atrophied left pelvic kidney. Aortic Atherosclerosis (ICD10-I70.0). Electronically Signed   By: Genevie Ann M.D.   On: 12/02/2021 10:12   CT ABDOMEN PELVIS WO CONTRAST  Result Date: 11/30/2021 CLINICAL DATA:  LEFT-sided abdominal pain and swelling. EXAM: CT ABDOMEN AND PELVIS WITHOUT CONTRAST TECHNIQUE: Multidetector CT imaging of the abdomen and pelvis was performed following the standard protocol without IV contrast. COMPARISON:  None. FINDINGS: Lower chest: Moderate size layering LEFT effusion with associated atelectasis of the LEFT lower lobe. Pleural fluid collection is intermediate density. Mild airspace disease surrounds atelectasis. Hepatobiliary: No focal hepatic lesion. No biliary duct dilatation. Common bile duct is normal. Pancreas: Fatty replacement the pancreas. Spleen: Normal spleen Adrenals/urinary tract: Adrenal glands normal. The LEFT kidney is atrophic and malposition in the LEFT upper pelvis. RIGHT kidney is normal noncontrast exam. Ureters  and bladder normal. Stomach/Bowel: Stomach, small bowel, appendix, and cecum are normal. The colon and rectosigmoid colon are normal. Vascular/Lymphatic: Abdominal aorta is normal caliber. No periportal or retroperitoneal adenopathy. No pelvic adenopathy. Reproductive: Unremarkable Other: No intraperitoneal free fluid. Musculoskeletal: There is a large ovoid hematoma in the LEFT flank wall extending from the inferior costal margin the level the pelvic brim. Hematoma within the musculature of the LEFT lateral abdominal wall. Hematoma measures 7.9 x 6.9 16.1 cm (volume = 460 cm^3). Smaller hematoma slightly dorsal to the dominant hematoma within inter costal space (image 134/series 6). No associated rib fracture adjacent to the hematoma. No pelvic fracture identified. IMPRESSION: 1. Large (500 cc) intramuscular hematoma along the LEFT abdominal and chest wall. No associated rib fracture identified. 2. Intermediate density LEFT pleural  effusion. Cannot exclude hemothorax. Recommend correlation with history of trauma. No rib fracture identified. 3. No evidence of splenic injury. 4. Atrophic mal positioned LEFT kidney. Electronically Signed   By: Suzy Bouchard M.D.   On: 11/30/2021 16:02   DG Chest 2 View  Result Date: 11/30/2021 CLINICAL DATA:  Left pleural effusion EXAM: CHEST - 2 VIEW COMPARISON:  CT abdomen 11/30/2021 FINDINGS: Small left pleural effusion. Left basilar atelectasis. No right pleural effusion. No pneumothorax. Heart and mediastinal contours are unremarkable. No acute osseous abnormality. IMPRESSION: Small left pleural effusion with left basilar atelectasis. Electronically Signed   By: Kathreen Devoid M.D.   On: 11/30/2021 17:18   DG Chest 2 View  Result Date: 11/26/2021 CLINICAL DATA:  Worsening productive cough.  Shortness of breath. EXAM: CHEST - 2 VIEW COMPARISON:  None. FINDINGS: The heart size and mediastinal contours are within normal limits. Both lungs are clear. The visualized skeletal structures are unremarkable. IMPRESSION: No active cardiopulmonary disease. Electronically Signed   By: Dorise Bullion III M.D.   On: 11/26/2021 10:21   [place] CHEST (PLEURAL EFFUSION)  Result Date: 12/01/2021 CLINICAL DATA:  Possible thoracentesis EXAM: CHEST ULTRASOUND COMPARISON:  Chest x-ray 11/30/2021 FINDINGS: Only a small left pleural effusion is seen by ultrasound, not sufficient to tap at this time. IMPRESSION: Small left pleural effusion, not sufficient to tap. Electronically Signed   By: Rolm Baptise M.D.   On: 12/01/2021 09:50     The results of significant diagnostics from this hospitalization (including imaging, microbiology, ancillary and laboratory) are listed below for reference.     Microbiology: Recent Results (from the past 240 hour(s))  Resp Panel by RT-PCR (Flu A&B, Covid) Nasopharyngeal Swab     Status: None   Collection Time: 11/30/21  5:05 PM   Specimen: Nasopharyngeal Swab; Nasopharyngeal(NP)  swabs in vial transport medium  Result Value Ref Range Status   SARS Coronavirus 2 by RT PCR NEGATIVE NEGATIVE Final    Comment: (NOTE) SARS-CoV-2 target nucleic acids are NOT DETECTED.  The SARS-CoV-2 RNA is generally detectable in upper respiratory specimens during the acute phase of infection. The lowest concentration of SARS-CoV-2 viral copies this assay can detect is 138 copies/mL. A negative result does not preclude SARS-Cov-2 infection and should not be used as the sole basis for treatment or other patient management decisions. A negative result may occur with  improper specimen collection/handling, submission of specimen other than nasopharyngeal swab, presence of viral mutation(s) within the areas targeted by this assay, and inadequate number of viral copies(<138 copies/mL). A negative result must be combined with clinical observations, patient history, and epidemiological information. The expected result is Negative.  Fact Sheet for Patients:  EntrepreneurPulse.com.au  Fact  Sheet for Healthcare Providers:  IncredibleEmployment.be  This test is no t yet approved or cleared by the [place] FDA and  has been authorized for detection and/or diagnosis of SARS-CoV-2 by FDA under an Emergency Use Authorization (EUA). This EUA will remain  in effect (meaning this test can be used) for the duration of the COVID-19 declaration under Section 564(b)(1) of the Act, 21 U.S.C.section 360bbb-3(b)(1), unless the authorization is terminated  or revoked sooner.       Influenza A by PCR NEGATIVE NEGATIVE Final   Influenza B by PCR NEGATIVE NEGATIVE Final    Comment: (NOTE) The Xpert Xpress SARS-CoV-2/FLU/RSV plus assay is intended as an aid in the diagnosis of influenza from Nasopharyngeal swab specimens and should not be used as a sole basis for treatment. Nasal washings and aspirates are unacceptable for Xpert Xpress  SARS-CoV-2/FLU/RSV testing.  Fact Sheet for Patients: EntrepreneurPulse.com.au  Fact Sheet for Healthcare Providers: IncredibleEmployment.be  This test is not yet approved or cleared by the [place] FDA and has been authorized for detection and/or diagnosis of SARS-CoV-2 by FDA under an Emergency Use Authorization (EUA). This EUA will remain in effect (meaning this test can be used) for the duration of the COVID-19 declaration under Section 564(b)(1) of the Act, 21 U.S.C. section 360bbb-3(b)(1), unless the authorization is terminated or revoked.  Performed at [facility], [address], [place], [place] [address]      Labs: BNP (last 3 results) No results for input(s): BNP in the last 8760 hours. Basic Metabolic Panel: Recent Labs  Lab 11/30/21 1254 12/01/21 0351 12/02/21 0452  NA 134* 138 136  K 4.5 4.8 4.3  CL 105 105 105  CO2 22 25 25   GLUCOSE 139* 116* 111*  BUN 38* 38* 43*  CREATININE 1.80* 1.44* 1.71*  CALCIUM 7.9* 8.2* 8.2*  MG  --   --  2.1   Liver Function Tests: Recent Labs  Lab 12/01/21 0351  AST 22  ALT 20  ALKPHOS 58  BILITOT 1.4*  PROT 5.7*  ALBUMIN 3.1*   No results for input(s): LIPASE, AMYLASE in the last 168 hours. No results for input(s): AMMONIA in the last 168 hours. CBC: Recent Labs  Lab 11/30/21 1254 12/01/21 0351 12/01/21 1450 12/02/21 0452  WBC 11.7* 14.2*  --  13.2*  HGB 12.9* 11.2* 10.9* 9.8*  HCT 39.5 35.3* 33.5* 29.8*  MCV 95.4 94.9  --  93.4  PLT 219 210  --  198   Cardiac Enzymes: No results for input(s): CKTOTAL, CKMB, CKMBINDEX, TROPONINI in the last 168 hours. BNP: Invalid input(s): POCBNP CBG: Recent Labs  Lab 12/01/21 0050  GLUCAP 107*   D-Dimer No results for input(s): DDIMER in the last 72 hours. Hgb A1c No results for input(s): HGBA1C in the last 72 hours. Lipid Profile No results for input(s): CHOL, HDL, LDLCALC, TRIG, CHOLHDL, LDLDIRECT in the last 72  hours. Thyroid function studies No results for input(s): TSH, T4TOTAL, T3FREE, THYROIDAB in the last 72 hours.  Invalid input(s): FREET3 Anemia work up No results for input(s): VITAMINB12, FOLATE, FERRITIN, TIBC, IRON, RETICCTPCT in the last 72 hours. Urinalysis    Component Value Date/Time   COLORURINE YELLOW 01/29/2017 1600   APPEARANCEUR Clear 10/10/2021 1310   LABSPEC 1.012 01/29/2017 1600   PHURINE 5.0 01/29/2017 1600   GLUCOSEU Negative 10/10/2021 1310   HGBUR MODERATE (A) 01/29/2017 1600   BILIRUBINUR Negative 10/10/2021 1310   KETONESUR negative 05/04/2021 [place] 01/29/2017 1600  PROTEINUR Trace (A) 10/10/2021 1310   PROTEINUR NEGATIVE 01/29/2017 1600   UROBILINOGEN 0.2 05/04/2021 1012   UROBILINOGEN 0.2 11/29/2014 0037   NITRITE Negative 10/10/2021 1310   NITRITE NEGATIVE 01/29/2017 1600   LEUKOCYTESUR Negative 10/10/2021 1310   Sepsis Labs Invalid input(s): PROCALCITONIN,  WBC,  LACTICIDVEN Microbiology Recent Results (from the past 240 hour(s))  Resp Panel by RT-PCR (Flu A&B, Covid) Nasopharyngeal Swab     Status: None   Collection Time: 11/30/21  5:05 PM   Specimen: Nasopharyngeal Swab; Nasopharyngeal(NP) swabs in vial transport medium  Result Value Ref Range Status   SARS Coronavirus 2 by RT PCR NEGATIVE NEGATIVE Final    Comment: (NOTE) SARS-CoV-2 target nucleic acids are NOT DETECTED.  The SARS-CoV-2 RNA is generally detectable in upper respiratory specimens during the acute phase of infection. The lowest concentration of SARS-CoV-2 viral copies this assay can detect is 138 copies/mL. A negative result does not preclude SARS-Cov-2 infection and should not be used as the sole basis for treatment or other patient management decisions. A negative result may occur with  improper specimen collection/handling, submission of specimen other than nasopharyngeal swab, presence of viral mutation(s) within the areas targeted by this assay, and  inadequate number of viral copies(<138 copies/mL). A negative result must be combined with clinical observations, patient history, and epidemiological information. The expected result is Negative.  Fact Sheet for Patients:  EntrepreneurPulse.com.au  Fact Sheet for Healthcare Providers:  IncredibleEmployment.be  This test is no t yet approved or cleared by the [place] FDA and  has been authorized for detection and/or diagnosis of SARS-CoV-2 by FDA under an Emergency Use Authorization (EUA). This EUA will remain  in effect (meaning this test can be used) for the duration of the COVID-19 declaration under Section 564(b)(1) of the Act, 21 U.S.C.section 360bbb-3(b)(1), unless the authorization is terminated  or revoked sooner.       Influenza A by PCR NEGATIVE NEGATIVE Final   Influenza B by PCR NEGATIVE NEGATIVE Final    Comment: (NOTE) The Xpert Xpress SARS-CoV-2/FLU/RSV plus assay is intended as an aid in the diagnosis of influenza from Nasopharyngeal swab specimens and should not be used as a sole basis for treatment. Nasal washings and aspirates are unacceptable for Xpert Xpress SARS-CoV-2/FLU/RSV testing.  Fact Sheet for Patients: EntrepreneurPulse.com.au  Fact Sheet for Healthcare Providers: IncredibleEmployment.be  This test is not yet approved or cleared by the [place] FDA and has been authorized for detection and/or diagnosis of SARS-CoV-2 by FDA under an Emergency Use Authorization (EUA). This EUA will remain in effect (meaning this test can be used) for the duration of the COVID-19 declaration under Section 564(b)(1) of the Act, 21 U.S.C. section 360bbb-3(b)(1), unless the authorization is terminated or revoked.  Performed at [facility], [address] [place], [place] [address]      Time coordinating discharge:  I have spent 35 minutes face to face with the patient and on the  ward discussing the patients care, assessment, plan and disposition with other care givers. >50% of the time was devoted counseling the patient about the risks and benefits of treatment/Discharge disposition and coordinating care.   SIGNED:   Damita Lack, MD  [facility] 12/02/2021, 11:35 AM   If 7PM-7AM, please contact night-coverage

## 2021-12-12 ENCOUNTER — Telehealth: Payer: Self-pay | Admitting: Cardiology

## 2021-12-12 NOTE — Telephone Encounter (Signed)
Patient called to talk with Edrick Oh in regards to hospital visit. Patient was in hospital on 12/02/21. Patient has a massive hematomia and was told to stop taking his comadin for 2 weeks....please call back asap

## 2021-12-12 NOTE — Telephone Encounter (Signed)
Patient called again in regards to his Coumadin. He wanted to let Lattie Haw know that he will be home all day today. He is going to see his PCP tomorrow at 9:00 but will be home by 10:30

## 2021-12-12 NOTE — Telephone Encounter (Signed)
Pt called to give me information about being in hospital.  Had massive left abd hematoma after a sever coughing attack.  Felt to be a spontaneous tear.  Was admitted and given Vit K.  INR was 3.1 on admission.  Pt was told at discharge to stay off warfarin a minimum of 2 wks.  He has appt with Dr Willey Blade tomorrow for f/u.  He will let me know what Dr Willey Blade says about resuming warfarin on Saturday.  Hgb down to 9.8.

## 2021-12-13 DIAGNOSIS — D550 Anemia due to glucose-6-phosphate dehydrogenase [G6PD] deficiency: Secondary | ICD-10-CM | POA: Diagnosis not present

## 2021-12-13 DIAGNOSIS — S301XXA Contusion of abdominal wall, initial encounter: Secondary | ICD-10-CM | POA: Diagnosis not present

## 2021-12-13 DIAGNOSIS — D6869 Other thrombophilia: Secondary | ICD-10-CM | POA: Diagnosis not present

## 2021-12-27 ENCOUNTER — Ambulatory Visit (INDEPENDENT_AMBULATORY_CARE_PROVIDER_SITE_OTHER): Payer: PPO | Admitting: *Deleted

## 2021-12-27 DIAGNOSIS — Z5181 Encounter for therapeutic drug level monitoring: Secondary | ICD-10-CM

## 2021-12-27 DIAGNOSIS — I482 Chronic atrial fibrillation, unspecified: Secondary | ICD-10-CM | POA: Diagnosis not present

## 2021-12-27 LAB — POCT INR: INR: 2.1 (ref 2.0–3.0)

## 2021-12-27 NOTE — Patient Instructions (Signed)
Continue warfarin 1/2 tablet daily except 1 tablet on Tuesdays. Eat extra greens/salad today Recheck in 4 weeks.

## 2022-01-01 DIAGNOSIS — D6869 Other thrombophilia: Secondary | ICD-10-CM | POA: Diagnosis not present

## 2022-01-01 DIAGNOSIS — S301XXA Contusion of abdominal wall, initial encounter: Secondary | ICD-10-CM | POA: Diagnosis not present

## 2022-01-08 ENCOUNTER — Other Ambulatory Visit: Payer: Self-pay | Admitting: Cardiology

## 2022-01-08 DIAGNOSIS — Z5181 Encounter for therapeutic drug level monitoring: Secondary | ICD-10-CM

## 2022-01-08 DIAGNOSIS — I482 Chronic atrial fibrillation, unspecified: Secondary | ICD-10-CM

## 2022-01-24 ENCOUNTER — Ambulatory Visit (INDEPENDENT_AMBULATORY_CARE_PROVIDER_SITE_OTHER): Payer: PPO | Admitting: *Deleted

## 2022-01-24 DIAGNOSIS — I482 Chronic atrial fibrillation, unspecified: Secondary | ICD-10-CM | POA: Diagnosis not present

## 2022-01-24 DIAGNOSIS — Z5181 Encounter for therapeutic drug level monitoring: Secondary | ICD-10-CM

## 2022-01-24 LAB — POCT INR: INR: 4.2 — AB (ref 2.0–3.0)

## 2022-01-24 NOTE — Patient Instructions (Signed)
Hold warfarin Thursday and Friday then resume 1/2 tablet daily except 1 tablet on Tuesdays. Eat extra greens/salad today Recheck in 3 weeks.

## 2022-02-07 DIAGNOSIS — H259 Unspecified age-related cataract: Secondary | ICD-10-CM | POA: Diagnosis not present

## 2022-02-07 DIAGNOSIS — E039 Hypothyroidism, unspecified: Secondary | ICD-10-CM | POA: Diagnosis not present

## 2022-02-07 DIAGNOSIS — N1831 Chronic kidney disease, stage 3a: Secondary | ICD-10-CM | POA: Diagnosis not present

## 2022-02-07 DIAGNOSIS — E261 Secondary hyperaldosteronism: Secondary | ICD-10-CM | POA: Diagnosis not present

## 2022-02-07 DIAGNOSIS — I509 Heart failure, unspecified: Secondary | ICD-10-CM | POA: Diagnosis not present

## 2022-02-07 DIAGNOSIS — D6869 Other thrombophilia: Secondary | ICD-10-CM | POA: Diagnosis not present

## 2022-02-07 DIAGNOSIS — D649 Anemia, unspecified: Secondary | ICD-10-CM | POA: Diagnosis not present

## 2022-02-07 DIAGNOSIS — E785 Hyperlipidemia, unspecified: Secondary | ICD-10-CM | POA: Diagnosis not present

## 2022-02-07 DIAGNOSIS — I482 Chronic atrial fibrillation, unspecified: Secondary | ICD-10-CM | POA: Diagnosis not present

## 2022-02-07 DIAGNOSIS — I130 Hypertensive heart and chronic kidney disease with heart failure and stage 1 through stage 4 chronic kidney disease, or unspecified chronic kidney disease: Secondary | ICD-10-CM | POA: Diagnosis not present

## 2022-02-07 DIAGNOSIS — F3342 Major depressive disorder, recurrent, in full remission: Secondary | ICD-10-CM | POA: Diagnosis not present

## 2022-02-14 ENCOUNTER — Ambulatory Visit (INDEPENDENT_AMBULATORY_CARE_PROVIDER_SITE_OTHER): Payer: PPO | Admitting: *Deleted

## 2022-02-14 DIAGNOSIS — I482 Chronic atrial fibrillation, unspecified: Secondary | ICD-10-CM | POA: Diagnosis not present

## 2022-02-14 DIAGNOSIS — Z5181 Encounter for therapeutic drug level monitoring: Secondary | ICD-10-CM | POA: Diagnosis not present

## 2022-02-14 LAB — POCT INR: INR: 3.3 — AB (ref 2.0–3.0)

## 2022-02-14 NOTE — Patient Instructions (Signed)
Hold warfarin tonight then decrease dose to 1/2 tablet daily  ?Continue greens/salad today ?Recheck in 3 weeks. ?

## 2022-02-22 DIAGNOSIS — N038 Chronic nephritic syndrome with other morphologic changes: Secondary | ICD-10-CM | POA: Diagnosis not present

## 2022-02-22 DIAGNOSIS — I129 Hypertensive chronic kidney disease with stage 1 through stage 4 chronic kidney disease, or unspecified chronic kidney disease: Secondary | ICD-10-CM | POA: Diagnosis not present

## 2022-02-22 DIAGNOSIS — E875 Hyperkalemia: Secondary | ICD-10-CM | POA: Diagnosis not present

## 2022-02-22 DIAGNOSIS — N1831 Chronic kidney disease, stage 3a: Secondary | ICD-10-CM | POA: Diagnosis not present

## 2022-02-22 DIAGNOSIS — R809 Proteinuria, unspecified: Secondary | ICD-10-CM | POA: Diagnosis not present

## 2022-02-28 DIAGNOSIS — N1831 Chronic kidney disease, stage 3a: Secondary | ICD-10-CM | POA: Diagnosis not present

## 2022-02-28 DIAGNOSIS — N038 Chronic nephritic syndrome with other morphologic changes: Secondary | ICD-10-CM | POA: Diagnosis not present

## 2022-02-28 DIAGNOSIS — I129 Hypertensive chronic kidney disease with stage 1 through stage 4 chronic kidney disease, or unspecified chronic kidney disease: Secondary | ICD-10-CM | POA: Diagnosis not present

## 2022-02-28 DIAGNOSIS — R809 Proteinuria, unspecified: Secondary | ICD-10-CM | POA: Diagnosis not present

## 2022-02-28 DIAGNOSIS — E559 Vitamin D deficiency, unspecified: Secondary | ICD-10-CM | POA: Diagnosis not present

## 2022-04-02 DIAGNOSIS — D649 Anemia, unspecified: Secondary | ICD-10-CM | POA: Diagnosis not present

## 2022-04-02 DIAGNOSIS — N1831 Chronic kidney disease, stage 3a: Secondary | ICD-10-CM | POA: Diagnosis not present

## 2022-04-02 DIAGNOSIS — I480 Paroxysmal atrial fibrillation: Secondary | ICD-10-CM | POA: Diagnosis not present

## 2022-04-09 NOTE — Progress Notes (Signed)
History of Present Illness:  ? ?Patient returns for follow-up of lower urinary tract symptoms and history of microscopic hematuria.   ? ?He has a prostatic volume of approximately 50 mL.   ? ?Had a negative evaluation for microscopic hematuria in 2018.  That included negative cytology as he did have a history of Cytoxan administration for vasculitis. ? ?11.1.2022: Cytology revealed atypical cells. ? ?5.2.2023: He denies gross hematuria.  He has had some dysuria.  No change in lower urinary tract symptoms.  He is on tamsulosin.  He does complain about nocturia x3-4.  He does have peripheral edema. ? ? ?Past Medical History:  ?Diagnosis Date  ? Chronic anticoagulation   ? Colonoscopy with polypectomy in 2006; due for a repeat procedure as of 2012  ? Chronic atrial fibrillation ([place]) 2001  ? Spontaneous contrast on TEE  ? Degenerative joint disease of spine   ? Mild; cervical and lumbosacral  ? Gout   ? Hyperlipidemia   ? Overweight(278.02)   ? Pseudogout   ? ? ?Past Surgical History:  ?Procedure Laterality Date  ? COLONOSCOPY N/A 04/23/2013  ? Procedure: COLONOSCOPY;  Surgeon: Rogene Houston, MD;  Location: [facility];  Service: Endoscopy;  Laterality: N/A;  1030-rescheduled to [place] notified pt  ? COLONOSCOPY W/ POLYPECTOMY  2006  ? ? ?Home Medications:  ?Allergies as of 04/10/2022   ? ?   Reactions  ? Uloric [febuxostat] Rash  ? ?  ? ?  ?Medication List  ?  ? ?  ? Accurate as of Apr 09, 2022  7:46 PM. If you have any questions, ask your nurse or doctor.  ?  ?  ? ?  ? ?allopurinol 100 MG tablet ?Commonly known as: ZYLOPRIM ?Take 300 mg by mouth. ?  ?allopurinol 300 MG tablet ?Commonly known as: ZYLOPRIM ?Take 300 mg by mouth daily. ?  ?aspirin EC 81 MG tablet ?Take 162 mg by mouth daily. Swallow whole. ?  ?atenolol 50 MG tablet ?Commonly known as: TENORMIN ?TAKE ONE TABLET BY MOUTH DAILY. ?  ?atorvastatin 20 MG tablet ?Commonly known as: LIPITOR ?Take 20 mg by mouth daily. ?  ?benzonatate 200 MG capsule ?Commonly  known as: TESSALON ?Take 200 mg by mouth 3 (three) times daily. ?  ?diltiazem 240 MG 24 hr capsule ?Commonly known as: Cartia XT ?Take 1 capsule (240 mg total) by mouth daily. ?  ?escitalopram 10 MG tablet ?Commonly known as: LEXAPRO ?Take 10 mg by mouth daily. ?  ?escitalopram 20 MG tablet ?Commonly known as: LEXAPRO ?Take 20 mg by mouth daily. ?  ?lisinopril 2.5 MG tablet ?Commonly known as: ZESTRIL ?Take 2.5 mg by mouth daily. ?  ?oxyCODONE 5 MG immediate release tablet ?Commonly known as: Oxy IR/ROXICODONE ?Take 1 tablet (5 mg total) by mouth every 4 (four) hours as needed for moderate pain or severe pain. ?  ?predniSONE 20 MG tablet ?Commonly known as: DELTASONE ?Take 2 tablets (40 mg total) by mouth daily with breakfast. ?  ?Promethazine HCl 6.25 MG/5ML Soln ?Take 5 mLs by mouth every 4 (four) hours as needed. ?  ?promethazine-dextromethorphan 6.25-15 MG/5ML syrup ?Commonly known as: PROMETHAZINE-DM ?Take 5 mLs by mouth 4 (four) times daily as needed. ?  ?senna-docusate 8.6-50 MG tablet ?Commonly known as: Senokot-S ?Take 1 tablet by mouth 2 (two) times daily as needed for moderate constipation. ?  ?sildenafil 100 MG tablet ?Commonly known as: VIAGRA ?1/2 to 1 tablet p.o. as needed ?  ?sildenafil 20 MG tablet ?Commonly known as:  REVATIO ?TAKE 3-5 TABLETS 30 MINS PRIOR TO INTERCOURSE ?  ?tamsulosin 0.4 MG Caps capsule ?Commonly known as: FLOMAX ?Take 1 capsule (0.4 mg total) by mouth daily. ?  ?warfarin 5 MG tablet ?Commonly known as: COUMADIN ?Take as directed by the anticoagulation clinic. If you are unsure how to take this medication, talk to your nurse or doctor. ?Original instructions: TAKE 1/2 TABLET BY MOUTH DAILY EXCEPT 1 TAB ON TUESDAYS AS DIRECTED BY COUMADIN CLINIC. ?  ? ?  ? ? ?Allergies:  ?Allergies  ?Allergen Reactions  ? Uloric [Febuxostat] Rash  ? ? ?Family History  ?Problem Relation Age of Onset  ? Stroke Mother   ? Cancer Mother   ? Heart attack Father   ? Arrhythmia Sister   ?     Atrial  fibrillation  ? Arrhythmia Brother   ? Arrhythmia Brother   ? Diabetes Other   ? Heart attack Other   ? Colon cancer Neg Hx   ? ? ?Social History:  reports that he has never smoked. He has never used smokeless tobacco. He reports that he does not drink alcohol and does not use drugs. ? ?ROS: ?A complete review of systems was performed.  All systems are negative except for pertinent findings as noted. ? ?Physical Exam:  ?Vital signs in last 24 hours: ?There were no vitals taken for this visit. ?Constitutional:  Alert and oriented, No acute distress ?Cardiovascular: Regular rate  ?Respiratory: Normal respiratory effort ?GI: Abdomen is soft, nontender, nondistended, no abdominal masses. No CVAT.  ?Genitourinary: Normal male phallus, testes are descended bilaterally and non-tender and without masses, scrotum is normal in appearance without lesions or masses, perineum is normal on inspection.  Normal anal sphincter tone.  Prostate 30 g, symmetric, nonnodular, nontender. ?Lymphatic: No lymphadenopathy ?Neurologic: Grossly intact, no focal deficits ?Psychiatric: Normal mood and affect ? ?I have reviewed prior pt notes ? ?I have reviewed urinalysis results ? ?I have independently reviewed prior cytology results ? ?I have reviewed prior PSA results ? ?Prior physician notes reviewed ? ? ? ?Impression/Assessment:  ?1.  Pyuria-may well be UTI ? ?2.  Microscopic hematuria with Cytoxan exposure.  Atypical cytology but prior negative hematuria evaluation ? ?3.  BPH, symptoms stable on medical therapy ? ?Plan:  ?1.  Urine was sent for cytology and culture.  No antibiotics administered yet ? ?2.  I will keep a check on him every 6 months ? ?

## 2022-04-10 ENCOUNTER — Ambulatory Visit (INDEPENDENT_AMBULATORY_CARE_PROVIDER_SITE_OTHER): Payer: PPO | Admitting: Urology

## 2022-04-10 ENCOUNTER — Encounter: Payer: Self-pay | Admitting: Urology

## 2022-04-10 VITALS — BP 103/69 | HR 75

## 2022-04-10 DIAGNOSIS — R39198 Other difficulties with micturition: Secondary | ICD-10-CM

## 2022-04-10 DIAGNOSIS — N401 Enlarged prostate with lower urinary tract symptoms: Secondary | ICD-10-CM

## 2022-04-10 DIAGNOSIS — R3129 Other microscopic hematuria: Secondary | ICD-10-CM

## 2022-04-10 DIAGNOSIS — R351 Nocturia: Secondary | ICD-10-CM | POA: Diagnosis not present

## 2022-04-10 DIAGNOSIS — R350 Frequency of micturition: Secondary | ICD-10-CM | POA: Diagnosis not present

## 2022-04-10 DIAGNOSIS — R8281 Pyuria: Secondary | ICD-10-CM

## 2022-04-10 LAB — MICROSCOPIC EXAMINATION
Epithelial Cells (non renal): >10 /hpf — AB (ref 0–10)
Renal Epithel, UA: NONE SEEN /hpf

## 2022-04-10 LAB — URINALYSIS, ROUTINE W REFLEX MICROSCOPIC
Bilirubin, UA: NEGATIVE
Glucose, UA: NEGATIVE
Ketones, UA: NEGATIVE
Nitrite, UA: NEGATIVE
Protein,UA: NEGATIVE
Specific Gravity, UA: 1.025 (ref 1.005–1.030)
Urobilinogen, Ur: 0.2 mg/dL (ref 0.2–1.0)
pH, UA: 5.5 (ref 5.0–7.5)

## 2022-04-11 LAB — CYTOLOGY, URINE

## 2022-04-13 LAB — URINE CULTURE

## 2022-04-16 NOTE — Progress Notes (Signed)
Patient notified via letter

## 2022-04-17 ENCOUNTER — Telehealth: Payer: Self-pay

## 2022-04-17 NOTE — Telephone Encounter (Signed)
Patient left a voice message on 04-16-2022. ? ?Checking if an Rx was called into his pharmacy. ? ?Please advise. ? ?Call back:  [phone] ? ?Thanks, ?Helene Kelp ? ?

## 2022-04-18 NOTE — Telephone Encounter (Signed)
Patient called and made aware no rx has been sent in at this time. ?

## 2022-06-15 ENCOUNTER — Encounter: Payer: Self-pay | Admitting: *Deleted

## 2022-06-15 DIAGNOSIS — N038 Chronic nephritic syndrome with other morphologic changes: Secondary | ICD-10-CM | POA: Diagnosis not present

## 2022-06-15 DIAGNOSIS — R809 Proteinuria, unspecified: Secondary | ICD-10-CM | POA: Diagnosis not present

## 2022-06-15 DIAGNOSIS — E559 Vitamin D deficiency, unspecified: Secondary | ICD-10-CM | POA: Diagnosis not present

## 2022-06-15 DIAGNOSIS — I129 Hypertensive chronic kidney disease with stage 1 through stage 4 chronic kidney disease, or unspecified chronic kidney disease: Secondary | ICD-10-CM | POA: Diagnosis not present

## 2022-06-15 DIAGNOSIS — N1831 Chronic kidney disease, stage 3a: Secondary | ICD-10-CM | POA: Diagnosis not present

## 2022-06-23 DIAGNOSIS — E8721 Acute metabolic acidosis: Secondary | ICD-10-CM | POA: Diagnosis not present

## 2022-06-23 DIAGNOSIS — N1831 Chronic kidney disease, stage 3a: Secondary | ICD-10-CM | POA: Diagnosis not present

## 2022-06-23 DIAGNOSIS — R0683 Snoring: Secondary | ICD-10-CM | POA: Diagnosis not present

## 2022-06-23 DIAGNOSIS — E559 Vitamin D deficiency, unspecified: Secondary | ICD-10-CM | POA: Diagnosis not present

## 2022-06-23 DIAGNOSIS — I129 Hypertensive chronic kidney disease with stage 1 through stage 4 chronic kidney disease, or unspecified chronic kidney disease: Secondary | ICD-10-CM | POA: Diagnosis not present

## 2022-06-23 DIAGNOSIS — N038 Chronic nephritic syndrome with other morphologic changes: Secondary | ICD-10-CM | POA: Diagnosis not present

## 2022-07-31 DIAGNOSIS — Z79899 Other long term (current) drug therapy: Secondary | ICD-10-CM | POA: Diagnosis not present

## 2022-07-31 DIAGNOSIS — N1831 Chronic kidney disease, stage 3a: Secondary | ICD-10-CM | POA: Diagnosis not present

## 2022-07-31 DIAGNOSIS — E785 Hyperlipidemia, unspecified: Secondary | ICD-10-CM | POA: Diagnosis not present

## 2022-08-01 DIAGNOSIS — G4733 Obstructive sleep apnea (adult) (pediatric): Secondary | ICD-10-CM | POA: Diagnosis not present

## 2022-08-01 DIAGNOSIS — E785 Hyperlipidemia, unspecified: Secondary | ICD-10-CM | POA: Diagnosis not present

## 2022-08-01 DIAGNOSIS — I10 Essential (primary) hypertension: Secondary | ICD-10-CM | POA: Diagnosis not present

## 2022-08-01 DIAGNOSIS — N1831 Chronic kidney disease, stage 3a: Secondary | ICD-10-CM | POA: Diagnosis not present

## 2022-08-06 ENCOUNTER — Ambulatory Visit: Payer: PPO | Attending: Cardiology | Admitting: *Deleted

## 2022-08-06 DIAGNOSIS — Z5181 Encounter for therapeutic drug level monitoring: Secondary | ICD-10-CM

## 2022-08-06 DIAGNOSIS — I482 Chronic atrial fibrillation, unspecified: Secondary | ICD-10-CM | POA: Diagnosis not present

## 2022-08-06 LAB — POCT INR: INR: 3.4 — AB (ref 2.0–3.0)

## 2022-08-06 NOTE — Patient Instructions (Signed)
Hold warfarin tomorrow then resume 1/2 tablet daily  Increase greens/salads Recheck in 4 weeks.

## 2022-08-21 DIAGNOSIS — G4733 Obstructive sleep apnea (adult) (pediatric): Secondary | ICD-10-CM | POA: Diagnosis not present

## 2022-09-05 ENCOUNTER — Ambulatory Visit: Payer: PPO | Attending: Cardiology

## 2022-09-11 ENCOUNTER — Encounter: Payer: Self-pay | Admitting: Internal Medicine

## 2022-09-11 ENCOUNTER — Encounter: Payer: Self-pay | Admitting: Primary Care

## 2022-09-11 ENCOUNTER — Ambulatory Visit: Payer: PPO | Admitting: Primary Care

## 2022-09-11 DIAGNOSIS — G4733 Obstructive sleep apnea (adult) (pediatric): Secondary | ICD-10-CM | POA: Diagnosis not present

## 2022-09-11 NOTE — Addendum Note (Signed)
Addended by: Irine Seal B on: 09/11/2022 10:49 AM   Modules accepted: Orders

## 2022-09-11 NOTE — Progress Notes (Signed)
$'@Patient'T$  ID: Jackson Latino, male    DOB: 1951-04-16, 71 y.o.   MRN: [id number]  Chief Complaint  Patient presents with   Consult    Referring provider: Asencion Noble, MD  HPI: 71 year old male, never smoked.  Past medical history significant for chronic A-fib on anticoagulation, hyperlipidemia, gout.  09/11/2022 Patient presents today for sleep consult. He has symptoms of snoring and significant daytime sleepiness. He reports being extremley tired during the day. He has not felt like doing anything the last year. His mood is not depressed. He had sleep study on 08/21/22 through [facility] that showed he had severe sleep apnea, AHI 65/hour with Spo2 low 72% (99%). Weight 240lbs. We reviewed treatment options. He is interested in starting CPAP. He plans on working on weight loss. He would like to use [employer] His wife has parkinson's psychosis, she is a Building services engineer for [employer]. Sleep apnea runs in his family. Denies symptoms of narcolepsy, cataplexy or sleepwalking.  Sleep questionnaire Symptoms-   Snoring  Prior sleep study- 08/21/22 >> severe OSA, AHI 65/hr  Bedtime- 10pm Time to fall asleep- 15 mins Nocturnal awakenings- 3-4 times Out of bed/start of day- 7am Weight changes- No Do you operate heavy machinery- No Do you currently wear CPAP- No Do you current wear oxygen- No Epworth- 7   Allergies  Allergen Reactions   Uloric [Febuxostat] Rash    Immunization History  Administered Date(s) Administered   Influenza Split 09/25/2016    Past Medical History:  Diagnosis Date   Chronic anticoagulation    Colonoscopy with polypectomy in 2006; due for a repeat procedure as of 2012   Chronic atrial fibrillation ([place]) 2001   Spontaneous contrast on TEE   Degenerative joint disease of spine    Mild; cervical and lumbosacral   Gout    Hyperlipidemia    Overweight(278.02)    Pseudogout     Tobacco History: Social History   Tobacco Use   Smoking Status Never   Passive exposure: Past  Smokeless Tobacco Never   Counseling given: Not Answered   Outpatient Medications Prior to Visit  Medication Sig Dispense Refill   allopurinol (ZYLOPRIM) 300 MG tablet Take 300 mg by mouth daily.     atenolol (TENORMIN) 50 MG tablet TAKE ONE TABLET BY MOUTH DAILY. 90 tablet 2   atorvastatin (LIPITOR) 20 MG tablet Take 20 mg by mouth daily.     benzonatate (TESSALON) 200 MG capsule Take 200 mg by mouth 3 (three) times daily.     Cholecalciferol 25 MCG (1000 UT) capsule Take by mouth.     diltiazem (CARTIA XT) 240 MG 24 hr capsule Take 1 capsule (240 mg total) by mouth daily. 30 capsule 1   lisinopril (ZESTRIL) 2.5 MG tablet Take 2.5 mg by mouth daily.     predniSONE (DELTASONE) 20 MG tablet Take 2 tablets (40 mg total) by mouth daily with breakfast. 10 tablet 0   Promethazine HCl 6.25 MG/5ML SOLN Take 5 mLs by mouth every 4 (four) hours as needed.     senna-docusate (SENOKOT-S) 8.6-50 MG tablet Take 1 tablet by mouth 2 (two) times daily as needed for moderate constipation. 30 tablet 0   tamsulosin (FLOMAX) 0.4 MG CAPS capsule Take 1 capsule (0.4 mg total) by mouth daily. 90 capsule 3   warfarin (COUMADIN) 5 MG tablet TAKE 1/2 TABLET BY MOUTH DAILY EXCEPT 1 TAB ON TUESDAYS AS DIRECTED BY COUMADIN CLINIC. (Patient taking differently: Pt. Takes daily) 30 tablet 5  allopurinol (ZYLOPRIM) 100 MG tablet Take 300 mg by mouth.     aspirin EC 81 MG tablet Take 162 mg by mouth daily. Swallow whole.     escitalopram (LEXAPRO) 10 MG tablet Take 10 mg by mouth daily. (Patient not taking: Reported on 11/30/2021)     escitalopram (LEXAPRO) 20 MG tablet Take 20 mg by mouth daily. (Patient not taking: Reported on 11/30/2021)     oxyCODONE (OXY IR/ROXICODONE) 5 MG immediate release tablet Take 1 tablet (5 mg total) by mouth every 4 (four) hours as needed for moderate pain or severe pain. (Patient not taking: Reported on 04/10/2022) 15 tablet 0    promethazine-dextromethorphan (PROMETHAZINE-DM) 6.25-15 MG/5ML syrup Take 5 mLs by mouth 4 (four) times daily as needed. 100 mL 0   sildenafil (REVATIO) 20 MG tablet TAKE 3-5 TABLETS 30 MINS PRIOR TO INTERCOURSE (Patient not taking: Reported on 11/30/2021) 30 tablet 1   sildenafil (VIAGRA) 100 MG tablet 1/2 to 1 tablet p.o. as needed (Patient not taking: Reported on 11/30/2021) 20 tablet prn   No facility-administered medications prior to visit.   Review of Systems  Review of Systems  Constitutional: Negative.   HENT: Negative.    Respiratory: Negative.     Physical Exam  BP 124/70 (BP Location: Left Arm, Patient Position: Sitting, Cuff Size: Large)   Pulse 78   Temp (!) 97.5 F (36.4 C) (Oral)   Ht '5\' 7"'$  (1.702 m)   Wt 240 lb 3.2 oz (109 kg)   SpO2 96%   BMI 37.62 kg/m  Physical Exam Constitutional:      General: He is not in acute distress.    Appearance: Normal appearance. He is obese. He is not ill-appearing.  HENT:     Head: Normocephalic and atraumatic.     Mouth/Throat:     Mouth: Mucous membranes are moist.     Pharynx: Oropharynx is clear.  Cardiovascular:     Rate and Rhythm: Normal rate and regular rhythm.  Pulmonary:     Effort: Pulmonary effort is normal.     Breath sounds: Normal breath sounds.     Comments: CTA Musculoskeletal:        General: Normal range of motion.  Skin:    General: Skin is warm and dry.  Neurological:     General: No focal deficit present.     Mental Status: He is alert and oriented to person, place, and time. Mental status is at baseline.  Psychiatric:        Mood and Affect: Mood normal.        Behavior: Behavior normal.        Thought Content: Thought content normal.        Judgment: Judgment normal.      Lab Results:  CBC    Component Value Date/Time   WBC 13.2 (H) 12/02/2021 0452   RBC 3.19 (L) 12/02/2021 0452   HGB 9.8 (L) 12/02/2021 0452   HCT 29.8 (L) 12/02/2021 0452   PLT 198 12/02/2021 0452   MCV 93.4  12/02/2021 0452   MCH 30.7 12/02/2021 0452   MCHC 32.9 12/02/2021 0452   RDW 15.0 12/02/2021 0452   LYMPHSABS 0.3 (L) 11/28/2014 2314   MONOABS 0.3 11/28/2014 2314   EOSABS 0.0 11/28/2014 2314   BASOSABS 0.0 11/28/2014 2314    BMET    Component Value Date/Time   NA 136 12/02/2021 0452   K 4.3 12/02/2021 0452   CL 105 12/02/2021 0452   CO2 25 12/02/2021  0452   GLUCOSE 111 (H) 12/02/2021 0452   BUN 43 (H) 12/02/2021 0452   CREATININE 1.71 (H) 12/02/2021 0452   CREATININE 0.92 02/05/2012 1419   CALCIUM 8.2 (L) 12/02/2021 0452   GFRNONAA 43 (L) 12/02/2021 0452   GFRAA 38 (L) 11/28/2014 2314    BNP No results found for: "BNP"  ProBNP No results found for: "PROBNP"  Imaging: No results found.   Assessment & Plan:   OSA (obstructive sleep apnea) - Patient has symptoms of loud snoring and excessive daytime sleepiness. Epworth 7. He had a sleep study with [facility] on 08/21/2022 that showed severe obstructive sleep apnea, AHI 65 an hour with SPO2 low 72% (average 99%).  Weight 240 pounds.  We reviewed of untreated sleep apnea and treatment options.  He is interested in being started on CPAP.  We will place an order for patient be started on auto CPAP 5 to 15 cm H2O with [employer].  Advise he aim to wear CPAP every night for minimum 4 to 6 hours.  Encourage weight loss efforts.  Advised against driving experiencing excessive daytime sleepiness fatigue.  Follow-up 31 to 90 days after CPAP start for compliance check.  Martyn Ehrich, NP 09/11/2022

## 2022-09-11 NOTE — Assessment & Plan Note (Signed)
-   Patient has symptoms of loud snoring and excessive daytime sleepiness. Epworth 7. He had a sleep study with [facility] on 08/21/2022 that showed severe obstructive sleep apnea, AHI 65 an hour with SPO2 low 72% (average 99%).  Weight 240 pounds.  We reviewed of untreated sleep apnea and treatment options.  He is interested in being started on CPAP.  We will place an order for patient be started on auto CPAP 5 to 15 cm H2O with [employer].  Advise he aim to wear CPAP every night for minimum 4 to 6 hours.  Encourage weight loss efforts.  Advised against driving experiencing excessive daytime sleepiness fatigue.  Follow-up 31 to 90 days after CPAP start for compliance check.

## 2022-09-11 NOTE — Patient Instructions (Addendum)
Sleep apnea is defined as period of 10 seconds or longer when you stop breathing at night. This can happen multiple times a night. Dx sleep apnea is when this occurs more than 5 times an hour.    Mild OSA 5-15 apneic events an hour Moderate OSA 15-30 apneic events an hour Severe OSA > 30 apneic events an hour  Your sleep study showed that you have severe obstructive sleep apnea, you had on average 65 events an hours    Untreated sleep apnea puts you at higher risk for cardiac arrhythmias, pulmonary HTN, stroke and diabetes   Treatment options include weight loss, side sleeping position, oral appliance, CPAP therapy or referral to ENT for possible surgical options   Due to the severity of your sleep apnea recommending you be started on CPAP   Recommendations: Aim to wear CPAP every night for min 4-6 hours. Change supplies every 3 months.  Focus on side sleeping position or elevate head with wedge pillow 30 degrees Work on weight loss efforts if able  Do not drive if experiencing excessive daytime sleepiness of fatigue    Orders: New CPAP AUTO 5-20cm h20 (sleep study 08/21/22; snoring symptoms)     Follow-up: Please call to schedule follow-up 31-90 days after you start wearing CPAP for compliance check    CPAP and BIPAP Information CPAP and BIPAP are methods that use air pressure to keep your airways open and to help you breathe well. CPAP and BIPAP use different amounts of pressure. Your health care provider will tell you whether CPAP or BIPAP would be more helpful for you. CPAP stands for "continuous positive airway pressure." With CPAP, the amount of pressure stays the same while you breathe in (inhale) and out (exhale). BIPAP stands for "bi-level positive airway pressure." With BIPAP, the amount of pressure will be higher when you inhale and lower when you exhale. This allows you to take larger breaths. CPAP or BIPAP may be used in the hospital, or your health care provider may want  you to use it at home. You may need to have a sleep study before your health care provider can order a machine for you to use at home. What are the advantages? CPAP or BIPAP can be helpful if you have: Sleep apnea. Chronic obstructive pulmonary disease (COPD). Heart failure. Medical conditions that cause muscle weakness, including muscular dystrophy or amyotrophic lateral sclerosis (ALS). Other problems that cause breathing to be shallow, weak, abnormal, or difficult. CPAP and BIPAP are most commonly used for obstructive sleep apnea (OSA) to keep the airways from collapsing when the muscles relax during sleep. What are the risks? Generally, this is a safe treatment. However, problems may occur, including: Irritated skin or skin sores if the mask does not fit properly. Dry or stuffy nose or nosebleeds. Dry mouth. Feeling gassy or bloated. Sinus or lung infection if the equipment is not cleaned properly. When should CPAP or BIPAP be used? In most cases, the mask only needs to be worn during sleep. Generally, the mask needs to be worn throughout the night and during any daytime naps. People with certain medical conditions may also need to wear the mask at other times, such as when they are awake. Follow instructions from your health care provider about when to use the machine. What happens during CPAP or BIPAP?  Both CPAP and BIPAP are provided by a small machine with a flexible plastic tube that attaches to a plastic mask that you wear. Air is blown  through the mask into your nose or mouth. The amount of pressure that is used to blow the air can be adjusted on the machine. Your health care provider will set the pressure setting and help you find the best mask for you. Tips for using the mask Because the mask needs to be snug, some people feel trapped or closed-in (claustrophobic) when first using the mask. If you feel this way, you may need to get used to the mask. One way to do this is to hold  the mask loosely over your nose or mouth and then gradually apply the mask more snugly. You can also gradually increase the amount of time that you use the mask. Masks are available in various types and sizes. If your mask does not fit well, talk with your health care provider about getting a different one. Some common types of masks include: Full face masks, which fit over the mouth and nose. Nasal masks, which fit over the nose. Nasal pillow or prong masks, which fit into the nostrils. If you are using a mask that fits over your nose and you tend to breathe through your mouth, a chin strap may be applied to help keep your mouth closed. Use a skin barrier to protect your skin as told by your health care provider. Some CPAP and BIPAP machines have alarms that may sound if the mask comes off or develops a leak. If you have trouble with the mask, it is very important that you talk with your health care provider about finding a way to make the mask easier to tolerate. Do not stop using the mask. There could be a negative impact on your health if you stop using the mask. Tips for using the machine Place your CPAP or BIPAP machine on a secure table or stand near an electrical outlet. Know where the on/off switch is on the machine. Follow instructions from your health care provider about how to set the pressure on your machine and when you should use it. Do not eat or drink while the CPAP or BIPAP machine is on. Food or fluids could get pushed into your lungs by the pressure of the CPAP or BIPAP. For home use, CPAP and BIPAP machines can be rented or purchased through home health care companies. Many different brands of machines are available. Renting a machine before purchasing may help you find out which particular machine works well for you. Your health insurance company may also decide which machine you may get. Keep the CPAP or BIPAP machine and attachments clean. Ask your health care provider for  specific instructions. Check the humidifier if you have a dry stuffy nose or nosebleeds. Make sure it is working correctly. Follow these instructions at home: Take over-the-counter and prescription medicines only as told by your health care provider. Ask if you can take sinus medicine if your sinuses are blocked. Do not use any products that contain nicotine or tobacco. These products include cigarettes, chewing tobacco, and vaping devices, such as e-cigarettes. If you need help quitting, ask your health care provider. Keep all follow-up visits. This is important. Contact a health care provider if: You have redness or pressure sores on your head, face, mouth, or nose from the mask or head gear. You have trouble using the CPAP or BIPAP machine. You cannot tolerate wearing the CPAP or BIPAP mask. Someone tells you that you snore even when wearing your CPAP or BIPAP. Get help right away if: You have trouble breathing.  You feel confused. Summary CPAP and BIPAP are methods that use air pressure to keep your airways open and to help you breathe well. If you have trouble with the mask, it is very important that you talk with your health care provider about finding a way to make the mask easier to tolerate. Do not stop using the mask. There could be a negative impact to your health if you stop using the mask. Follow instructions from your health care provider about when to use the machine. This information is not intended to replace advice given to you by your health care provider. Make sure you discuss any questions you have with your health care provider. Document Revised: 07/05/2021 Document Reviewed: 11/04/2020 Elsevier Patient Education  [employer].

## 2022-09-11 NOTE — Progress Notes (Signed)
Reviewed and agree with assessment/plan.   Chesley Mires, MD [facility] Pulmonary/Critical Care 09/11/2022, 11:23 AM Pager:  [phone]

## 2022-09-12 ENCOUNTER — Telehealth: Payer: Self-pay | Admitting: Primary Care

## 2022-09-12 NOTE — Telephone Encounter (Signed)
Order for cpap was placed 10/3 and the order was faxed to [facility] for pt by [facility] 10/3.  Attempted to call pt but unable to reach and unable to leave VM. Will try to call back later.

## 2022-09-13 DIAGNOSIS — Z23 Encounter for immunization: Secondary | ICD-10-CM | POA: Diagnosis not present

## 2022-09-13 NOTE — Telephone Encounter (Signed)
Spoke with the pt's spouse ok per DPR and notified that we sent his CPAP order to [facility] on 09/11/22. I advised any further questions regarding status of order can be directed to [facility]. Nothing further needed.

## 2022-09-17 ENCOUNTER — Other Ambulatory Visit: Payer: Self-pay | Admitting: Cardiology

## 2022-09-20 ENCOUNTER — Telehealth: Payer: Self-pay | Admitting: Primary Care

## 2022-09-20 NOTE — Telephone Encounter (Signed)
Called and spoke with pt who stated that he has received a cpap machine from [place]. Stated to pt that we were able to view data in [place] and he verbalized understanding. Also scheduled pt a follow up with [facility]. Nothing further needed.

## 2022-10-13 ENCOUNTER — Other Ambulatory Visit: Payer: Self-pay | Admitting: Cardiology

## 2022-10-16 ENCOUNTER — Ambulatory Visit: Payer: PPO | Admitting: Urology

## 2022-10-16 DIAGNOSIS — R3129 Other microscopic hematuria: Secondary | ICD-10-CM

## 2022-10-16 DIAGNOSIS — N5201 Erectile dysfunction due to arterial insufficiency: Secondary | ICD-10-CM

## 2022-10-16 DIAGNOSIS — E8721 Acute metabolic acidosis: Secondary | ICD-10-CM | POA: Diagnosis not present

## 2022-10-16 DIAGNOSIS — N1831 Chronic kidney disease, stage 3a: Secondary | ICD-10-CM | POA: Diagnosis not present

## 2022-10-16 DIAGNOSIS — R350 Frequency of micturition: Secondary | ICD-10-CM

## 2022-10-16 DIAGNOSIS — I129 Hypertensive chronic kidney disease with stage 1 through stage 4 chronic kidney disease, or unspecified chronic kidney disease: Secondary | ICD-10-CM | POA: Diagnosis not present

## 2022-10-16 DIAGNOSIS — N038 Chronic nephritic syndrome with other morphologic changes: Secondary | ICD-10-CM | POA: Diagnosis not present

## 2022-10-16 DIAGNOSIS — R351 Nocturia: Secondary | ICD-10-CM

## 2022-10-16 DIAGNOSIS — E559 Vitamin D deficiency, unspecified: Secondary | ICD-10-CM | POA: Diagnosis not present

## 2022-10-25 ENCOUNTER — Ambulatory Visit: Payer: PPO | Admitting: Cardiology

## 2022-10-28 NOTE — Progress Notes (Unsigned)
$'@Patient'P$  ID: Dillon Strickland, male    DOB: Apr 08, 1951, 71 y.o.   MRN: [id number]  No chief complaint on file.   Referring provider: Asencion Noble, MD  HPI:  71 year old male, never smoked.  Past medical history significant for chronic A-fib on anticoagulation, hyperlipidemia, gout.  09/11/2022 Patient presents today for sleep consult. He has symptoms of snoring and significant daytime sleepiness. He reports being extremley tired during the day. He has not felt like doing anything the last year. His mood is not depressed. He had sleep study on 08/21/22 through [facility] that showed he had severe sleep apnea, AHI 65/hour with Spo2 low 72% (99%). Weight 240lbs. We reviewed treatment options. He is interested in starting CPAP. He plans on working on weight loss. He would like to use [employer] His wife has parkinson's psychosis, she is a Building services engineer for [employer]. Sleep apnea runs in his family. Denies symptoms of narcolepsy, cataplexy or sleepwalking.  Sleep questionnaire Symptoms-   Snoring  Prior sleep study- 08/21/22 >> severe OSA, AHI 65/hr  Bedtime- 10pm Time to fall asleep- 15 mins Nocturnal awakenings- 3-4 times Out of bed/start of day- 7am Weight changes- No Do you operate heavy machinery- No Do you currently wear CPAP- No Do you current wear oxygen- No Epworth- 7  10/29/2022 Patient presents today for follow-up sleep apnea HST 08/21/22 showed severe OSA Started on CPAP in October 2023, he purchased machine    Allergies  Allergen Reactions   Uloric [Febuxostat] Rash    Immunization History  Administered Date(s) Administered   Influenza Split 09/25/2016    Past Medical History:  Diagnosis Date   Chronic anticoagulation    Colonoscopy with polypectomy in 2006; due for a repeat procedure as of 2012   Chronic atrial fibrillation ([place]) 2001   Spontaneous contrast on TEE   Degenerative joint disease of spine    Mild; cervical and  lumbosacral   Gout    Hyperlipidemia    Overweight(278.02)    Pseudogout     Tobacco History: Social History   Tobacco Use  Smoking Status Never   Passive exposure: Past  Smokeless Tobacco Never   Counseling given: Not Answered   Outpatient Medications Prior to Visit  Medication Sig Dispense Refill   allopurinol (ZYLOPRIM) 300 MG tablet Take 300 mg by mouth daily.     atenolol (TENORMIN) 50 MG tablet TAKE ONE TABLET BY MOUTH DAILY. 30 tablet 0   atorvastatin (LIPITOR) 20 MG tablet Take 20 mg by mouth daily.     benzonatate (TESSALON) 200 MG capsule Take 200 mg by mouth 3 (three) times daily.     Cholecalciferol 25 MCG (1000 UT) capsule Take by mouth.     diltiazem (CARTIA XT) 240 MG 24 hr capsule Take 1 capsule (240 mg total) by mouth daily. 30 capsule 1   lisinopril (ZESTRIL) 2.5 MG tablet Take 2.5 mg by mouth daily.     predniSONE (DELTASONE) 20 MG tablet Take 2 tablets (40 mg total) by mouth daily with breakfast. 10 tablet 0   Promethazine HCl 6.25 MG/5ML SOLN Take 5 mLs by mouth every 4 (four) hours as needed.     senna-docusate (SENOKOT-S) 8.6-50 MG tablet Take 1 tablet by mouth 2 (two) times daily as needed for moderate constipation. 30 tablet 0   tamsulosin (FLOMAX) 0.4 MG CAPS capsule Take 1 capsule (0.4 mg total) by mouth daily. 90 capsule 3   warfarin (COUMADIN) 5 MG tablet TAKE 1/2 TABLET BY MOUTH DAILY  EXCEPT 1 TAB ON TUESDAYS AS DIRECTED BY COUMADIN CLINIC. (Patient taking differently: Pt. Takes daily) 30 tablet 5   No facility-administered medications prior to visit.      Review of Systems  Review of Systems   Physical Exam  There were no vitals taken for this visit. Physical Exam   Lab Results:  CBC    Component Value Date/Time   WBC 13.2 (H) 12/02/2021 0452   RBC 3.19 (L) 12/02/2021 0452   HGB 9.8 (L) 12/02/2021 0452   HCT 29.8 (L) 12/02/2021 0452   PLT 198 12/02/2021 0452   MCV 93.4 12/02/2021 0452   MCH 30.7 12/02/2021 0452   MCHC 32.9  12/02/2021 0452   RDW 15.0 12/02/2021 0452   LYMPHSABS 0.3 (L) 11/28/2014 2314   MONOABS 0.3 11/28/2014 2314   EOSABS 0.0 11/28/2014 2314   BASOSABS 0.0 11/28/2014 2314    BMET    Component Value Date/Time   NA 136 12/02/2021 0452   K 4.3 12/02/2021 0452   CL 105 12/02/2021 0452   CO2 25 12/02/2021 0452   GLUCOSE 111 (H) 12/02/2021 0452   BUN 43 (H) 12/02/2021 0452   CREATININE 1.71 (H) 12/02/2021 0452   CREATININE 0.92 02/05/2012 1419   CALCIUM 8.2 (L) 12/02/2021 0452   GFRNONAA 43 (L) 12/02/2021 0452   GFRAA 38 (L) 11/28/2014 2314    BNP No results found for: "BNP"  ProBNP No results found for: "PROBNP"  Imaging: No results found.   Assessment & Plan:   No problem-specific Assessment & Plan notes found for this encounter.     Martyn Ehrich, NP 10/28/2022

## 2022-10-29 ENCOUNTER — Encounter: Payer: Self-pay | Admitting: Primary Care

## 2022-10-29 ENCOUNTER — Ambulatory Visit (INDEPENDENT_AMBULATORY_CARE_PROVIDER_SITE_OTHER): Payer: PPO | Admitting: Primary Care

## 2022-10-29 VITALS — BP 110/64 | HR 76 | Temp 97.7°F | Ht 67.5 in | Wt 244.4 lb

## 2022-10-29 DIAGNOSIS — G4733 Obstructive sleep apnea (adult) (pediatric): Secondary | ICD-10-CM | POA: Diagnosis not present

## 2022-10-29 NOTE — Addendum Note (Signed)
Addended byOralia Rud M on: 10/29/2022 10:30 AM   Modules accepted: Orders

## 2022-10-29 NOTE — Assessment & Plan Note (Addendum)
-   Severe OSA, AHI 65/hours. He was started on auto CPAP in October 2023 with considerable improvement in amount of apneas he has been having. He is 93% compliant with CPAP, average usage 6 hours 24 mins. He reports improvement in fatigue but still snoring. Current pressure 5-15cm h20 (13.5cm h20-95%); Residual AHI 16.1/hour. We will adjust CPAP pressure, recommend increasing auto settings  8-18cm h20. DME company is [employer]. If still having residual apneas we may consider CPAP titration study. Follow-up in 3 months or sooner if needed.

## 2022-10-29 NOTE — Patient Instructions (Addendum)
Recommendations: - Continue to wear CPAP every night 4-6 hours or longer - Do not drive if experiencing excessive daytime sleepiness  - Look at getting CPAP pillow for side sleepers, readjust mask when lying down flat. If you continue to have large amount of airleaks please notify our office   Orders: - Adjust CPAP pressure 8-18cm h20 (DME Psych[facility])   Follow-up: - 3 months with Beth NP   CPAP and BIPAP Information CPAP and BIPAP are methods that use air pressure to keep your airways open and to help you breathe well. CPAP and BIPAP use different amounts of pressure. Your health care provider will tell you whether CPAP or BIPAP would be more helpful for you. CPAP stands for "continuous positive airway pressure." With CPAP, the amount of pressure stays the same while you breathe in (inhale) and out (exhale). BIPAP stands for "bi-level positive airway pressure." With BIPAP, the amount of pressure will be higher when you inhale and lower when you exhale. This allows you to take larger breaths. CPAP or BIPAP may be used in the hospital, or your health care provider may want you to use it at home. You may need to have a sleep study before your health care provider can order a machine for you to use at home. What are the advantages? CPAP or BIPAP can be helpful if you have: Sleep apnea. Chronic obstructive pulmonary disease (COPD). Heart failure. Medical conditions that cause muscle weakness, including muscular dystrophy or amyotrophic lateral sclerosis (ALS). Other problems that cause breathing to be shallow, weak, abnormal, or difficult. CPAP and BIPAP are most commonly used for obstructive sleep apnea (OSA) to keep the airways from collapsing when the muscles relax during sleep. What are the risks? Generally, this is a safe treatment. However, problems may occur, including: Irritated skin or skin sores if the mask does not fit properly. Dry or stuffy nose or nosebleeds. Dry  mouth. Feeling gassy or bloated. Sinus or lung infection if the equipment is not cleaned properly. When should CPAP or BIPAP be used? In most cases, the mask only needs to be worn during sleep. Generally, the mask needs to be worn throughout the night and during any daytime naps. People with certain medical conditions may also need to wear the mask at other times, such as when they are awake. Follow instructions from your health care provider about when to use the machine. What happens during CPAP or BIPAP?  Both CPAP and BIPAP are provided by a small machine with a flexible plastic tube that attaches to a plastic mask that you wear. Air is blown through the mask into your nose or mouth. The amount of pressure that is used to blow the air can be adjusted on the machine. Your health care provider will set the pressure setting and help you find the best mask for you. Tips for using the mask Because the mask needs to be snug, some people feel trapped or closed-in (claustrophobic) when first using the mask. If you feel this way, you may need to get used to the mask. One way to do this is to hold the mask loosely over your nose or mouth and then gradually apply the mask more snugly. You can also gradually increase the amount of time that you use the mask. Masks are available in various types and sizes. If your mask does not fit well, talk with your health care provider about getting a different one. Some common types of masks include: Full face  masks, which fit over the mouth and nose. Nasal masks, which fit over the nose. Nasal pillow or prong masks, which fit into the nostrils. If you are using a mask that fits over your nose and you tend to breathe through your mouth, a chin strap may be applied to help keep your mouth closed. Use a skin barrier to protect your skin as told by your health care provider. Some CPAP and BIPAP machines have alarms that may sound if the mask comes off or develops a  leak. If you have trouble with the mask, it is very important that you talk with your health care provider about finding a way to make the mask easier to tolerate. Do not stop using the mask. There could be a negative impact on your health if you stop using the mask. Tips for using the machine Place your CPAP or BIPAP machine on a secure table or stand near an electrical outlet. Know where the on/off switch is on the machine. Follow instructions from your health care provider about how to set the pressure on your machine and when you should use it. Do not eat or drink while the CPAP or BIPAP machine is on. Food or fluids could get pushed into your lungs by the pressure of the CPAP or BIPAP. For home use, CPAP and BIPAP machines can be rented or purchased through home health care companies. Many different brands of machines are available. Renting a machine before purchasing may help you find out which particular machine works well for you. Your health insurance company may also decide which machine you may get. Keep the CPAP or BIPAP machine and attachments clean. Ask your health care provider for specific instructions. Check the humidifier if you have a dry stuffy nose or nosebleeds. Make sure it is working correctly. Follow these instructions at home: Take over-the-counter and prescription medicines only as told by your health care provider. Ask if you can take sinus medicine if your sinuses are blocked. Do not use any products that contain nicotine or tobacco. These products include cigarettes, chewing tobacco, and vaping devices, such as e-cigarettes. If you need help quitting, ask your health care provider. Keep all follow-up visits. This is important. Contact a health care provider if: You have redness or pressure sores on your head, face, mouth, or nose from the mask or head gear. You have trouble using the CPAP or BIPAP machine. You cannot tolerate wearing the CPAP or BIPAP mask. Someone  tells you that you snore even when wearing your CPAP or BIPAP. Get help right away if: You have trouble breathing. You feel confused. Summary CPAP and BIPAP are methods that use air pressure to keep your airways open and to help you breathe well. If you have trouble with the mask, it is very important that you talk with your health care provider about finding a way to make the mask easier to tolerate. Do not stop using the mask. There could be a negative impact to your health if you stop using the mask. Follow instructions from your health care provider about when to use the machine. This information is not intended to replace advice given to you by your health care provider. Make sure you discuss any questions you have with your health care provider. Document Revised: 07/05/2021 Document Reviewed: 11/04/2020 Elsevier Patient Education  [place].

## 2022-10-30 NOTE — Progress Notes (Signed)
Reviewed and agree with assessment/plan.   Chesley Mires, MD [facility] Pulmonary/Critical Care 10/30/2022, 1:31 PM Pager:  [phone]

## 2022-11-05 ENCOUNTER — Other Ambulatory Visit: Payer: Self-pay | Admitting: Cardiology

## 2022-11-05 DIAGNOSIS — Z5181 Encounter for therapeutic drug level monitoring: Secondary | ICD-10-CM

## 2022-11-05 DIAGNOSIS — I482 Chronic atrial fibrillation, unspecified: Secondary | ICD-10-CM

## 2022-11-05 MED ORDER — WARFARIN SODIUM 5 MG PO TABS: ORAL_TABLET | ORAL | 0 refills | Status: DC

## 2022-11-05 MED ORDER — ATENOLOL 50 MG PO TABS: 50.0000 mg | ORAL_TABLET | Freq: Every day | ORAL | 0 refills | Status: DC

## 2022-11-05 NOTE — Telephone Encounter (Signed)
Refill request for warfarin:  Last INR was 3.4 on 08/06/22 INR past due Has appt with Dr Harl Bowie 02/2023  Pt is past due for INR check.  Limited refill given and pt notified he needs INR check.

## 2022-11-05 NOTE — Addendum Note (Signed)
Addended by: Malen Gauze on: 11/05/2022 11:58 AM   Modules accepted: Orders

## 2022-11-24 ENCOUNTER — Encounter (HOSPITAL_BASED_OUTPATIENT_CLINIC_OR_DEPARTMENT_OTHER): Payer: Self-pay

## 2022-11-24 DIAGNOSIS — N1831 Chronic kidney disease, stage 3a: Secondary | ICD-10-CM

## 2022-11-27 ENCOUNTER — Telehealth: Payer: Self-pay | Admitting: Primary Care

## 2022-11-27 NOTE — Telephone Encounter (Signed)
Printed off sleep study that was uploaded. Faxed to [employer] as ordered. Nothing further needed

## 2022-11-27 NOTE — Telephone Encounter (Signed)
Please send Why sleep study copy so they can [employer]. Lattie Haw calling from there.

## 2022-11-27 NOTE — Telephone Encounter (Signed)
Fax # is [phone]

## 2022-12-06 DIAGNOSIS — I480 Paroxysmal atrial fibrillation: Secondary | ICD-10-CM | POA: Diagnosis not present

## 2022-12-06 DIAGNOSIS — F325 Major depressive disorder, single episode, in full remission: Secondary | ICD-10-CM | POA: Diagnosis not present

## 2022-12-06 DIAGNOSIS — M109 Gout, unspecified: Secondary | ICD-10-CM | POA: Diagnosis not present

## 2022-12-17 ENCOUNTER — Other Ambulatory Visit: Payer: Self-pay | Admitting: Cardiology

## 2023-01-08 ENCOUNTER — Other Ambulatory Visit: Payer: Self-pay | Admitting: Cardiology

## 2023-01-09 DIAGNOSIS — N1831 Chronic kidney disease, stage 3a: Secondary | ICD-10-CM | POA: Diagnosis not present

## 2023-01-09 DIAGNOSIS — E875 Hyperkalemia: Secondary | ICD-10-CM | POA: Diagnosis not present

## 2023-01-09 DIAGNOSIS — R809 Proteinuria, unspecified: Secondary | ICD-10-CM | POA: Diagnosis not present

## 2023-01-09 DIAGNOSIS — I129 Hypertensive chronic kidney disease with stage 1 through stage 4 chronic kidney disease, or unspecified chronic kidney disease: Secondary | ICD-10-CM | POA: Diagnosis not present

## 2023-01-09 DIAGNOSIS — E8721 Acute metabolic acidosis: Secondary | ICD-10-CM | POA: Diagnosis not present

## 2023-01-09 DIAGNOSIS — Z79899 Other long term (current) drug therapy: Secondary | ICD-10-CM | POA: Diagnosis not present

## 2023-01-09 DIAGNOSIS — N038 Chronic nephritic syndrome with other morphologic changes: Secondary | ICD-10-CM | POA: Diagnosis not present

## 2023-01-14 DIAGNOSIS — N038 Chronic nephritic syndrome with other morphologic changes: Secondary | ICD-10-CM | POA: Diagnosis not present

## 2023-01-14 DIAGNOSIS — G4733 Obstructive sleep apnea (adult) (pediatric): Secondary | ICD-10-CM | POA: Diagnosis not present

## 2023-01-14 DIAGNOSIS — I129 Hypertensive chronic kidney disease with stage 1 through stage 4 chronic kidney disease, or unspecified chronic kidney disease: Secondary | ICD-10-CM | POA: Diagnosis not present

## 2023-01-14 DIAGNOSIS — R809 Proteinuria, unspecified: Secondary | ICD-10-CM | POA: Diagnosis not present

## 2023-01-14 DIAGNOSIS — Z6838 Body mass index (BMI) 38.0-38.9, adult: Secondary | ICD-10-CM | POA: Diagnosis not present

## 2023-01-14 DIAGNOSIS — E559 Vitamin D deficiency, unspecified: Secondary | ICD-10-CM | POA: Diagnosis not present

## 2023-01-14 DIAGNOSIS — N1831 Chronic kidney disease, stage 3a: Secondary | ICD-10-CM | POA: Diagnosis not present

## 2023-01-21 IMAGING — DX DG FOOT COMPLETE 3+V*R*
3 series · 3 of 3 positions shown · non-contrast
Comparison: 09/30/2018

CLINICAL DATA: Right foot pain

EXAM:
RIGHT FOOT COMPLETE - 3+ VIEW

[foot ap]
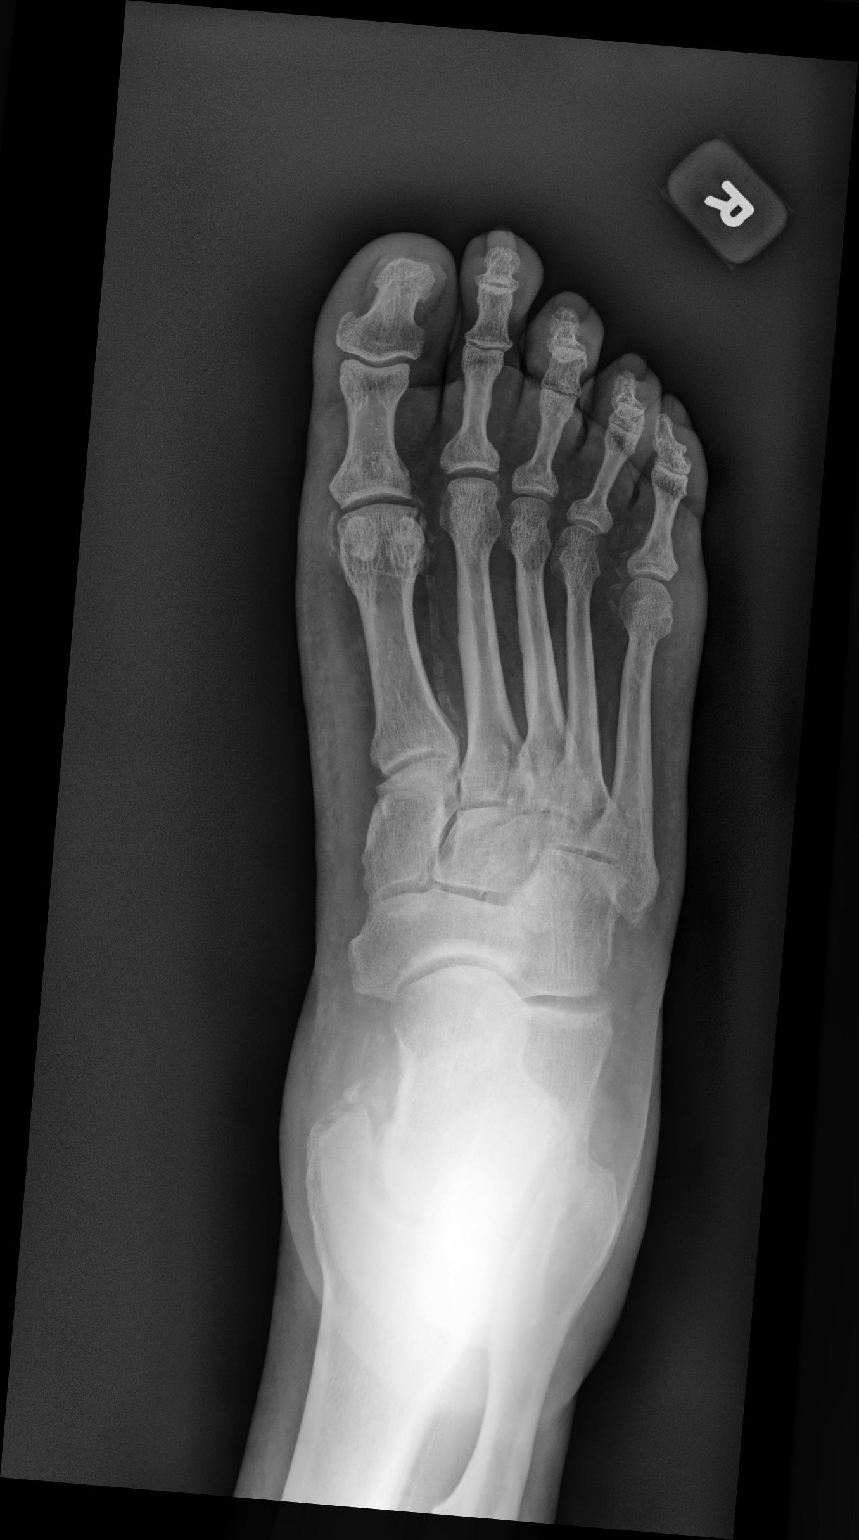

[foot mlo]
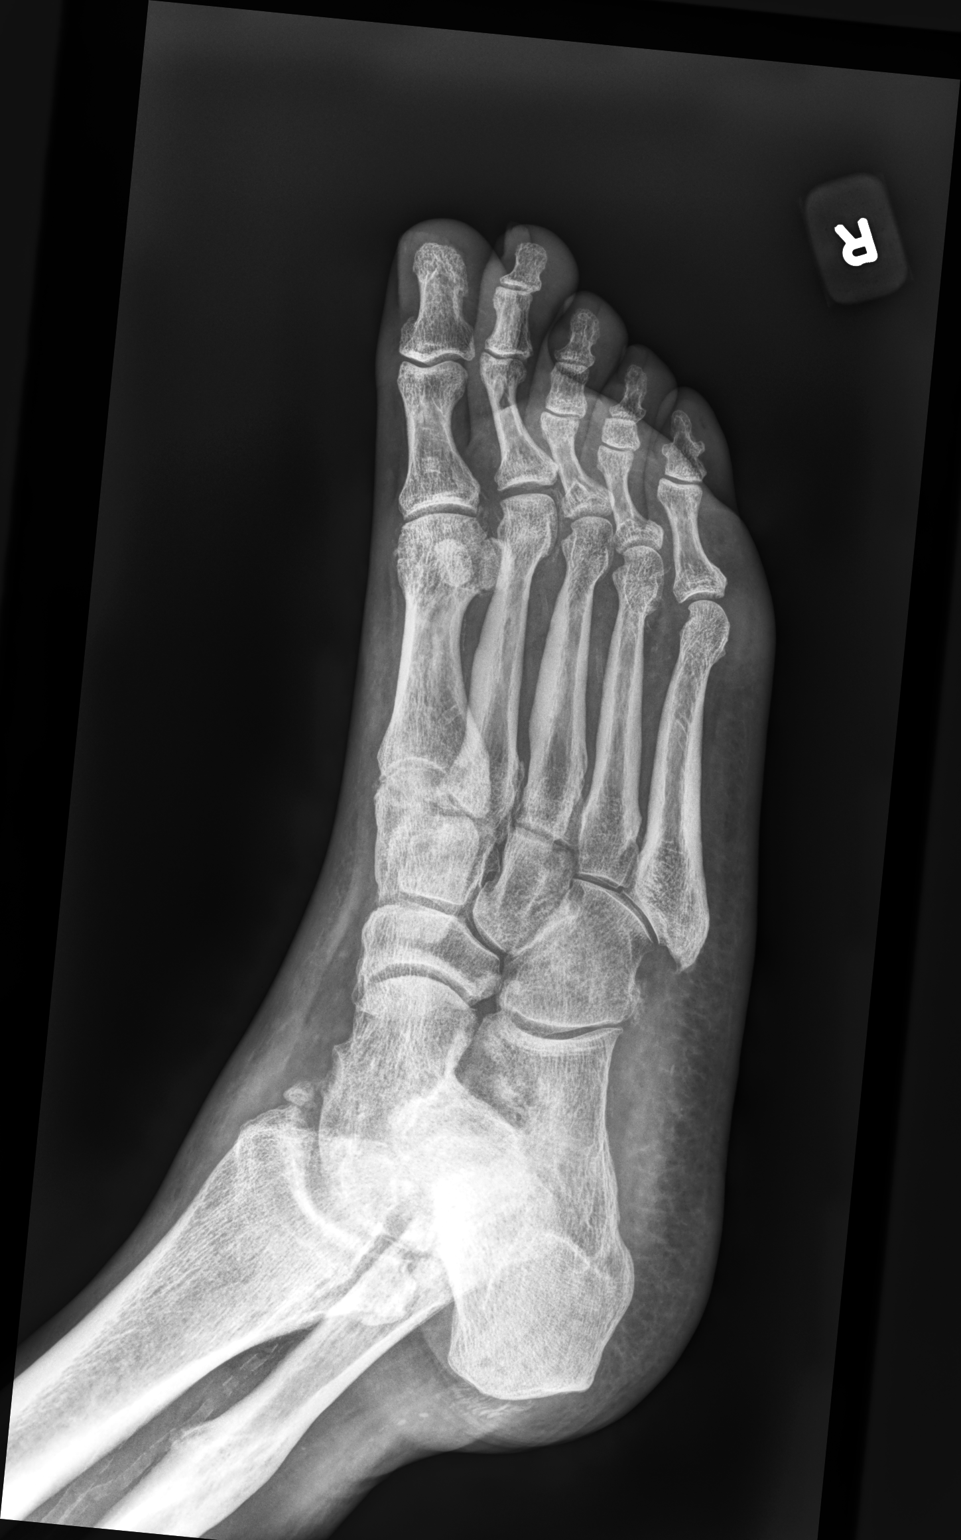

[foot lat]
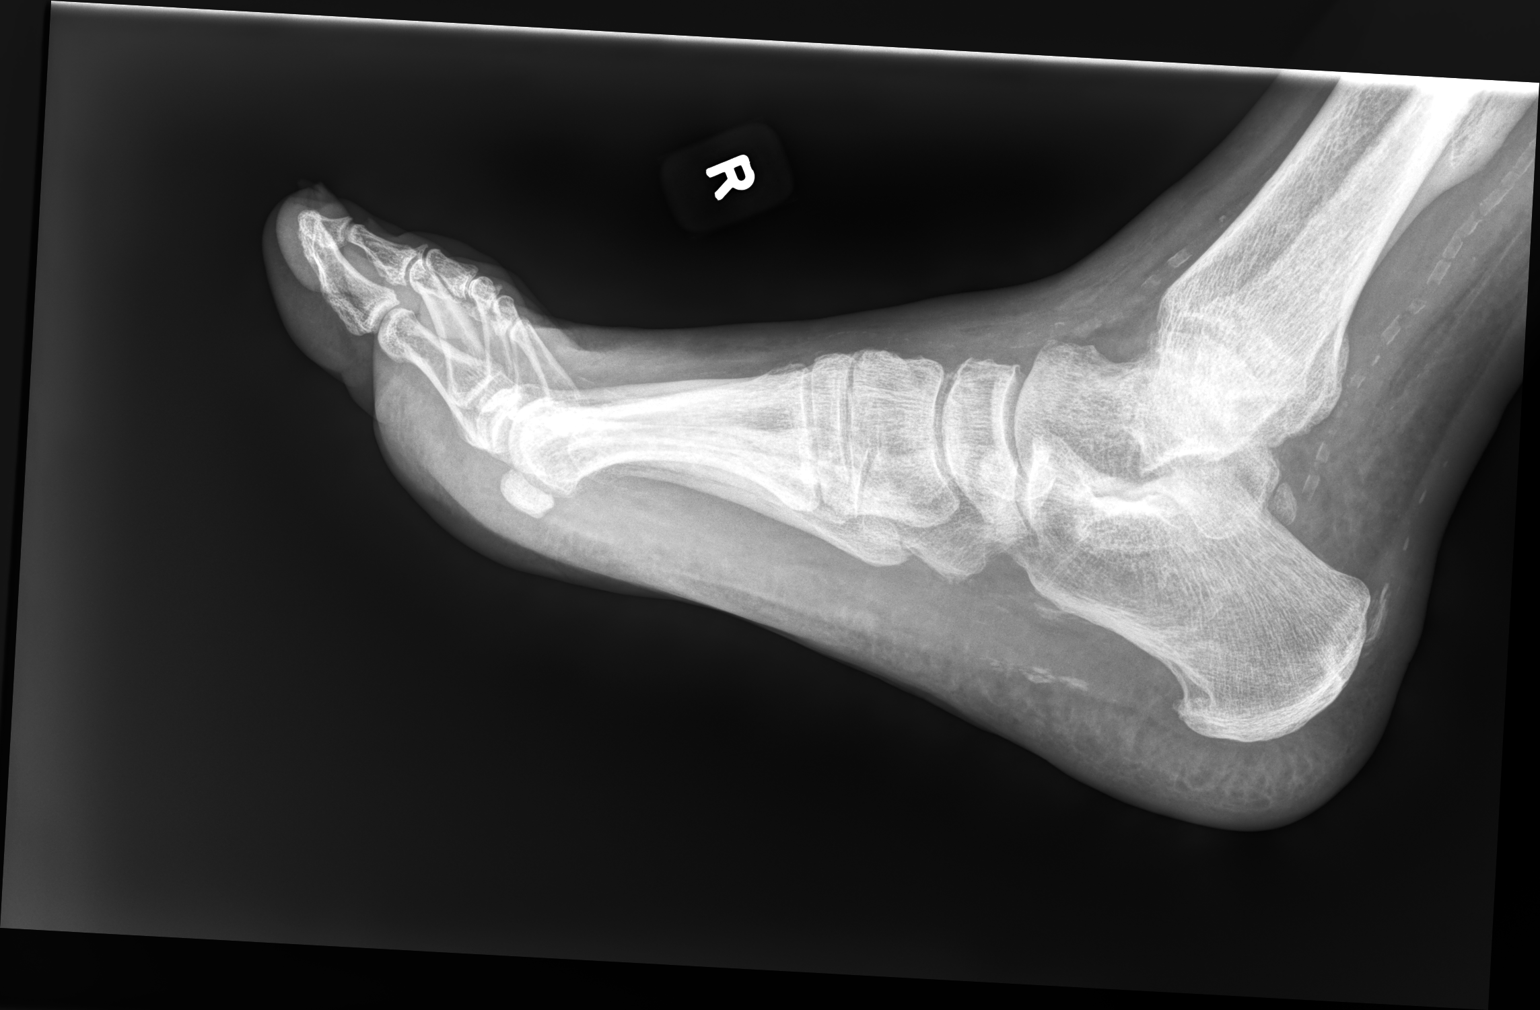

[3 of 3 positions shown; findings below may reference images not displayed]

FINDINGS: Negative for fracture. Mild degenerative change first MTP joint.
Mild degenerative change in the midfoot. Arterial calcification.
IMPRESSION: Negative for fracture. Degenerative change in the first MTP joint
and in the midfoot

## 2023-01-24 DIAGNOSIS — R32 Unspecified urinary incontinence: Secondary | ICD-10-CM | POA: Diagnosis not present

## 2023-01-24 DIAGNOSIS — M109 Gout, unspecified: Secondary | ICD-10-CM | POA: Diagnosis not present

## 2023-01-24 DIAGNOSIS — N400 Enlarged prostate without lower urinary tract symptoms: Secondary | ICD-10-CM | POA: Diagnosis not present

## 2023-01-24 DIAGNOSIS — D6869 Other thrombophilia: Secondary | ICD-10-CM | POA: Diagnosis not present

## 2023-01-24 DIAGNOSIS — G4733 Obstructive sleep apnea (adult) (pediatric): Secondary | ICD-10-CM | POA: Diagnosis not present

## 2023-01-24 DIAGNOSIS — N189 Chronic kidney disease, unspecified: Secondary | ICD-10-CM | POA: Diagnosis not present

## 2023-01-24 DIAGNOSIS — I4891 Unspecified atrial fibrillation: Secondary | ICD-10-CM | POA: Diagnosis not present

## 2023-01-24 DIAGNOSIS — E785 Hyperlipidemia, unspecified: Secondary | ICD-10-CM | POA: Diagnosis not present

## 2023-01-24 DIAGNOSIS — I10 Essential (primary) hypertension: Secondary | ICD-10-CM | POA: Diagnosis not present

## 2023-01-29 ENCOUNTER — Ambulatory Visit (INDEPENDENT_AMBULATORY_CARE_PROVIDER_SITE_OTHER): Payer: PPO | Admitting: Primary Care

## 2023-01-29 ENCOUNTER — Encounter: Payer: Self-pay | Admitting: Primary Care

## 2023-01-29 VITALS — BP 112/78 | HR 69 | Ht 67.0 in | Wt 245.0 lb

## 2023-01-29 DIAGNOSIS — G4733 Obstructive sleep apnea (adult) (pediatric): Secondary | ICD-10-CM

## 2023-01-29 NOTE — Progress Notes (Signed)
$@Patientv$  ID: Dillon Strickland, male    DOB: 04-21-1951, 72 y.o.   MRN: [id number]  Chief Complaint  Patient presents with   Follow-up    OSA    Referring provider: Asencion Noble, MD  HPI: 72 year old male, never smoked.  Past medical history significant for chronic A-fib on anticoagulation, hyperlipidemia, gout.  Previous [facility] pulmonary encounter:  09/11/2022 Patient presents today for sleep consult. He has symptoms of snoring and significant daytime sleepiness. He reports being extremley tired during the day. He has not felt like doing anything the last year. His mood is not depressed. He had sleep study on 08/21/22 through [facility] that showed he had severe sleep apnea, AHI 65/hour with Spo2 low 72% (99%). Weight 240lbs. We reviewed treatment options. He is interested in starting CPAP. He plans on working on weight loss. He would like to use [employer]. His wife has parkinson's psychosis, she is a Building services engineer for [employer]. Sleep apnea runs in his family. Denies symptoms of narcolepsy, cataplexy or sleepwalking.  Sleep questionnaire Symptoms-   Snoring  Prior sleep study- 08/21/22 >> severe OSA, AHI 65/hr  Bedtime- 10pm Time to fall asleep- 15 mins Nocturnal awakenings- 3-4 times Out of bed/start of day- 7am Weight changes- No Do you operate heavy machinery- No Do you currently wear CPAP- No Do you current wear oxygen- No Epworth- 7  10/29/2022 Patient presents today for follow-up sleep apnea. HST 08/21/22 showed severe OSA, AHI 65/hour. Started on CPAP in October 2023, he purchased his CPAP machine and was able to get reimbursed. He is feeling better. He reports more energy. He feels rested when he wakes up. He is still snoring. No difficulties falling back to sleep when he wakes up at night. No issues with mask fit or pressure settings. He is using hybrid full face mask. DME company is [facility].   Airview download 09/26/22-10/25/22 Usage  28/30 days; 93% > 4 hours Average usage 6 hours 24 mins Pressure 5-15cm h20 (13.5cm h20-95%) Airleaks 35.5L/min (95%)  AHI 16.1/hour  01/29/2023- Interim hx  Patient presents today for OSA follow-up. He is doing well. Reports significant benefit since starting CPAP. He no longer needs to take a nap during the day and feels more energized. He is experiencing some airleaks from his mask at night, he attributes this to his facial hair/beard. He uses [employer] dreamwear hybrid full face mask size large. He has not change mask or supplies since he received CPAP in October 2023. DME is [facility].   Airview download 12/29/22-01/27/23 Usage 25/30 days (83%); 19 days (63%) Usage days used 5 hours 16 mins Pressure 8-18cm h20 (12.9cm h20-95%) Airleaks 43.9L/min (95%) AHI 14.2    Allergies  Allergen Reactions   Uloric [Febuxostat] Rash    Immunization History  Administered Date(s) Administered   Influenza Split 09/25/2016    Past Medical History:  Diagnosis Date   Chronic anticoagulation    Colonoscopy with polypectomy in 2006; due for a repeat procedure as of 2012   Chronic atrial fibrillation ([place]) 2001   Spontaneous contrast on TEE   Degenerative joint disease of spine    Mild; cervical and lumbosacral   Gout    Hyperlipidemia    Overweight(278.02)    Pseudogout     Tobacco History: Social History   Tobacco Use  Smoking Status Never   Passive exposure: Past  Smokeless Tobacco Never   Counseling given: Not Answered   Outpatient Medications Prior to Visit  Medication Sig Dispense  Refill   allopurinol (ZYLOPRIM) 300 MG tablet Take 300 mg by mouth daily.     atenolol (TENORMIN) 50 MG tablet TAKE ONE TABLET BY MOUTH DAILY. 90 tablet 1   atorvastatin (LIPITOR) 20 MG tablet Take 20 mg by mouth daily.     diltiazem (CARTIA XT) 240 MG 24 hr capsule Take 1 capsule (240 mg total) by mouth daily. 30 capsule 1   lisinopril (ZESTRIL) 2.5 MG tablet Take 2.5 mg by mouth daily.      warfarin (COUMADIN) 5 MG tablet TAKE 1/2 TABLET BY MOUTH DAILY OR AS DIRECTED BY COUMADIN CLINIC. Needs to make a coumadin appt before more refills can be given. 15 tablet 0   benzonatate (TESSALON) 200 MG capsule Take 200 mg by mouth 3 (three) times daily. (Patient not taking: Reported on 10/29/2022)     Cholecalciferol 25 MCG (1000 UT) capsule Take by mouth. (Patient not taking: Reported on 10/29/2022)     predniSONE (DELTASONE) 20 MG tablet Take 2 tablets (40 mg total) by mouth daily with breakfast. (Patient not taking: Reported on 10/29/2022) 10 tablet 0   Promethazine HCl 6.25 MG/5ML SOLN Take 5 mLs by mouth every 4 (four) hours as needed. (Patient not taking: Reported on 10/29/2022)     senna-docusate (SENOKOT-S) 8.6-50 MG tablet Take 1 tablet by mouth 2 (two) times daily as needed for moderate constipation. (Patient not taking: Reported on 10/29/2022) 30 tablet 0   tamsulosin (FLOMAX) 0.4 MG CAPS capsule Take 1 capsule (0.4 mg total) by mouth daily. (Patient not taking: Reported on 10/29/2022) 90 capsule 3   No facility-administered medications prior to visit.    Review of Systems  Review of Systems  Constitutional: Negative.  Negative for fatigue.  HENT: Negative.    Respiratory: Negative.    Cardiovascular: Negative.    Physical Exam  BP 112/78 (BP Location: Right Arm, Patient Position: Sitting, Cuff Size: Large)   Pulse 69   Ht 5' 7"$  (1.702 m)   Wt 245 lb (111.1 kg)   SpO2 97%   BMI 38.37 kg/m  Physical Exam Constitutional:      Appearance: Normal appearance.  HENT:     Head: Normocephalic and atraumatic.  Cardiovascular:     Rate and Rhythm: Normal rate. Rhythm irregular.     Comments: Hx afib, rate controlled Pulmonary:     Effort: Pulmonary effort is normal.     Breath sounds: Normal breath sounds.  Musculoskeletal:        General: Normal range of motion.  Skin:    General: Skin is warm and dry.  Neurological:     General: No focal deficit present.      Mental Status: He is alert. Mental status is at baseline.  Psychiatric:        Mood and Affect: Mood normal.        Behavior: Behavior normal.        Thought Content: Thought content normal.        Judgment: Judgment normal.      Lab Results:  CBC    Component Value Date/Time   WBC 13.2 (H) 12/02/2021 0452   RBC 3.19 (L) 12/02/2021 0452   HGB 9.8 (L) 12/02/2021 0452   HCT 29.8 (L) 12/02/2021 0452   PLT 198 12/02/2021 0452   MCV 93.4 12/02/2021 0452   MCH 30.7 12/02/2021 0452   MCHC 32.9 12/02/2021 0452   RDW 15.0 12/02/2021 0452   LYMPHSABS 0.3 (L) 11/28/2014 2314   MONOABS 0.3 11/28/2014 2314  EOSABS 0.0 11/28/2014 2314   BASOSABS 0.0 11/28/2014 2314    BMET    Component Value Date/Time   NA 136 12/02/2021 0452   K 4.3 12/02/2021 0452   CL 105 12/02/2021 0452   CO2 25 12/02/2021 0452   GLUCOSE 111 (H) 12/02/2021 0452   BUN 43 (H) 12/02/2021 0452   CREATININE 1.71 (H) 12/02/2021 0452   CREATININE 0.92 02/05/2012 1419   CALCIUM 8.2 (L) 12/02/2021 0452   GFRNONAA 43 (L) 12/02/2021 0452   GFRAA 38 (L) 11/28/2014 2314    BNP No results found for: "BNP"  ProBNP No results found for: "PROBNP"  Imaging: No results found.   Assessment & Plan:   OSA (obstructive sleep apnea) - HST 08/21/22 showed severe OSA, AHI 65/hour. Started on CPAP in October 2023. He reports significant improvement in daytime sleepiness since starting PAP therapy. Current pressure 8-18cm h20 (12.9cm h20-95%) with residual AHI 14.2/hr. Experiencing large amount of airleaks. He is still having some breakthrough apneas but overall much improved. Advise he replace full face mask as it has been over 3 months since he received a new mask. Recommend changing pressure from auto settings to set pressure 13cm h20. Encourage weight loss efforts. Renew supplies with DME [employer] FU in 3 months or sooner if needed.    Martyn Ehrich, NP 01/29/2023

## 2023-01-29 NOTE — Progress Notes (Signed)
Reviewed and agree with assessment/plan.   Chesley Mires, MD [facility] Pulmonary/Critical Care 01/29/2023, 11:05 AM Pager:  [phone]

## 2023-01-29 NOTE — Assessment & Plan Note (Addendum)
-   HST 08/21/22 showed severe OSA, AHI 65/hour. Started on CPAP in October 2023. He reports significant improvement in daytime sleepiness since starting PAP therapy. Current pressure 8-18cm h20 (12.9cm h20-95%) with residual AHI 14.2/hr. Experiencing large amount of airleaks. He is still having some breakthrough apneas but overall much improved. Advise he replace full face mask as it has been over 3 months since he received a new mask. Recommend changing pressure from auto settings to set pressure 13cm h20. Encourage weight loss efforts. Renew supplies with DME [employer] FU in 3 months or sooner if needed.

## 2023-01-29 NOTE — Patient Instructions (Addendum)
Recommendations: - Continue to wear CPAP every night 4-6 hours  - Work on weight loss efforts as able (goal 230-235lb by next visit in 3 months) - Change out CPAP filter monthly/ mask and tubing every 3 months / headgear is every 6 months   Orders: - Please provide patient with CPAP supplies/ replacement filters, philips dreamwear hybrid full face mask size large, tubing and headgear (DME Psychiatrist) - Change CPAP pressure 13cm h20   Follow-up: - 3-4 months with Beth NP   CPAP and BIPAP Information CPAP and BIPAP are methods that use air pressure to keep your airways open and to help you breathe well. CPAP and BIPAP use different amounts of pressure. Your health care provider will tell you whether CPAP or BIPAP would be more helpful for you. CPAP stands for "continuous positive airway pressure." With CPAP, the amount of pressure stays the same while you breathe in (inhale) and out (exhale). BIPAP stands for "bi-level positive airway pressure." With BIPAP, the amount of pressure will be higher when you inhale and lower when you exhale. This allows you to take larger breaths. CPAP or BIPAP may be used in the hospital, or your health care provider may want you to use it at home. You may need to have a sleep study before your health care provider can order a machine for you to use at home. What are the advantages? CPAP or BIPAP can be helpful if you have: Sleep apnea. Chronic obstructive pulmonary disease (COPD). Heart failure. Medical conditions that cause muscle weakness, including muscular dystrophy or amyotrophic lateral sclerosis (ALS). Other problems that cause breathing to be shallow, weak, abnormal, or difficult. CPAP and BIPAP are most commonly used for obstructive sleep apnea (OSA) to keep the airways from collapsing when the muscles relax during sleep. What are the risks? Generally, this is a safe treatment. However, problems may occur, including: Irritated skin or skin  sores if the mask does not fit properly. Dry or stuffy nose or nosebleeds. Dry mouth. Feeling gassy or bloated. Sinus or lung infection if the equipment is not cleaned properly. When should CPAP or BIPAP be used? In most cases, the mask only needs to be worn during sleep. Generally, the mask needs to be worn throughout the night and during any daytime naps. People with certain medical conditions may also need to wear the mask at other times, such as when they are awake. Follow instructions from your health care provider about when to use the machine. What happens during CPAP or BIPAP?  Both CPAP and BIPAP are provided by a small machine with a flexible plastic tube that attaches to a plastic mask that you wear. Air is blown through the mask into your nose or mouth. The amount of pressure that is used to blow the air can be adjusted on the machine. Your health care provider will set the pressure setting and help you find the best mask for you. Tips for using the mask Because the mask needs to be snug, some people feel trapped or closed-in (claustrophobic) when first using the mask. If you feel this way, you may need to get used to the mask. One way to do this is to hold the mask loosely over your nose or mouth and then gradually apply the mask more snugly. You can also gradually increase the amount of time that you use the mask. Masks are available in various types and sizes. If your mask does not fit well, talk with your health  care provider about getting a different one. Some common types of masks include: Full face masks, which fit over the mouth and nose. Nasal masks, which fit over the nose. Nasal pillow or prong masks, which fit into the nostrils. If you are using a mask that fits over your nose and you tend to breathe through your mouth, a chin strap may be applied to help keep your mouth closed. Use a skin barrier to protect your skin as told by your health care provider. Some CPAP and BIPAP  machines have alarms that may sound if the mask comes off or develops a leak. If you have trouble with the mask, it is very important that you talk with your health care provider about finding a way to make the mask easier to tolerate. Do not stop using the mask. There could be a negative impact on your health if you stop using the mask. Tips for using the machine Place your CPAP or BIPAP machine on a secure table or stand near an electrical outlet. Know where the on/off switch is on the machine. Follow instructions from your health care provider about how to set the pressure on your machine and when you should use it. Do not eat or drink while the CPAP or BIPAP machine is on. Food or fluids could get pushed into your lungs by the pressure of the CPAP or BIPAP. For home use, CPAP and BIPAP machines can be rented or purchased through home health care companies. Many different brands of machines are available. Renting a machine before purchasing may help you find out which particular machine works well for you. Your health insurance company may also decide which machine you may get. Keep the CPAP or BIPAP machine and attachments clean. Ask your health care provider for specific instructions. Check the humidifier if you have a dry stuffy nose or nosebleeds. Make sure it is working correctly. Follow these instructions at home: Take over-the-counter and prescription medicines only as told by your health care provider. Ask if you can take sinus medicine if your sinuses are blocked. Do not use any products that contain nicotine or tobacco. These products include cigarettes, chewing tobacco, and vaping devices, such as e-cigarettes. If you need help quitting, ask your health care provider. Keep all follow-up visits. This is important. Contact a health care provider if: You have redness or pressure sores on your head, face, mouth, or nose from the mask or head gear. You have trouble using the CPAP or BIPAP  machine. You cannot tolerate wearing the CPAP or BIPAP mask. Someone tells you that you snore even when wearing your CPAP or BIPAP. Get help right away if: You have trouble breathing. You feel confused. Summary CPAP and BIPAP are methods that use air pressure to keep your airways open and to help you breathe well. If you have trouble with the mask, it is very important that you talk with your health care provider about finding a way to make the mask easier to tolerate. Do not stop using the mask. There could be a negative impact to your health if you stop using the mask. Follow instructions from your health care provider about when to use the machine. This information is not intended to replace advice given to you by your health care provider. Make sure you discuss any questions you have with your health care provider. Document Revised: 07/05/2021 Document Reviewed: 11/04/2020 Elsevier Patient Education  [place].

## 2023-02-07 DIAGNOSIS — G4733 Obstructive sleep apnea (adult) (pediatric): Secondary | ICD-10-CM | POA: Diagnosis not present

## 2023-02-19 ENCOUNTER — Other Ambulatory Visit: Payer: Self-pay | Admitting: Cardiology

## 2023-02-19 DIAGNOSIS — I482 Chronic atrial fibrillation, unspecified: Secondary | ICD-10-CM

## 2023-02-19 DIAGNOSIS — Z5181 Encounter for therapeutic drug level monitoring: Secondary | ICD-10-CM

## 2023-03-06 ENCOUNTER — Ambulatory Visit: Payer: PPO | Attending: Cardiology | Admitting: Cardiology

## 2023-03-06 NOTE — Progress Notes (Deleted)
Clinical Summary Dillon Strickland is a 72 y.o.male  seen today for follow up of the following medical problems.    1. Chronic afib - a trial of eliquis caused some fatigue per his report, he is happy with coumadin.      - no recent palpitations - no bleeding on coumadin.    2. Hyperlipidemia - labs followed by pcp - he is compliant with statin     3. Vasculitis - followed at [facility] rheumatology - ANCA + vasculitis. Had been on methotrexate, now off  4. OSA - followed by Dr Halford Chessman   Past Medical History:  Diagnosis Date   Chronic anticoagulation    Colonoscopy with polypectomy in 2006; due for a repeat procedure as of 2012   Chronic atrial fibrillation ([place]) 2001   Spontaneous contrast on TEE   Degenerative joint disease of spine    Mild; cervical and lumbosacral   Gout    Hyperlipidemia    Overweight(278.02)    Pseudogout      Allergies  Allergen Reactions   Uloric [Febuxostat] Rash     Current Outpatient Medications  Medication Sig Dispense Refill   allopurinol (ZYLOPRIM) 300 MG tablet Take 300 mg by mouth daily.     atenolol (TENORMIN) 50 MG tablet TAKE ONE TABLET BY MOUTH DAILY. 90 tablet 1   atorvastatin (LIPITOR) 20 MG tablet Take 20 mg by mouth daily.     benzonatate (TESSALON) 200 MG capsule Take 200 mg by mouth 3 (three) times daily. (Patient not taking: Reported on 10/29/2022)     diltiazem (CARTIA XT) 240 MG 24 hr capsule Take 1 capsule (240 mg total) by mouth daily. 30 capsule 1   lisinopril (ZESTRIL) 2.5 MG tablet Take 2.5 mg by mouth daily.     predniSONE (DELTASONE) 20 MG tablet Take 2 tablets (40 mg total) by mouth daily with breakfast. (Patient not taking: Reported on 10/29/2022) 10 tablet 0   Promethazine HCl 6.25 MG/5ML SOLN Take 5 mLs by mouth every 4 (four) hours as needed. (Patient not taking: Reported on 10/29/2022)     senna-docusate (SENOKOT-S) 8.6-50 MG tablet Take 1 tablet by mouth 2 (two) times daily as needed for moderate  constipation. (Patient not taking: Reported on 10/29/2022) 30 tablet 0   tamsulosin (FLOMAX) 0.4 MG CAPS capsule Take 1 capsule (0.4 mg total) by mouth daily. (Patient not taking: Reported on 10/29/2022) 90 capsule 3   warfarin (COUMADIN) 5 MG tablet TAKE 1/2 TABLET BY MOUTH DAILY OR AS DIRECTED BY COUMADIN CLINIC.*NEEDS APPOINTMENT FOR MORE REFILLS* 15 tablet 0   No current facility-administered medications for this visit.     Past Surgical History:  Procedure Laterality Date   COLONOSCOPY N/A 04/23/2013   Procedure: COLONOSCOPY;  Surgeon: Rogene Houston, MD;  [facility];  Service: Endoscopy;  Laterality: N/A;  1030-rescheduled to 6 Ann notified pt   COLONOSCOPY W/ POLYPECTOMY  2006     Allergies  Allergen Reactions   Uloric [Febuxostat] Rash      Family History  Problem Relation Age of Onset   Stroke Mother    Cancer Mother    Heart attack Father    Arrhythmia Sister        Atrial fibrillation   Arrhythmia Brother    Arrhythmia Brother    Diabetes Other    Heart attack Other    Colon cancer Neg Hx      Social History Mr. Huster reports that he has never  smoked. He has been exposed to tobacco smoke. He has never used smokeless tobacco. Mr. Verble reports no history of alcohol use.   Review of Systems CONSTITUTIONAL: No weight loss, fever, chills, weakness or fatigue.  HEENT: Eyes: No visual loss, blurred vision, double vision or yellow sclerae.No hearing loss, sneezing, congestion, runny nose or sore throat.  SKIN: No rash or itching.  CARDIOVASCULAR:  RESPIRATORY: No shortness of breath, cough or sputum.  GASTROINTESTINAL: No anorexia, nausea, vomiting or diarrhea. No abdominal pain or blood.  GENITOURINARY: No burning on urination, no polyuria NEUROLOGICAL: No headache, dizziness, syncope, paralysis, ataxia, numbness or tingling in the extremities. No change in bowel or bladder control.  MUSCULOSKELETAL: No muscle, back pain, joint pain or  stiffness.  LYMPHATICS: No enlarged nodes. No history of splenectomy.  PSYCHIATRIC: No history of depression or anxiety.  ENDOCRINOLOGIC: No reports of sweating, cold or heat intolerance. No polyuria or polydipsia.  Marland Kitchen   Physical Examination There were no vitals filed for this visit. There were no vitals filed for this visit.  Gen: resting comfortably, no acute distress HEENT: no scleral icterus, pupils equal round and reactive, no palptable cervical adenopathy,  CV Resp: Clear to auscultation bilaterally GI: abdomen is soft, non-tender, non-distended, normal bowel sounds, no hepatosplenomegaly MSK: extremities are warm, no edema.  Skin: warm, no rash Neuro:  no focal deficits Psych: appropriate affect   Diagnostic Studies     Assessment and Plan   1. Afib  denies any symptoms - has not had interest in NOACs, continue coumadin  - EKG today shows rate controlled afib   2. Hyperlipidemia -request pcp labs, continue statin     Arnoldo Lenis, M.D.

## 2023-03-19 ENCOUNTER — Other Ambulatory Visit: Payer: Self-pay | Admitting: Cardiology

## 2023-03-19 DIAGNOSIS — Z5181 Encounter for therapeutic drug level monitoring: Secondary | ICD-10-CM

## 2023-03-19 DIAGNOSIS — I482 Chronic atrial fibrillation, unspecified: Secondary | ICD-10-CM

## 2023-04-08 DIAGNOSIS — M5430 Sciatica, unspecified side: Secondary | ICD-10-CM | POA: Diagnosis not present

## 2023-04-08 DIAGNOSIS — I4821 Permanent atrial fibrillation: Secondary | ICD-10-CM | POA: Diagnosis not present

## 2023-04-09 ENCOUNTER — Ambulatory Visit: Payer: PPO | Attending: Cardiology | Admitting: *Deleted

## 2023-04-09 DIAGNOSIS — N1831 Chronic kidney disease, stage 3a: Secondary | ICD-10-CM | POA: Diagnosis not present

## 2023-04-09 DIAGNOSIS — Z5181 Encounter for therapeutic drug level monitoring: Secondary | ICD-10-CM

## 2023-04-09 DIAGNOSIS — I482 Chronic atrial fibrillation, unspecified: Secondary | ICD-10-CM

## 2023-04-09 LAB — POCT INR: INR: 3 (ref 2.0–3.0)

## 2023-04-09 NOTE — Patient Instructions (Signed)
Continue warfarin 1/2 tablet daily  Continue greens/salads Recheck in 8 weeks.

## 2023-04-18 ENCOUNTER — Other Ambulatory Visit: Payer: Self-pay | Admitting: Cardiology

## 2023-04-18 DIAGNOSIS — Z5181 Encounter for therapeutic drug level monitoring: Secondary | ICD-10-CM

## 2023-04-18 DIAGNOSIS — I482 Chronic atrial fibrillation, unspecified: Secondary | ICD-10-CM

## 2023-04-18 NOTE — Telephone Encounter (Signed)
Refill request for warfarin:  Last INR was 3.0 on 04/09/23 Next INR due 06/04/23 LOV was 01/24/22  Refill approved.

## 2023-04-22 ENCOUNTER — Ambulatory Visit: Payer: PPO | Attending: Nurse Practitioner | Admitting: Nurse Practitioner

## 2023-04-22 ENCOUNTER — Other Ambulatory Visit: Payer: Self-pay | Admitting: *Deleted

## 2023-04-22 VITALS — BP 106/60 | HR 60 | Ht 67.0 in | Wt 245.4 lb

## 2023-04-22 DIAGNOSIS — E785 Hyperlipidemia, unspecified: Secondary | ICD-10-CM

## 2023-04-22 DIAGNOSIS — I482 Chronic atrial fibrillation, unspecified: Secondary | ICD-10-CM | POA: Diagnosis not present

## 2023-04-22 DIAGNOSIS — Z5181 Encounter for therapeutic drug level monitoring: Secondary | ICD-10-CM

## 2023-04-22 NOTE — Patient Instructions (Addendum)

## 2023-04-22 NOTE — Progress Notes (Signed)
Office Visit    Patient Name: Dillon Strickland Date of Encounter: 04/22/2023  PCP:  Carylon Perches, MD   [facility]  Cardiologist:  Dina Rich, MD  Advanced Practice Provider:  No care team member to display Electrophysiologist:  None   Chief Complaint    Dillon Strickland is a 72 y.o. male with a hx of chronic A-fib, hyperlipidemia, history of gout/pseudogout, vasculitis, who presents today for 1 year follow-up.  Past Medical History    Past Medical History:  Diagnosis Date   Chronic anticoagulation    Colonoscopy with polypectomy in 2006; due for a repeat procedure as of 2012   Chronic atrial fibrillation ([facility]) 2001   Spontaneous contrast on TEE   Degenerative joint disease of spine    Mild; cervical and lumbosacral   Gout    Hyperlipidemia    Overweight(278.02)    Pseudogout    Past Surgical History:  Procedure Laterality Date   COLONOSCOPY N/A 04/23/2013   Procedure: COLONOSCOPY;  Surgeon: Malissa Hippo, MD;  Location[facility];  Service: Endoscopy;  Laterality: N/A;  1030-rescheduled to 830 Ann notified pt   COLONOSCOPY W/ POLYPECTOMY  2006    Allergies  Allergies  Allergen Reactions   Uloric [Febuxostat] Rash    History of Present Illness    Dillon Strickland is a very pleasant 72 y.o. male with a PMH as mentioned above.  Last seen by Dr. Dina Rich on March 08, 2021.  Was overall doing well at the time.  Was being followed with [facility].  Today he presents for overdue 1 year follow-up.  He states he is doing well. Denies any chest pain, shortness of breath, palpitations, syncope, presyncope, dizziness, orthopnea, PND, swelling or significant weight changes, acute bleeding, or claudication.  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  EKGs/Labs/Other Studies Reviewed:   The following studies were reviewed today:   EKG:  EKG is ordered today.  The ekg ordered  today demonstrates A-fib, 63 bpm, no acute ischemic changes.  Recent Labs: No results found for requested labs within last 365 days.  Recent Lipid Panel    Component Value Date/Time   CHOL 163 01/05/2011 1942   TRIG 141 01/05/2011 1942   HDL 33 (L) 01/05/2011 1942   CHOLHDL 4.9 Ratio 01/05/2011 1942   VLDL 28 01/05/2011 1942   LDLCALC 102 (H) 01/05/2011 1942    Risk Assessment/Calculations:   CHA2DS2-VASc Score = 2  } This indicates a 2.2% annual risk of stroke. The patient's score is based upon: CHF History: 0 HTN History: 0 Diabetes History: 0 Stroke History: 0 Vascular Disease History: 1 Age Score: 1 Gender Score: 0   Home Medications   Current Meds  Medication Sig   allopurinol (ZYLOPRIM) 300 MG tablet Take 300 mg by mouth daily.   atenolol (TENORMIN) 50 MG tablet TAKE ONE TABLET BY MOUTH DAILY.   atorvastatin (LIPITOR) 20 MG tablet Take 20 mg by mouth daily.   diltiazem (CARTIA XT) 240 MG 24 hr capsule Take 1 capsule (240 mg total) by mouth daily.   lisinopril (ZESTRIL) 2.5 MG tablet Take 2.5 mg by mouth daily.   tamsulosin (FLOMAX) 0.4 MG CAPS capsule Take 1 capsule (0.4 mg total) by mouth daily.   warfarin (COUMADIN) 5 MG tablet TAKE 1/2 TABLET BY MOUTH DAILY OR AS DIRECTED BY COUMADIN CLINIC.     Review of Systems    All other  systems reviewed and are otherwise negative except as noted above.  Physical Exam    VS:  BP 106/60   Pulse 60   Ht 5\' 7"  (1.702 m)   Wt 245 lb 6.4 oz (111.3 kg)   SpO2 97%   BMI 38.44 kg/m  , BMI Body mass index is 38.44 kg/m.  Wt Readings from Last 3 Encounters:  04/22/23 245 lb 6.4 oz (111.3 kg)  01/29/23 245 lb (111.1 kg)  10/29/22 244 lb 6.4 oz (110.9 kg)     GEN: Obese, 72 y.o. male in no acute distress. HEENT: normal. Neck: Supple, no JVD, carotid bruits, or masses. Cardiac: S1/S2, irregular rhythm and regular rate, no murmurs, rubs, or gallops. No clubbing, cyanosis, edema.  Radials/PT 2+ and equal bilaterally.   Respiratory:  Respirations regular and unlabored, clear to auscultation bilaterally. MS: No deformity or atrophy. Skin: Warm and dry, no rash. Neuro:  Strength and sensation are intact. Psych: Normal affect.  Assessment & Plan    Chronic A-fib Denies any tachycardia or palpitations. HR well controlled today. Continue atenolol and diltiazem. Continue to follow-up at Coumadin clinic. Denies any bleeding issues.   HLD LDL 69 from last fall. Continue atorvastatin. Heart healthy diet and regular cardiovascular exercise encouraged.  Disposition: Follow up in 1 year(s) with Dina Rich, MD or APP.  Signed, Sharlene Dory, NP 04/22/2023, 5:14 PM [facility]

## 2023-05-23 ENCOUNTER — Ambulatory Visit: Payer: PPO | Attending: Cardiology | Admitting: *Deleted

## 2023-05-23 DIAGNOSIS — I482 Chronic atrial fibrillation, unspecified: Secondary | ICD-10-CM | POA: Diagnosis not present

## 2023-05-23 DIAGNOSIS — N1831 Chronic kidney disease, stage 3a: Secondary | ICD-10-CM | POA: Diagnosis not present

## 2023-05-23 LAB — POCT INR: INR: 3.9 — AB (ref 2.0–3.0)

## 2023-05-23 NOTE — Patient Instructions (Signed)
Hold warfarin tomorrow then resume 1/2 tablet daily  Continue greens/salads Recheck in 6 weeks per patient

## 2023-07-25 DIAGNOSIS — M79673 Pain in unspecified foot: Secondary | ICD-10-CM | POA: Diagnosis not present

## 2023-07-25 DIAGNOSIS — G5762 Lesion of plantar nerve, left lower limb: Secondary | ICD-10-CM | POA: Diagnosis not present

## 2023-07-25 DIAGNOSIS — G5761 Lesion of plantar nerve, right lower limb: Secondary | ICD-10-CM | POA: Diagnosis not present

## 2023-08-08 DIAGNOSIS — I480 Paroxysmal atrial fibrillation: Secondary | ICD-10-CM | POA: Diagnosis not present

## 2023-08-08 DIAGNOSIS — D6869 Other thrombophilia: Secondary | ICD-10-CM | POA: Diagnosis not present

## 2023-08-19 ENCOUNTER — Encounter: Payer: Self-pay | Admitting: Primary Care

## 2023-08-19 ENCOUNTER — Ambulatory Visit: Payer: PPO | Admitting: Primary Care

## 2023-08-19 VITALS — BP 120/80 | HR 97 | Ht 67.0 in | Wt 238.0 lb

## 2023-08-19 DIAGNOSIS — G4733 Obstructive sleep apnea (adult) (pediatric): Secondary | ICD-10-CM

## 2023-08-19 NOTE — Assessment & Plan Note (Addendum)
-   Severe OSA, AHI 65/hour. Patient is moderately compliant with CPAP use, recently inconsistent due to wife's health. He reports improvement in energy levels with CPAP use. AHI continues to improve, significantly better compared to previous readings.  Current pressure 13 cm H2O; residual AHI 8.3/hour.  No changes recommended today.  Encourage patient aim to wear CPAP nightly for 4 to 6 hours or longer. Continue weight loss efforts and side sleeping position.  Advised against driving if experiencing excessive daytime sleepiness fatigue.  Follow-up in 6 months or sooner if needed.

## 2023-08-19 NOTE — Patient Instructions (Addendum)
Great seeing you today Mr Dillon Strickland looks great, just continue to work on wearing CPAP every night 4-6 hours Sleep apnea looks well controlled on pressure settings AHI 8.3/hour.  No changes recommended.   Recommendations : Aim to wear cpap nightly 4-6 hours   Follow-up 6 months with Beth NP or sooner if needed

## 2023-08-19 NOTE — Progress Notes (Signed)
@Patient  ID: Dillon Strickland, male    DOB: 1951/06/25, 72 y.o.   MRN: [id number]  Chief Complaint  Patient presents with   Follow-up    Referring provider: Carylon Perches, MD  HPI: 72 year old male, never smoked.  Past medical history significant for chronic A-fib on anticoagulation, hyperlipidemia, gout.  Previous [facility] pulmonary encounter:  09/11/2022 Patient presents today for sleep consult. He has symptoms of snoring and significant daytime sleepiness. He reports being extremley tired during the day. He has not felt like doing anything the last year. His mood is not depressed. He had sleep study on 08/21/22 through [facility] that showed he had severe sleep apnea, AHI 65/hour with Spo2 low 72% (99%). Weight 240lbs. We reviewed treatment options. He is interested in starting CPAP. He plans on working on weight loss. He would like to use [employer]. His wife has parkinson's psychosis, she is a Therapist, sports for [employer]. Sleep apnea runs in his family. Denies symptoms of narcolepsy, cataplexy or sleepwalking.  Sleep questionnaire Symptoms-   Snoring  Prior sleep study- 08/21/22 >> severe OSA, AHI 65/hr  Bedtime- 10pm Time to fall asleep- 15 mins Nocturnal awakenings- 3-4 times Out of bed/start of day- 7am Weight changes- No Do you operate heavy machinery- No Do you currently wear CPAP- No Do you current wear oxygen- No Epworth- 7  10/29/2022 Patient presents today for follow-up sleep apnea. HST 08/21/22 showed severe OSA, AHI 65/hour. Started on CPAP in October 2023, he purchased his CPAP machine and was able to get reimbursed. He is feeling better. He reports more energy. He feels rested when he wakes up. He is still snoring. No difficulties falling back to sleep when he wakes up at night. No issues with mask fit or pressure settings. He is using hybrid full face mask. DME company is [employer].   Airview download 09/26/22-10/25/22 Usage 28/30  days; 93% > 4 hours Average usage 6 hours 24 mins Pressure 5-15cm h20 (13.5cm h20-95%) Airleaks 35.5L/min (95%)  AHI 16.1/hour  01/29/2023 Patient presents today for OSA follow-up. He is doing well. Reports significant benefit since starting CPAP. He no longer needs to take a nap during the day and feels more energized. He is experiencing some airleaks from his mask at night, he attributes this to his facial hair/beard. He uses [employer] dreamwear hybrid full face mask size large. He has not change mask or supplies since he received CPAP in October 2023. DME is [employer].   Airview download 12/29/22-01/27/23 Usage 25/30 days (83%); 19 days (63%) Usage days used 5 hours 16 mins Pressure 8-18cm h20 (12.9cm h20-95%) Airleaks 43.9L/min (95%) AHI 14.2    08/19/2023 - Interim hx  Patient presents for OSA follow-up He feels a lot better since last visit. Energy levels are better. He saw foot doctor. He got new sneakers. Leg pain has improved.He is working on weight loss. Wants to get back outside to do some lumbar work. CPAP use has been inconsistent.His wife had a stroke on May 5th. She then had RSV. She had bed sores. He cared for her and at night he has had to sit down stairs with her. He also had covid in August, he has no residual symptoms. He go a new CPAP mask, he is having some air leaks. AHI continues to improve, significant better compared to previous readings.   Airview download 07/17/23-08/15/23 Usage days 23/30 days (77%); 15 days (50%) greater than 4 hours Average usage days used 4 hours 57 minutes  Pressure 13 cm H2O Air leaks 33.8 L/min (95%) AHI 8.3  Allergies  Allergen Reactions   Uloric [Febuxostat] Rash    Immunization History  Administered Date(s) Administered   Influenza Split 09/25/2016    Past Medical History:  Diagnosis Date   Chronic anticoagulation    Colonoscopy with polypectomy in 2006; due for a repeat procedure as of 2012   Chronic atrial fibrillation  (HCC) 2001   Spontaneous contrast on TEE   Degenerative joint disease of spine    Mild; cervical and lumbosacral   Gout    Hyperlipidemia    Overweight(278.02)    Pseudogout     Tobacco History: Social History   Tobacco Use  Smoking Status Never   Passive exposure: Past  Smokeless Tobacco Never   Counseling given: Not Answered   Outpatient Medications Prior to Visit  Medication Sig Dispense Refill   allopurinol (ZYLOPRIM) 300 MG tablet Take 300 mg by mouth daily.     atenolol (TENORMIN) 50 MG tablet TAKE ONE TABLET BY MOUTH DAILY. 90 tablet 1   atorvastatin (LIPITOR) 20 MG tablet Take 20 mg by mouth daily.     diltiazem (CARTIA XT) 240 MG 24 hr capsule Take 1 capsule (240 mg total) by mouth daily. 30 capsule 1   lisinopril (ZESTRIL) 2.5 MG tablet Take 2.5 mg by mouth daily.     warfarin (COUMADIN) 5 MG tablet TAKE 1/2 TABLET BY MOUTH DAILY OR AS DIRECTED BY COUMADIN CLINIC. 45 tablet 1   tamsulosin (FLOMAX) 0.4 MG CAPS capsule Take 1 capsule (0.4 mg total) by mouth daily. 90 capsule 3   No facility-administered medications prior to visit.    Review of Systems  Review of Systems  Constitutional: Negative.  Negative for fatigue.  HENT: Negative.    Respiratory: Negative.    Cardiovascular: Negative.     Physical Exam  BP 120/80 (BP Location: Left Arm, Cuff Size: Large)   Pulse 97   Ht 5\' 7"  (1.702 m)   Wt 238 lb (108 kg)   SpO2 97%   BMI 37.28 kg/m  Physical Exam Constitutional:      Appearance: Normal appearance.  HENT:     Head: Normocephalic and atraumatic.  Cardiovascular:     Rate and Rhythm: Normal rate and regular rhythm.  Pulmonary:     Effort: Pulmonary effort is normal.     Breath sounds: Normal breath sounds.  Musculoskeletal:        General: Normal range of motion.  Skin:    General: Skin is warm and dry.  Neurological:     General: No focal deficit present.     Mental Status: He is alert and oriented to person, place, and time. Mental  status is at baseline.  Psychiatric:        Mood and Affect: Mood normal.        Behavior: Behavior normal.        Thought Content: Thought content normal.        Judgment: Judgment normal.      Lab Results:  CBC    Component Value Date/Time   WBC 13.2 (H) 12/02/2021 0452   RBC 3.19 (L) 12/02/2021 0452   HGB 9.8 (L) 12/02/2021 0452   HCT 29.8 (L) 12/02/2021 0452   PLT 198 12/02/2021 0452   MCV 93.4 12/02/2021 0452   MCH 30.7 12/02/2021 0452   MCHC 32.9 12/02/2021 0452   RDW 15.0 12/02/2021 0452   LYMPHSABS 0.3 (L) 11/28/2014 2314   MONOABS 0.3  11/28/2014 2314   EOSABS 0.0 11/28/2014 2314   BASOSABS 0.0 11/28/2014 2314    BMET    Component Value Date/Time   NA 136 12/02/2021 0452   K 4.3 12/02/2021 0452   CL 105 12/02/2021 0452   CO2 25 12/02/2021 0452   GLUCOSE 111 (H) 12/02/2021 0452   BUN 43 (H) 12/02/2021 0452   CREATININE 1.71 (H) 12/02/2021 0452   CREATININE 0.92 02/05/2012 1419   CALCIUM 8.2 (L) 12/02/2021 0452   GFRNONAA 43 (L) 12/02/2021 0452   GFRAA 38 (L) 11/28/2014 2314    BNP No results found for: "BNP"  ProBNP No results found for: "PROBNP"  Imaging: No results found.   Assessment & Plan:   OSA (obstructive sleep apnea) - Severe OSA, AHI 65/hour. Patient is moderately compliant with CPAP use, recently inconsistent due to wife's health. He reports improvement in energy levels with CPAP use. AHI continues to improve, significantly better compared to previous readings.  Current pressure 13 cm H2O; residual AHI 8.3/hour.  No changes recommended today.  Encourage patient aim to wear CPAP nightly for 4 to 6 hours or longer. Continue weight loss efforts and side sleeping position.  Advised against driving if experiencing excessive daytime sleepiness fatigue.  Follow-up in 6 months or sooner if needed.   Glenford Bayley, NP 08/19/2023

## 2023-08-20 ENCOUNTER — Ambulatory Visit: Payer: PPO | Attending: Cardiology | Admitting: *Deleted

## 2023-08-20 DIAGNOSIS — N1831 Chronic kidney disease, stage 3a: Secondary | ICD-10-CM

## 2023-08-20 DIAGNOSIS — I482 Chronic atrial fibrillation, unspecified: Secondary | ICD-10-CM

## 2023-08-20 LAB — POCT INR: INR: 2.7 (ref 2.0–3.0)

## 2023-08-20 NOTE — Patient Instructions (Signed)
Continue warfarin 1/2 tablet daily  Continue greens/salads Recheck in 6 weeks per patient

## 2023-08-20 NOTE — Progress Notes (Signed)
Reviewed and agree with assessment/plan.   Coralyn Helling, MD [facility] 08/20/2023, 5:51 PM Pager:  [phone]

## 2023-10-01 ENCOUNTER — Ambulatory Visit: Payer: PPO | Attending: Cardiology | Admitting: *Deleted

## 2023-10-01 DIAGNOSIS — Z5181 Encounter for therapeutic drug level monitoring: Secondary | ICD-10-CM

## 2023-10-01 DIAGNOSIS — N1831 Chronic kidney disease, stage 3a: Secondary | ICD-10-CM

## 2023-10-01 DIAGNOSIS — I482 Chronic atrial fibrillation, unspecified: Secondary | ICD-10-CM

## 2023-10-01 LAB — POCT INR: INR: 1.9 — AB (ref 2.0–3.0)

## 2023-10-01 MED ORDER — WARFARIN SODIUM 5 MG PO TABS: ORAL_TABLET | ORAL | 0 refills | Status: DC

## 2023-10-01 NOTE — Patient Instructions (Signed)
Take warfarin extra 1/2 tablet today then resume 1/2 tablet daily  Continue greens/salads Recheck in 6 weeks per patient

## 2023-10-03 DIAGNOSIS — N1831 Chronic kidney disease, stage 3a: Secondary | ICD-10-CM | POA: Diagnosis not present

## 2023-10-03 DIAGNOSIS — I4821 Permanent atrial fibrillation: Secondary | ICD-10-CM | POA: Diagnosis not present

## 2023-10-03 DIAGNOSIS — R197 Diarrhea, unspecified: Secondary | ICD-10-CM | POA: Diagnosis not present

## 2023-10-03 DIAGNOSIS — M1000 Idiopathic gout, unspecified site: Secondary | ICD-10-CM | POA: Diagnosis not present

## 2023-10-10 DIAGNOSIS — Z23 Encounter for immunization: Secondary | ICD-10-CM | POA: Diagnosis not present

## 2023-10-15 ENCOUNTER — Other Ambulatory Visit: Payer: Self-pay | Admitting: Cardiology

## 2023-10-15 DIAGNOSIS — I482 Chronic atrial fibrillation, unspecified: Secondary | ICD-10-CM

## 2023-10-15 DIAGNOSIS — Z5181 Encounter for therapeutic drug level monitoring: Secondary | ICD-10-CM

## 2023-10-16 NOTE — Telephone Encounter (Signed)
Refill request for warfarin:  Last INR was 1.9 on 10/01/23 Next INR due 11/12/23 LOV was 05/23/23  Refill approved.

## 2023-11-12 ENCOUNTER — Ambulatory Visit: Payer: PPO | Attending: Cardiology | Admitting: *Deleted

## 2023-11-12 DIAGNOSIS — N1831 Chronic kidney disease, stage 3a: Secondary | ICD-10-CM | POA: Diagnosis not present

## 2023-11-12 DIAGNOSIS — I482 Chronic atrial fibrillation, unspecified: Secondary | ICD-10-CM | POA: Diagnosis not present

## 2023-11-12 LAB — POCT INR: INR: 3 (ref 2.0–3.0)

## 2023-11-12 NOTE — Patient Instructions (Signed)
Continue warfarin 1/2 tablet daily  Continue greens/salads Recheck in 6 weeks per patient

## 2023-11-19 DIAGNOSIS — M109 Gout, unspecified: Secondary | ICD-10-CM | POA: Diagnosis not present

## 2023-11-19 DIAGNOSIS — I4821 Permanent atrial fibrillation: Secondary | ICD-10-CM | POA: Diagnosis not present

## 2023-12-16 DIAGNOSIS — R361 Hematospermia: Secondary | ICD-10-CM | POA: Diagnosis not present

## 2023-12-16 DIAGNOSIS — R300 Dysuria: Secondary | ICD-10-CM | POA: Diagnosis not present

## 2023-12-16 DIAGNOSIS — N3000 Acute cystitis without hematuria: Secondary | ICD-10-CM | POA: Diagnosis not present

## 2023-12-24 ENCOUNTER — Telehealth: Payer: Self-pay

## 2023-12-24 ENCOUNTER — Ambulatory Visit: Payer: PPO | Attending: Cardiology | Admitting: *Deleted

## 2023-12-24 DIAGNOSIS — I482 Chronic atrial fibrillation, unspecified: Secondary | ICD-10-CM | POA: Diagnosis not present

## 2023-12-24 DIAGNOSIS — N1831 Chronic kidney disease, stage 3a: Secondary | ICD-10-CM | POA: Diagnosis not present

## 2023-12-24 LAB — POCT INR: INR: 2.6 (ref 2.0–3.0)

## 2023-12-24 NOTE — Telephone Encounter (Signed)
 A voice message was left 12-24-2023.  Toniann Fail from Dr. Ilean Skill office calling to check on status of patient referral.  Call Toniann Fail:  [phone]

## 2023-12-24 NOTE — Patient Instructions (Signed)
Continue warfarin 1/2 tablet daily  Continue greens/salads Recheck in 6 weeks per patient

## 2024-01-14 ENCOUNTER — Other Ambulatory Visit: Payer: Self-pay | Admitting: Cardiology

## 2024-02-03 ENCOUNTER — Other Ambulatory Visit: Payer: Self-pay | Admitting: Internal Medicine

## 2024-02-03 DIAGNOSIS — I482 Chronic atrial fibrillation, unspecified: Secondary | ICD-10-CM

## 2024-02-03 DIAGNOSIS — Z5181 Encounter for therapeutic drug level monitoring: Secondary | ICD-10-CM

## 2024-02-03 NOTE — Telephone Encounter (Signed)
 Refill request for warfarin:  Last INR was 2.6 on 12/24/23 Next INR due 02/04/24 LOV was 11/12/23   Refill approved.

## 2024-02-04 ENCOUNTER — Ambulatory Visit: Payer: PPO | Attending: Cardiology | Admitting: *Deleted

## 2024-02-04 DIAGNOSIS — I482 Chronic atrial fibrillation, unspecified: Secondary | ICD-10-CM | POA: Diagnosis not present

## 2024-02-04 DIAGNOSIS — N1831 Chronic kidney disease, stage 3a: Secondary | ICD-10-CM | POA: Diagnosis not present

## 2024-02-04 LAB — POCT INR: INR: 2.4 (ref 2.0–3.0)

## 2024-02-04 NOTE — Patient Instructions (Signed)
Continue warfarin 1/2 tablet daily  Continue greens/salads Recheck in 6 weeks per patient

## 2024-02-06 ENCOUNTER — Ambulatory Visit: Payer: PPO | Admitting: Urology

## 2024-02-07 ENCOUNTER — Ambulatory Visit: Payer: PPO | Admitting: Urology

## 2024-02-17 ENCOUNTER — Ambulatory Visit: Payer: PPO | Admitting: Primary Care

## 2024-02-19 DIAGNOSIS — R319 Hematuria, unspecified: Secondary | ICD-10-CM | POA: Diagnosis not present

## 2024-02-19 DIAGNOSIS — N189 Chronic kidney disease, unspecified: Secondary | ICD-10-CM | POA: Diagnosis not present

## 2024-02-19 DIAGNOSIS — I7782 Antineutrophilic cytoplasmic antibody (ANCA) vasculitis: Secondary | ICD-10-CM | POA: Diagnosis not present

## 2024-02-19 DIAGNOSIS — R809 Proteinuria, unspecified: Secondary | ICD-10-CM | POA: Diagnosis not present

## 2024-02-19 DIAGNOSIS — E211 Secondary hyperparathyroidism, not elsewhere classified: Secondary | ICD-10-CM | POA: Diagnosis not present

## 2024-02-19 DIAGNOSIS — D631 Anemia in chronic kidney disease: Secondary | ICD-10-CM | POA: Diagnosis not present

## 2024-02-24 DIAGNOSIS — N013 Rapidly progressive nephritic syndrome with diffuse mesangial proliferative glomerulonephritis: Secondary | ICD-10-CM | POA: Diagnosis not present

## 2024-02-24 DIAGNOSIS — G4733 Obstructive sleep apnea (adult) (pediatric): Secondary | ICD-10-CM | POA: Diagnosis not present

## 2024-02-24 DIAGNOSIS — R809 Proteinuria, unspecified: Secondary | ICD-10-CM | POA: Diagnosis not present

## 2024-02-24 DIAGNOSIS — I7782 Antineutrophilic cytoplasmic antibody (ANCA) vasculitis: Secondary | ICD-10-CM | POA: Diagnosis not present

## 2024-03-17 ENCOUNTER — Ambulatory Visit: Payer: PPO

## 2024-03-20 ENCOUNTER — Ambulatory Visit: Admitting: Primary Care

## 2024-03-24 ENCOUNTER — Ambulatory Visit: Attending: Cardiology | Admitting: *Deleted

## 2024-03-24 DIAGNOSIS — N1831 Chronic kidney disease, stage 3a: Secondary | ICD-10-CM

## 2024-03-24 DIAGNOSIS — I482 Chronic atrial fibrillation, unspecified: Secondary | ICD-10-CM | POA: Diagnosis not present

## 2024-03-24 LAB — POCT INR: INR: 2.7 (ref 2.0–3.0)

## 2024-03-24 NOTE — Patient Instructions (Signed)
Continue warfarin 1/2 tablet daily  Continue greens/salads Recheck in 6 weeks per patient

## 2024-04-20 ENCOUNTER — Ambulatory Visit (HOSPITAL_COMMUNITY)
Admission: RE | Admit: 2024-04-20 | Discharge: 2024-04-20 | Disposition: A | Discharge status: Home / Self Care | Source: Ambulatory Visit | Attending: Internal Medicine | Admitting: Internal Medicine

## 2024-04-20 ENCOUNTER — Other Ambulatory Visit (HOSPITAL_COMMUNITY): Payer: Self-pay | Admitting: Internal Medicine

## 2024-04-20 DIAGNOSIS — M542 Cervicalgia: Secondary | ICD-10-CM | POA: Diagnosis not present

## 2024-04-20 DIAGNOSIS — M47812 Spondylosis without myelopathy or radiculopathy, cervical region: Secondary | ICD-10-CM | POA: Diagnosis not present

## 2024-04-20 DIAGNOSIS — Z9181 History of falling: Secondary | ICD-10-CM | POA: Diagnosis not present

## 2024-04-20 DIAGNOSIS — S59902D Unspecified injury of left elbow, subsequent encounter: Secondary | ICD-10-CM | POA: Diagnosis not present

## 2024-04-20 DIAGNOSIS — M19022 Primary osteoarthritis, left elbow: Secondary | ICD-10-CM | POA: Diagnosis not present

## 2024-04-20 DIAGNOSIS — R0781 Pleurodynia: Secondary | ICD-10-CM | POA: Diagnosis not present

## 2024-04-21 ENCOUNTER — Ambulatory Visit: Payer: PPO | Attending: Nurse Practitioner | Admitting: Nurse Practitioner

## 2024-04-21 ENCOUNTER — Encounter: Payer: Self-pay | Admitting: Nurse Practitioner

## 2024-04-21 VITALS — BP 118/70 | HR 70 | Ht 67.0 in | Wt 236.8 lb

## 2024-04-21 DIAGNOSIS — E785 Hyperlipidemia, unspecified: Secondary | ICD-10-CM

## 2024-04-21 DIAGNOSIS — I482 Chronic atrial fibrillation, unspecified: Secondary | ICD-10-CM | POA: Diagnosis not present

## 2024-04-21 NOTE — Progress Notes (Unsigned)
 Office Visit    Patient Name: Dillon Strickland Date of Encounter: 04/21/2024 PCP:  Artemisa Bile, MD [facility]  Cardiologist:  Armida Lander, MD  Advanced Practice Provider:  No care team member to display Electrophysiologist:  None   Chief Complaint and HPI    Dillon Strickland is a 73 y.o. male with a hx of chronic A-fib, hyperlipidemia, history of gout/pseudogout, vasculitis, who presents today for 1 year follow-up.  Last seen by Dr. Armida Lander on March 08, 2021.  Was overall doing well at the time.  Was being followed with [facility] rheumatology.  I last saw patient on Apr 24, 2023.  He was doing well at that time.    Today he presents for 1 year follow-up with his wife. Doing well from a cardiac standpoint. Had a mechanical fall around 2 weeks ago while trying to kill a stink bug. Hurt his left shoulder and says it is difficult for him to raise his shoulder.  Had recent x-rays that were negative for any fracture. Denies any chest pain, shortness of breath, palpitations, syncope, presyncope, dizziness, orthopnea, PND, swelling or significant weight changes, acute bleeding, or claudication.  Dillon Strickland often with his wife to provide awareness of Parkinson's psychosis. Has traveled to [place] and looking to be traveling to [facility] in the future.   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  EKGs/Labs/Other Studies Reviewed:   The following studies were reviewed today:   EKG: EKG Interpretation Date/Time:  Tuesday Apr 21 2024 09:26:00 EDT Ventricular Rate:  70 PR Interval:    QRS Duration:  90 QT Interval:  394 QTC Calculation: 425 R Axis:   103  Text Interpretation: Atrial fibrillation Rightward axis When compared with ECG of 28-Nov-2014 23:17, PREVIOUS ECG IS PRESENT Confirmed by Lasalle Pointer [phone]) on 04/21/2024 9:30:07 AM   Risk Assessment/Calculations:   CHA2DS2-VASc Score = 2  } This indicates a  2.2% annual risk of stroke. The patient's score is based upon: CHF History: 0 HTN History: 0 Diabetes History: 0 Stroke History: 0 Vascular Disease History: 1 Age Score: 1 Gender Score: 0   Review of Systems    All other systems reviewed and are otherwise negative except as noted above.  Physical Exam    VS:  BP 118/70   Pulse 70   Ht 5\' 7"  (1.702 m)   Wt 236 lb 12.8 oz (107.4 kg)   SpO2 98%   BMI 37.09 kg/m  , BMI Body mass index is 37.09 kg/m.  Wt Readings from Last 3 Encounters:  04/21/24 236 lb 12.8 oz (107.4 kg)  08/19/23 238 lb (108 kg)  04/22/23 245 lb 6.4 oz (111.3 kg)     GEN: Obese, 73 y.o. male in no acute distress. HEENT: normal. Neck: Supple, no JVD, carotid bruits, or masses. Cardiac: S1/S2, irregular rhythm and regular rate, no murmurs, rubs, or gallops. No clubbing, cyanosis, edema.  Radials/PT 2+ and equal bilaterally.  Respiratory:  Respirations regular and unlabored, clear to auscultation bilaterally. MS: No deformity or atrophy. Skin: Warm and dry, no rash. Neuro:  Strength and sensation are intact. Psych: Normal affect.  Assessment & Plan    Chronic A-fib Denies any tachycardia or palpitations. HR well controlled today. Continue atenolol  and diltiazem . Continue to follow-up at Coumadin  clinic. Denies any bleeding issues while on Coumadin .   HLD No recent lipid panel on file. Will request most recent labs from PCP as well  as including most recent imaging per his report. Continue atorvastatin . Heart healthy diet and regular cardiovascular exercise encouraged.  3. Obesity Weight loss via diet and exercise encouraged. Discussed the impact being overweight would have on cardiovascular risk.  Disposition: Follow up in 1 year(s) with Armida Lander, MD or APP.  Signed, Lasalle Pointer, NP

## 2024-04-21 NOTE — Patient Instructions (Signed)

## 2024-04-23 DIAGNOSIS — R361 Hematospermia: Secondary | ICD-10-CM | POA: Diagnosis not present

## 2024-04-23 DIAGNOSIS — R319 Hematuria, unspecified: Secondary | ICD-10-CM | POA: Diagnosis not present

## 2024-04-23 DIAGNOSIS — N411 Chronic prostatitis: Secondary | ICD-10-CM | POA: Diagnosis not present

## 2024-05-11 ENCOUNTER — Encounter: Payer: Self-pay | Admitting: Primary Care

## 2024-05-11 ENCOUNTER — Ambulatory Visit: Admitting: Primary Care

## 2024-05-11 NOTE — Progress Notes (Deleted)
 @Patient  ID: Dillon Strickland, male    DOB: 04-14-1951, 73 y.o.   MRN: [id number]  No chief complaint on file.   Referring provider: Artemisa Bile, MD  HPI: 73 year old male, never smoked.  Past medical history significant for chronic A-fib on anticoagulation, hyperlipidemia, gout.     05/11/2024     Allergies  Allergen Reactions   Uloric [Febuxostat] Rash    Immunization History  Administered Date(s) Administered   Influenza Split 09/25/2016    Past Medical History:  Diagnosis Date   Chronic anticoagulation    Colonoscopy with polypectomy in 2006; due for a repeat procedure as of 2012   Chronic atrial fibrillation (HCC) 2001   Spontaneous contrast on TEE   Degenerative joint disease of spine    Mild; cervical and lumbosacral   Gout    Hyperlipidemia    Overweight(278.02)    Pseudogout     Tobacco History: Social History   Tobacco Use  Smoking Status Never   Passive exposure: Past  Smokeless Tobacco Never   Counseling given: Not Answered   Outpatient Medications Prior to Visit  Medication Sig Dispense Refill   allopurinol  (ZYLOPRIM ) 300 MG tablet Take 300 mg by mouth daily.     atenolol  (TENORMIN ) 50 MG tablet TAKE ONE TABLET BY MOUTH DAILY. 90 tablet 3   atorvastatin  (LIPITOR) 20 MG tablet Take 20 mg by mouth daily.     diltiazem  (CARTIA  XT) 240 MG 24 hr capsule Take 1 capsule (240 mg total) by mouth daily. 30 capsule 1   levothyroxine (SYNTHROID) 50 MCG tablet Take 50 mcg by mouth every morning.     lisinopril (ZESTRIL) 2.5 MG tablet Take 2.5 mg by mouth daily.     warfarin (COUMADIN ) 5 MG tablet TAKE 1/2 TABLET BY MOUTH DAILY OR AS DIRECTED BY COUMADIN  CLINIC. 30 tablet 3   No facility-administered medications prior to visit.      Review of Systems  Review of Systems   Physical Exam  There were no vitals taken for this visit. Physical Exam   Lab Results:  CBC    Component Value Date/Time   WBC 13.2 (H) 12/02/2021 0452   RBC 3.19 (L)  12/02/2021 0452   HGB 9.8 (L) 12/02/2021 0452   HCT 29.8 (L) 12/02/2021 0452   PLT 198 12/02/2021 0452   MCV 93.4 12/02/2021 0452   MCH 30.7 12/02/2021 0452   MCHC 32.9 12/02/2021 0452   RDW 15.0 12/02/2021 0452   LYMPHSABS 0.3 (L) 11/28/2014 2314   MONOABS 0.3 11/28/2014 2314   EOSABS 0.0 11/28/2014 2314   BASOSABS 0.0 11/28/2014 2314    BMET    Component Value Date/Time   NA 136 12/02/2021 0452   K 4.3 12/02/2021 0452   CL 105 12/02/2021 0452   CO2 25 12/02/2021 0452   GLUCOSE 111 (H) 12/02/2021 0452   BUN 43 (H) 12/02/2021 0452   CREATININE 1.71 (H) 12/02/2021 0452   CREATININE 0.92 02/05/2012 1419   CALCIUM  8.2 (L) 12/02/2021 0452   GFRNONAA 43 (L) 12/02/2021 0452   GFRAA 38 (L) 11/28/2014 2314    BNP No results found for: "BNP"  ProBNP No results found for: "PROBNP"  Imaging: DG Ribs Unilateral W/Chest Left Result Date: 04/20/2024 CLINICAL DATA:  Fall 2 weeks ago.  Left sided pain. EXAM: LEFT RIBS AND CHEST - 3+ VIEW COMPARISON:  Chest two views 11/30/2021 FINDINGS: Cardiac silhouette and mediastinal contours are unchanged and within normal limits. Minimal left basilar curvilinear subsegmental atelectasis versus scarring.  Resolution of the prior small left pleural effusion with improvement in left lower lung aeration. No pneumothorax. No acute displaced left-sided rib fracture is seen. Moderate anterior L5-S1 endplate osteophytes. IMPRESSION: 1. No acute displaced left-sided rib fracture is seen. 2. Resolution of the prior small left pleural effusion with improvement in left lower lung aeration. 3. No pneumothorax. Electronically Signed   By: Bertina Broccoli M.D.   On: 04/20/2024 16:57   DG ELBOW COMPLETE LEFT (3+VIEW) Result Date: 04/20/2024 CLINICAL DATA:  Fall 2 weeks ago.  Fell in bathroom on left side. EXAM: LEFT ELBOW - COMPLETE 3+ VIEW COMPARISON:  None Available. FINDINGS: Minimal chronic enthesopathic change at the triceps insertion on the olecranon. Normal  position of the distal humeral fat pad without evidence of elbow joint effusion. Mild to moderate peripheral medial coronoid process degenerative spurring at the medial elbow. Mild chondrocalcinosis. No acute fracture or dislocation. Moderate atherosclerotic calcifications. IMPRESSION: 1. No acute fracture. 2. Mild to moderate medial elbow osteoarthritis. Electronically Signed   By: Bertina Broccoli M.D.   On: 04/20/2024 16:54   DG Cervical Spine Complete Result Date: 04/20/2024 CLINICAL DATA:  Status post fall 2 weeks ago with neck pain. EXAM: CERVICAL SPINE - COMPLETE 4+ VIEW COMPARISON:  None Available. FINDINGS: There is no evidence of cervical spine fracture or prevertebral soft tissue swelling. Alignment is normal. Mild narrow intervertebral space at C3-4, C5-6 and probably C6-7. No other significant bone abnormalities are identified. IMPRESSION: No acute fracture or dislocation. Mild degenerative joint changes of cervical spine. Electronically Signed   By: Anna Barnes M.D.   On: 04/20/2024 12:24     Assessment & Plan:   No problem-specific Assessment & Plan notes found for this encounter.     Antonio Baumgarten, NP 05/11/2024

## 2024-05-19 ENCOUNTER — Ambulatory Visit: Attending: Cardiology | Admitting: *Deleted

## 2024-05-19 DIAGNOSIS — I482 Chronic atrial fibrillation, unspecified: Secondary | ICD-10-CM | POA: Diagnosis not present

## 2024-05-19 DIAGNOSIS — N1831 Chronic kidney disease, stage 3a: Secondary | ICD-10-CM | POA: Diagnosis not present

## 2024-05-19 LAB — POCT INR: INR: 2.6 (ref 2.0–3.0)

## 2024-05-19 NOTE — Patient Instructions (Signed)
 Continue warfarin 1/2 tablet daily  Continue greens/salads Recheck in 8 weeks per patient

## 2024-05-25 DIAGNOSIS — N1831 Chronic kidney disease, stage 3a: Secondary | ICD-10-CM | POA: Diagnosis not present

## 2024-05-25 DIAGNOSIS — R809 Proteinuria, unspecified: Secondary | ICD-10-CM | POA: Diagnosis not present

## 2024-05-25 DIAGNOSIS — G4733 Obstructive sleep apnea (adult) (pediatric): Secondary | ICD-10-CM | POA: Diagnosis not present

## 2024-05-25 DIAGNOSIS — N013 Rapidly progressive nephritic syndrome with diffuse mesangial proliferative glomerulonephritis: Secondary | ICD-10-CM | POA: Diagnosis not present

## 2024-05-28 DIAGNOSIS — E211 Secondary hyperparathyroidism, not elsewhere classified: Secondary | ICD-10-CM | POA: Diagnosis not present

## 2024-05-28 DIAGNOSIS — N189 Chronic kidney disease, unspecified: Secondary | ICD-10-CM | POA: Diagnosis not present

## 2024-05-28 DIAGNOSIS — R809 Proteinuria, unspecified: Secondary | ICD-10-CM | POA: Diagnosis not present

## 2024-05-28 DIAGNOSIS — R319 Hematuria, unspecified: Secondary | ICD-10-CM | POA: Diagnosis not present

## 2024-05-28 DIAGNOSIS — I7782 Antineutrophilic cytoplasmic antibody (ANCA) vasculitis: Secondary | ICD-10-CM | POA: Diagnosis not present

## 2024-06-19 DIAGNOSIS — E039 Hypothyroidism, unspecified: Secondary | ICD-10-CM | POA: Diagnosis not present

## 2024-06-23 DIAGNOSIS — G4733 Obstructive sleep apnea (adult) (pediatric): Secondary | ICD-10-CM | POA: Diagnosis not present

## 2024-06-23 DIAGNOSIS — I130 Hypertensive heart and chronic kidney disease with heart failure and stage 1 through stage 4 chronic kidney disease, or unspecified chronic kidney disease: Secondary | ICD-10-CM | POA: Diagnosis not present

## 2024-06-23 DIAGNOSIS — E785 Hyperlipidemia, unspecified: Secondary | ICD-10-CM | POA: Diagnosis not present

## 2024-06-23 DIAGNOSIS — H259 Unspecified age-related cataract: Secondary | ICD-10-CM | POA: Diagnosis not present

## 2024-06-23 DIAGNOSIS — D6869 Other thrombophilia: Secondary | ICD-10-CM | POA: Diagnosis not present

## 2024-06-23 DIAGNOSIS — Z7901 Long term (current) use of anticoagulants: Secondary | ICD-10-CM | POA: Diagnosis not present

## 2024-06-23 DIAGNOSIS — M109 Gout, unspecified: Secondary | ICD-10-CM | POA: Diagnosis not present

## 2024-06-23 DIAGNOSIS — I509 Heart failure, unspecified: Secondary | ICD-10-CM | POA: Diagnosis not present

## 2024-06-23 DIAGNOSIS — I4891 Unspecified atrial fibrillation: Secondary | ICD-10-CM | POA: Diagnosis not present

## 2024-06-23 DIAGNOSIS — R32 Unspecified urinary incontinence: Secondary | ICD-10-CM | POA: Diagnosis not present

## 2024-06-23 DIAGNOSIS — N1832 Chronic kidney disease, stage 3b: Secondary | ICD-10-CM | POA: Diagnosis not present

## 2024-07-02 DIAGNOSIS — M1000 Idiopathic gout, unspecified site: Secondary | ICD-10-CM | POA: Diagnosis not present

## 2024-07-02 DIAGNOSIS — E039 Hypothyroidism, unspecified: Secondary | ICD-10-CM | POA: Diagnosis not present

## 2024-07-14 ENCOUNTER — Ambulatory Visit: Attending: Cardiology | Admitting: *Deleted

## 2024-07-14 DIAGNOSIS — N1831 Chronic kidney disease, stage 3a: Secondary | ICD-10-CM

## 2024-07-14 DIAGNOSIS — I482 Chronic atrial fibrillation, unspecified: Secondary | ICD-10-CM

## 2024-07-14 LAB — POCT INR: INR: 2.7 (ref 2.0–3.0)

## 2024-07-14 NOTE — Patient Instructions (Signed)
 Continue warfarin 1/2 tablet daily  Continue greens/salads Recheck in 8 weeks per patient

## 2024-07-14 NOTE — Progress Notes (Signed)
 INR 2.7  Please see anticoagulation encounter

## 2024-08-10 NOTE — Progress Notes (Signed)
 08/11/2024 7:21 PM   Dillon Strickland 1950-12-12 [id number]  Referring provider: Sheryle Carwin, MD [address] [place],  [place] [address]  No chief complaint on file.   HPI: 73 yo male seen in 2018 w/ microscopic hematuria for many years. He also has h/o cytoxan exposure. In 2018 cysto was neg except for urethral stx which was dilated. Cytology negative.  He has had 2-3 episodes of hematospermia in March of this year--has since resolved. At that time he had no LUTS or blood in urine.  Currently no LUTS. Or hematuria.  At his last visit 7 years ago it was recommended that he come back on an annual basis for recheck.  This is his first repeat visit.    PMH: Past Medical History:  Diagnosis Date   Chronic anticoagulation    Colonoscopy with polypectomy in 2006; due for a repeat procedure as of 2012   Chronic atrial fibrillation (HCC) 2001   Spontaneous contrast on TEE   Degenerative joint disease of spine    Mild; cervical and lumbosacral   Gout    Hyperlipidemia    Overweight(278.02)    Pseudogout     Surgical History: Past Surgical History:  Procedure Laterality Date   COLONOSCOPY N/A 04/23/2013   Procedure: COLONOSCOPY;  Surgeon: Dillon RAYMOND Rivet, MD;  Location: [facility];  Service: Endoscopy;  Laterality: N/A;  1030-rescheduled to 830 Ann notified pt   COLONOSCOPY W/ POLYPECTOMY  2006    Home Medications:  Allergies as of 08/11/2024       Reactions   Uloric [febuxostat] Rash        Medication List        Accurate as of August 10, 2024  7:21 PM. If you have any questions, ask your nurse or doctor.          allopurinol  300 MG tablet Commonly known as: ZYLOPRIM  Take 300 mg by mouth daily.   atenolol  50 MG tablet Commonly known as: TENORMIN  TAKE ONE TABLET BY MOUTH DAILY.   atorvastatin  20 MG tablet Commonly known as: LIPITOR Take 20 mg by mouth daily.   diltiazem  240 MG 24 hr capsule Commonly known as: Cartia  XT Take 1 capsule (240  mg total) by mouth daily.   levothyroxine 50 MCG tablet Commonly known as: SYNTHROID Take 50 mcg by mouth every morning.   lisinopril 2.5 MG tablet Commonly known as: ZESTRIL Take 2.5 mg by mouth daily.   warfarin 5 MG tablet Commonly known as: COUMADIN  Take as directed by the anticoagulation clinic. If you are unsure how to take this medication, talk to your nurse or doctor. Original instructions: TAKE 1/2 TABLET BY MOUTH DAILY OR AS DIRECTED BY COUMADIN  CLINIC.        Allergies:  Allergies  Allergen Reactions   Uloric [Febuxostat] Rash    Family History: Family History  Problem Relation Age of Onset   Stroke Mother    Cancer Mother    Heart attack Father    Arrhythmia Sister        Atrial fibrillation   Arrhythmia Brother    Arrhythmia Brother    Diabetes Other    Heart attack Other    Colon cancer Neg Hx     Social History:  reports that he has never smoked. He has been exposed to tobacco smoke. He has never used smokeless tobacco. He reports that he does not drink alcohol and does not use drugs.  ROS: All other review of systems were reviewed  and are negative except what is noted above in HPI  Physical Exam: There were no vitals taken for this visit.  Constitutional:  Alert and oriented, No acute distress. HEENT: [place] AT, moist mucus membranes.  Trachea midline, no masses. Cardiovascular: No clubbing, cyanosis, or edema. Respiratory: Normal respiratory effort, no increased work of breathing. GI: No inguinal hernias GU: Normal phallus. No masses/lesions on penis, testis, scrotum. Prostate 40g smooth no nodules no induration. Mild meatal stenosis. Lymph: No cervical or inguinal lymphadenopathy. Skin: No rashes, bruises or suspicious lesions. Neurologic: Grossly intact, no focal deficits, moving all 4 extremities. Psychiatric: Normal mood and affect.  Laboratory Data: Lab Results  Component Value Date   WBC 13.2 (H) 12/02/2021   HGB 9.8 (L) 12/02/2021    HCT 29.8 (L) 12/02/2021   MCV 93.4 12/02/2021   PLT 198 12/02/2021    Lab Results  Component Value Date   CREATININE 1.71 (H) 12/02/2021    [facility] notes reviewed  Urinalysis     Assessment:  1.  Persistent microscopic hematuria, occurring well over 30 to 40 years.  History of Cytoxan exposure.  Stable hematuria today.  2.  History of hematospermia, benign.  Prostate exam today normal.  Plan: 1.  Urine was sent for cytology today  2.  I would recommend annual visits for urinalysis/cytology-he would prefer to do this here  There are no diagnoses linked to this encounter.  No follow-ups on file.  Dillon CHRISTELLA Shack, MD  [facility] [place]

## 2024-08-11 ENCOUNTER — Ambulatory Visit: Admitting: Urology

## 2024-08-11 VITALS — BP 124/72 | HR 73

## 2024-08-11 DIAGNOSIS — Z87438 Personal history of other diseases of male genital organs: Secondary | ICD-10-CM | POA: Diagnosis not present

## 2024-08-11 DIAGNOSIS — R3129 Other microscopic hematuria: Secondary | ICD-10-CM | POA: Diagnosis not present

## 2024-08-11 DIAGNOSIS — R361 Hematospermia: Secondary | ICD-10-CM

## 2024-08-11 LAB — URINALYSIS, ROUTINE W REFLEX MICROSCOPIC
Bilirubin, UA: NEGATIVE
Glucose, UA: NEGATIVE
Ketones, UA: NEGATIVE
Nitrite, UA: NEGATIVE
Specific Gravity, UA: 1.025 (ref 1.005–1.030)
Urobilinogen, Ur: 0.2 mg/dL (ref 0.2–1.0)
pH, UA: 6 (ref 5.0–7.5)

## 2024-08-11 LAB — MICROSCOPIC EXAMINATION

## 2024-08-12 LAB — CYTOLOGY, URINE

## 2024-08-13 ENCOUNTER — Ambulatory Visit: Payer: Self-pay

## 2024-08-13 NOTE — Telephone Encounter (Signed)
 Called pt to give cytology results per MD Dahlstedt. Pt voiced his understanding

## 2024-08-13 NOTE — Telephone Encounter (Signed)
-----   Message from [name] sent at 08/13/2024 12:40 PM EDT ----- Call patient-urine cytology test was normal ----- Message ----- From: [name] Lab Results In Sent: 08/11/2024   3:35 PM EDT To: [name], MD

## 2024-09-08 ENCOUNTER — Ambulatory Visit: Attending: Cardiology | Admitting: *Deleted

## 2024-09-08 DIAGNOSIS — I482 Chronic atrial fibrillation, unspecified: Secondary | ICD-10-CM | POA: Diagnosis not present

## 2024-09-08 DIAGNOSIS — N1831 Chronic kidney disease, stage 3a: Secondary | ICD-10-CM | POA: Diagnosis not present

## 2024-09-08 LAB — POCT INR: INR: 2.3 (ref 2.0–3.0)

## 2024-09-08 NOTE — Progress Notes (Signed)
 INR-2.3; Please see anticoagulation encounter

## 2024-09-08 NOTE — Patient Instructions (Signed)
 Continue warfarin 1/2 tablet daily  Continue greens/salads Recheck in 8 weeks per patient

## 2024-09-30 ENCOUNTER — Other Ambulatory Visit: Payer: Self-pay | Admitting: Internal Medicine

## 2024-09-30 DIAGNOSIS — Z5181 Encounter for therapeutic drug level monitoring: Secondary | ICD-10-CM

## 2024-09-30 DIAGNOSIS — I482 Chronic atrial fibrillation, unspecified: Secondary | ICD-10-CM

## 2024-09-30 NOTE — Telephone Encounter (Signed)
 Refill request for warfarin:  Last INR was 2.3 Next INR due 11/03/24 LOV was 04/21/24  Refill approved.

## 2024-10-27 ENCOUNTER — Ambulatory Visit: Admitting: Primary Care

## 2024-10-27 ENCOUNTER — Telehealth: Payer: Self-pay | Admitting: Primary Care

## 2024-10-27 DIAGNOSIS — G4733 Obstructive sleep apnea (adult) (pediatric): Secondary | ICD-10-CM

## 2024-10-27 NOTE — Telephone Encounter (Signed)
 Patient needs a prescription for a new mask. He was scheduled for an appointment today but it had to be rescheduled due to the provider being out. If possible please give patient a call if a mask can be order for him.  Patient can be reached at [phone].

## 2024-10-27 NOTE — Telephone Encounter (Signed)
 I called and spoke to pt's wife, Rojelio. DPR.   Pt was suppose to be seen by BW today and this was cancelled due to provider not in office. We have not seen pt in over a year, but I informed the wife that I would still try and place an order in hopes insurance will still pass it through but I could not guarantee this. Pt wife verbalized understanding. NFN

## 2024-11-03 ENCOUNTER — Ambulatory Visit: Attending: Cardiology | Admitting: *Deleted

## 2024-11-03 DIAGNOSIS — I482 Chronic atrial fibrillation, unspecified: Secondary | ICD-10-CM

## 2024-11-03 DIAGNOSIS — N1831 Chronic kidney disease, stage 3a: Secondary | ICD-10-CM | POA: Diagnosis not present

## 2024-11-03 LAB — POCT INR: INR: 2.1 (ref 2.0–3.0)

## 2024-11-03 NOTE — Progress Notes (Signed)
 INR 2.1; Please see anticoagulation encounter

## 2024-11-03 NOTE — Patient Instructions (Signed)
 Continue warfarin 1/2 tablet daily  Continue greens/salads Recheck in 8 weeks per patient

## 2024-11-10 ENCOUNTER — Encounter: Payer: Self-pay | Admitting: Primary Care

## 2024-11-10 ENCOUNTER — Ambulatory Visit: Admitting: Primary Care

## 2024-11-10 VITALS — BP 126/60 | HR 72 | Temp 97.9°F | Ht 67.5 in | Wt 240.6 lb

## 2024-11-10 DIAGNOSIS — G4733 Obstructive sleep apnea (adult) (pediatric): Secondary | ICD-10-CM | POA: Diagnosis not present

## 2024-11-10 NOTE — Patient Instructions (Signed)
 Great seeing you today Dillon Strickland    Change Pressure 12-15cm h20 Renew CPAP supplies x 1 year Aim to wear your CPAP nightly Look into insurance coverage for zepbound and let me or your PCP know if you want prescription sent to pharmacy  Follow-up 1 year with [facility] NP or sooner if needed   Notify your provider if you are planning to have a procedure/surgery, as this medication will need to be stopped prior.

## 2024-11-10 NOTE — Progress Notes (Signed)
 @Patient  ID: Dillon Strickland, male    DOB: 1951/04/19, 73 y.o.   MRN: [id number]  No chief complaint on file.   Referring provider: Sheryle Carwin, MD  HPI: 73 year old male, never smoked.  Past medical history significant for chronic A-fib on anticoagulation, hyperlipidemia, gout.  Previous [facility] pulmonary encounter:  09/11/2022 Patient presents today for sleep consult. He has symptoms of snoring and significant daytime sleepiness. He reports being extremley tired during the day. He has not felt like doing anything the last year. His mood is not depressed. He had sleep study on 08/21/22 through [facility] that showed he had severe sleep apnea, AHI 65/hour with Spo2 low 72% (99%). Weight 240lbs. We reviewed treatment options. He is interested in starting CPAP. He plans on working on weight loss. He would like to use [employer]. His wife has parkinson's psychosis, she is a therapist, sports for [employer]. Sleep apnea runs in his family. Denies symptoms of narcolepsy, cataplexy or sleepwalking.  Sleep questionnaire Symptoms-   Snoring  Prior sleep study- 08/21/22 >> severe OSA, AHI 65/hr  Bedtime- 10pm Time to fall asleep- 15 mins Nocturnal awakenings- 3-4 times Out of bed/start of day- 7am Weight changes- No Do you operate heavy machinery- No Do you currently wear CPAP- No Do you current wear oxygen- No Epworth- 7  10/29/2022 Patient presents today for follow-up sleep apnea. HST 08/21/22 showed severe OSA, AHI 65/hour. Started on CPAP in October 2023, he purchased his CPAP machine and was able to get reimbursed. He is feeling better. He reports more energy. He feels rested when he wakes up. He is still snoring. No difficulties falling back to sleep when he wakes up at night. No issues with mask fit or pressure settings. He is using hybrid full face mask. DME company is chartered loss adjust[employer].   Airview download 09/26/22-10/25/22 Usage 28/30 days; 93% > 4 hours Average  usage 6 hours 24 mins Pressure 5-15cm h20 (13.5cm h20-95%) Airleaks 35.5L/min (95%)  AHI 16.1/hour  01/29/2023 Patient presents today for OSA follow-up. He is doing well. Reports significant benefit since starting CPAP. He no longer needs to take a nap during the day and feels more energized. He is experiencing some airleaks from his mask at night, he attributes this to his facial hair/beard. He uses [employer] dream[employer] hybrid full face mask size large. He has not change mask or supplies since he received CPAP in October 2023. DME is chartered loss adjuster.   Airview download 12/29/22-01/27/23 Usage 25/30 days (83%); 19 days (63%) Usage days used 5 hours 16 mins Pressure 8-18cm h20 (12.9cm h20-95%) Airleaks 43.9L/min (95%) AHI 14.2    08/19/2023 Patient presents for OSA follow-up He feels a lot better since last visit. Energy levels are better. He saw foot doctor. He got new sneakers. Leg pain has improved.He is working on weight loss. Wants to get back outside to do some lumbar work. CPAP use has been inconsistent.His wife had a stroke on May 5th. She then had RSV. She had bed sores. He cared for her and at night he has had to sit down stairs with her. He also had covid in August, he has no residual symptoms. He go a new CPAP mask, he is having some air leaks. AHI continues to improve, significant better compared to previous readings.   Airview download 07/17/23-08/15/23 Usage days 23/30 days (77%); 15 days (50%) greater than 4 hours Average usage days used 4 hours 57 minutes Pressure 13 cm H2O Air leaks 33.8 L/min (95%)  AHI 8.3  OSA (obstructive sleep apnea) - Severe OSA, AHI 65/hour. Patient is moderately compliant with CPAP use, recently inconsistent due to wife's health. He reports improvement in energy levels with CPAP use. AHI continues to improve, significantly better compared to previous readings.  Current pressure 13 cm H2O; residual AHI 8.3/hour.  No changes recommended today.  Encourage  patient aim to wear CPAP nightly for 4 to 6 hours or longer. Continue weight loss efforts and side sleeping position.  Advised against driving if experiencing excessive daytime sleepiness fatigue.  Follow-up in 6 months or sooner if needed.     11/10/2024- Interim hx  Discussed the use of AI scribe software for clinical note transcription with the patient, who gave verbal consent to proceed.  History of Present Illness Dillon Strickland is a 73 year old male with severe sleep apnea who presents for CPAP supply renewal and pressure adjustment.  He has a history of severe sleep apnea, diagnosed in 2023 with a sleep study showing 65 apneic events per hour. He began using a CPAP machine in October 2023, with the current pressure set at 13 cm H2O. There has been some improvement in symptoms, with a reduction to 9.6 events per hour. However, he continues to experience fatigue and air leaks from his mask, which he attributes to a broken mask that occurred two months ago.  His sleep quality has improved since starting CPAP therapy, although he does not feel significantly more energetic. He notes improved exercise tolerance, as he no longer feels 'like I'm gonna drop dead' when walking uphill. He maintains his CPAP machine regularly, cleaning the filter and other components.  He mentions knee pain, particularly in one knee, which he describes as intermittent and affecting his ability to bear weight. He has not sustained any specific injury to the knee but notes a general decline in physical capability, which he associates with aging.  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 He wants to maintain his independence and continue participating in activities he enjoys, despite his health challenges.       Allergies  Allergen Reactions   Uloric  [Febuxostat] Rash    Immunization History  Administered Date(s) Administered   Influenza Split 09/25/2016    Past Medical History:  Diagnosis Date   Chronic anticoagulation    Colonoscopy with polypectomy in 2006; due for a repeat procedure as of 2012   Chronic atrial fibrillation (HCC) 2001   Spontaneous contrast on TEE   Degenerative joint disease of spine    Mild; cervical and lumbosacral   Gout    Hyperlipidemia    Overweight(278.02)    Pseudogout     Tobacco History: Social History   Tobacco Use  Smoking Status Never   Passive exposure: Past  Smokeless Tobacco Never   Counseling given: Not Answered   Outpatient Medications Prior to Visit  Medication Sig Dispense Refill   allopurinol  (ZYLOPRIM ) 300 MG tablet Take 300 mg by mouth daily.     atenolol  (TENORMIN ) 50 MG tablet TAKE ONE TABLET BY MOUTH DAILY. 90 tablet 3   atorvastatin  (LIPITOR) 20 MG tablet Take 20 mg by mouth daily.     diltiazem  (CARTIA  XT) 240 MG 24 hr capsule Take 1 capsule (240 mg total) by mouth daily. 30 capsule 1   levothyroxine (SYNTHROID) 50 MCG tablet Take 50 mcg by mouth  every morning.     lisinopril (ZESTRIL) 2.5 MG tablet Take 2.5 mg by mouth daily.     predniSONE  (DELTASONE ) 10 MG tablet Take 10 mg by mouth as needed. (Patient not taking: Reported on 08/11/2024)     warfarin (COUMADIN ) 5 MG tablet TAKE 1/2 TABLET BY MOUTH DAILY OR AS DIRECTED BY COUMADIN  CLINIC. 30 tablet 5   No facility-administered medications prior to visit.   Review of Systems  Review of Systems  Constitutional:  Positive for fatigue.  Respiratory: Negative.    Psychiatric/Behavioral: Negative.     Physical Exam  There were no vitals taken for this visit. Physical Exam Constitutional:      General: He is not in acute distress.    Appearance: Normal appearance. He is not ill-appearing.  HENT:     Head: Normocephalic and atraumatic.  Cardiovascular:     Rate and Rhythm: Normal rate and regular rhythm.   Pulmonary:     Effort: Pulmonary effort is normal.     Breath sounds: Normal breath sounds.  Musculoskeletal:        General: Normal range of motion.  Skin:    General: Skin is warm and dry.  Neurological:     General: No focal deficit present.     Mental Status: He is alert and oriented to person, place, and time. Mental status is at baseline.  Psychiatric:        Mood and Affect: Mood normal.        Behavior: Behavior normal.        Thought Content: Thought content normal.        Judgment: Judgment normal.      Lab Results:  CBC    Component Value Date/Time   WBC 13.2 (H) 12/02/2021 0452   RBC 3.19 (L) 12/02/2021 0452   HGB 9.8 (L) 12/02/2021 0452   HCT 29.8 (L) 12/02/2021 0452   PLT 198 12/02/2021 0452   MCV 93.4 12/02/2021 0452   MCH 30.7 12/02/2021 0452   MCHC 32.9 12/02/2021 0452   RDW 15.0 12/02/2021 0452   LYMPHSABS 0.3 (L) 11/28/2014 2314   MONOABS 0.3 11/28/2014 2314   EOSABS 0.0 11/28/2014 2314   BASOSABS 0.0 11/28/2014 2314    BMET    Component Value Date/Time   NA 136 12/02/2021 0452   K 4.3 12/02/2021 0452   CL 105 12/02/2021 0452   CO2 25 12/02/2021 0452   GLUCOSE 111 (H) 12/02/2021 0452   BUN 43 (H) 12/02/2021 0452   CREATININE 1.71 (H) 12/02/2021 0452   CREATININE 0.92 02/05/2012 1419   CALCIUM  8.2 (L) 12/02/2021 0452   GFRNONAA 43 (L) 12/02/2021 0452   GFRAA 38 (L) 11/28/2014 2314    BNP No results found for: BNP  ProBNP No results found for: PROBNP  Imaging: No results found.   Assessment & Plan:   1. OSA (obstructive sleep apnea) (Primary)  Assessment and Plan Assessment & Plan Severe obstructive sleep apnea Diagnosed in 2023 with 65 apneic events per hour. Currently on CPAP therapy since October 2023, patient purchased machine out of pocket. DME for CPAP supplies is chartered loss adjuster. Current pressure setting is 13 cm H2O, with an apnea score of 9.6 events per hour, indicating mild residual apnea. Reports improved  sleep quality but still experiences fatigue and occasional air leaks, likely due to mask fit issues.  - Renewed CPAP supplies prescription for 1 year. - Adjusted CPAP pressure settings to auto settings between 12 to 15 cm H2O. - Scheduled  annual follow-up for CPAP management.  Obesity BMI of 37. Reports weight gain from 230 to 240 pounds, contributing to knee pain. No hx diabetes or thyroid  cancer. Discussed potential use of Zepbound for weight management, which is approved for obesity and sleep apnea. Side effects include nausea, vomiting, diarrhea, and constipation. Cost considerations and insurance coverage discussed, with potential cost reduction in April. Patient education on zepbound was provided. - Provided form to check insurance coverage for Zepbound. - Instructed to send message via MyChart if prescription is desired after checking coverage.  Recording duration: 19 minutes   Almarie LELON Ferrari, NP 11/10/2024

## 2024-12-28 ENCOUNTER — Other Ambulatory Visit: Payer: Self-pay | Admitting: Cardiology

## 2024-12-29 ENCOUNTER — Ambulatory Visit

## 2025-01-07 ENCOUNTER — Ambulatory Visit
